# Patient Record
Sex: Female | Born: 1955
Health system: Southern US, Community
[De-identification: ages and names within clinical notes are randomized; demographics above are authoritative.]

## PROBLEM LIST (undated history)

## (undated) DIAGNOSIS — I10 Essential (primary) hypertension: Secondary | ICD-10-CM

## (undated) DIAGNOSIS — J302 Other seasonal allergic rhinitis: Secondary | ICD-10-CM

## (undated) DIAGNOSIS — I471 Supraventricular tachycardia, unspecified: Secondary | ICD-10-CM

## (undated) DIAGNOSIS — E669 Obesity, unspecified: Secondary | ICD-10-CM

## (undated) DIAGNOSIS — E119 Type 2 diabetes mellitus without complications: Secondary | ICD-10-CM

## (undated) DIAGNOSIS — E785 Hyperlipidemia, unspecified: Secondary | ICD-10-CM

## (undated) HISTORY — PX: SHOULDER SURGERY: SHX246

## (undated) HISTORY — DX: Other seasonal allergic rhinitis: J30.2

## (undated) HISTORY — DX: Type 2 diabetes mellitus without complications: E11.9

## (undated) HISTORY — DX: Hyperlipidemia, unspecified: E78.5

## (undated) HISTORY — DX: Obesity, unspecified: E66.9

## (undated) HISTORY — DX: Essential (primary) hypertension: I10

## (undated) HISTORY — DX: Supraventricular tachycardia, unspecified: I47.10

## (undated) HISTORY — DX: Supraventricular tachycardia: I47.1

---

## 2001-04-16 ENCOUNTER — Ambulatory Visit (HOSPITAL_COMMUNITY): Admission: RE | Admit: 2001-04-16 | Discharge: 2001-04-16 | Payer: Self-pay | Admitting: General Surgery

## 2001-04-16 ENCOUNTER — Encounter: Payer: Self-pay | Admitting: Orthopedic Surgery

## 2001-04-16 ENCOUNTER — Encounter: Payer: Self-pay | Admitting: General Surgery

## 2002-06-15 ENCOUNTER — Ambulatory Visit (HOSPITAL_COMMUNITY): Admission: RE | Admit: 2002-06-15 | Discharge: 2002-06-15 | Payer: Self-pay | Admitting: General Surgery

## 2002-06-15 ENCOUNTER — Encounter: Payer: Self-pay | Admitting: General Surgery

## 2002-12-01 HISTORY — PX: ABDOMINAL HYSTERECTOMY: SHX81

## 2003-03-07 ENCOUNTER — Encounter: Payer: Self-pay | Admitting: Family Medicine

## 2003-03-07 ENCOUNTER — Observation Stay (HOSPITAL_COMMUNITY): Admission: RE | Admit: 2003-03-07 | Discharge: 2003-03-08 | Payer: Self-pay | Admitting: Family Medicine

## 2003-03-08 ENCOUNTER — Encounter: Payer: Self-pay | Admitting: *Deleted

## 2003-10-19 ENCOUNTER — Ambulatory Visit (HOSPITAL_COMMUNITY): Admission: RE | Admit: 2003-10-19 | Discharge: 2003-10-19 | Payer: Self-pay | Admitting: Orthopedic Surgery

## 2003-12-02 DIAGNOSIS — E785 Hyperlipidemia, unspecified: Secondary | ICD-10-CM

## 2003-12-02 DIAGNOSIS — I10 Essential (primary) hypertension: Secondary | ICD-10-CM

## 2003-12-02 HISTORY — DX: Hyperlipidemia, unspecified: E78.5

## 2003-12-02 HISTORY — DX: Essential (primary) hypertension: I10

## 2004-06-25 ENCOUNTER — Ambulatory Visit (HOSPITAL_COMMUNITY): Admission: RE | Admit: 2004-06-25 | Discharge: 2004-06-25 | Payer: Self-pay | Admitting: Family Medicine

## 2005-01-13 ENCOUNTER — Ambulatory Visit: Payer: Self-pay | Admitting: Family Medicine

## 2005-01-14 ENCOUNTER — Ambulatory Visit (HOSPITAL_COMMUNITY): Admission: RE | Admit: 2005-01-14 | Discharge: 2005-01-14 | Payer: Self-pay | Admitting: Family Medicine

## 2005-03-13 ENCOUNTER — Ambulatory Visit (HOSPITAL_COMMUNITY): Admission: RE | Admit: 2005-03-13 | Discharge: 2005-03-13 | Payer: Self-pay | Admitting: General Surgery

## 2005-05-05 ENCOUNTER — Ambulatory Visit: Payer: Self-pay | Admitting: Family Medicine

## 2005-07-21 ENCOUNTER — Ambulatory Visit: Payer: Self-pay | Admitting: Family Medicine

## 2005-07-22 ENCOUNTER — Ambulatory Visit (HOSPITAL_COMMUNITY): Admission: RE | Admit: 2005-07-22 | Discharge: 2005-07-22 | Payer: Self-pay | Admitting: Family Medicine

## 2005-08-06 ENCOUNTER — Ambulatory Visit (HOSPITAL_COMMUNITY): Admission: RE | Admit: 2005-08-06 | Discharge: 2005-08-06 | Payer: Self-pay | Admitting: Family Medicine

## 2005-09-15 ENCOUNTER — Ambulatory Visit: Payer: Self-pay | Admitting: Family Medicine

## 2006-02-03 ENCOUNTER — Ambulatory Visit: Payer: Self-pay | Admitting: Family Medicine

## 2006-04-29 ENCOUNTER — Ambulatory Visit (HOSPITAL_COMMUNITY): Admission: RE | Admit: 2006-04-29 | Discharge: 2006-04-29 | Payer: Self-pay | Admitting: Family Medicine

## 2006-04-30 ENCOUNTER — Ambulatory Visit: Payer: Self-pay | Admitting: Family Medicine

## 2006-05-01 ENCOUNTER — Ambulatory Visit (HOSPITAL_COMMUNITY): Admission: RE | Admit: 2006-05-01 | Discharge: 2006-05-01 | Payer: Self-pay | Admitting: Family Medicine

## 2006-06-04 ENCOUNTER — Ambulatory Visit: Payer: Self-pay | Admitting: Family Medicine

## 2006-07-21 ENCOUNTER — Encounter (HOSPITAL_COMMUNITY): Admission: RE | Admit: 2006-07-21 | Discharge: 2006-08-20 | Payer: Self-pay | Admitting: Family Medicine

## 2006-07-24 ENCOUNTER — Ambulatory Visit: Payer: Self-pay | Admitting: Family Medicine

## 2006-10-06 ENCOUNTER — Ambulatory Visit: Payer: Self-pay | Admitting: Family Medicine

## 2006-11-17 ENCOUNTER — Ambulatory Visit: Payer: Self-pay | Admitting: Family Medicine

## 2006-11-18 ENCOUNTER — Ambulatory Visit (HOSPITAL_COMMUNITY): Admission: RE | Admit: 2006-11-18 | Discharge: 2006-11-18 | Payer: Self-pay | Admitting: Family Medicine

## 2007-02-16 ENCOUNTER — Encounter: Payer: Self-pay | Admitting: Family Medicine

## 2007-02-16 LAB — CONVERTED CEMR LAB
ALT: 24 units/L (ref 0–35)
AST: 22 units/L (ref 0–37)
Albumin: 4.4 g/dL (ref 3.5–5.2)
Alkaline Phosphatase: 93 units/L (ref 39–117)
CO2: 24 meq/L (ref 19–32)
Calcium: 9.8 mg/dL (ref 8.4–10.5)
Chloride: 106 meq/L (ref 96–112)
Cholesterol: 272 mg/dL — ABNORMAL HIGH (ref 0–200)
Creatinine, Ser: 0.74 mg/dL (ref 0.40–1.20)
Glucose, Bld: 104 mg/dL — ABNORMAL HIGH (ref 70–99)
HDL: 72 mg/dL (ref >39)
Potassium: 3.8 meq/L (ref 3.5–5.3)
Total Protein: 7.6 g/dL (ref 6.0–8.3)
Triglycerides: 87 mg/dL (ref <150)

## 2007-02-17 ENCOUNTER — Ambulatory Visit: Payer: Self-pay | Admitting: Family Medicine

## 2007-03-29 ENCOUNTER — Ambulatory Visit: Payer: Self-pay | Admitting: Family Medicine

## 2007-03-30 ENCOUNTER — Ambulatory Visit (HOSPITAL_COMMUNITY): Admission: RE | Admit: 2007-03-30 | Discharge: 2007-03-30 | Payer: Self-pay | Admitting: Family Medicine

## 2007-07-07 ENCOUNTER — Encounter: Payer: Self-pay | Admitting: Family Medicine

## 2007-07-07 ENCOUNTER — Ambulatory Visit: Payer: Self-pay | Admitting: Family Medicine

## 2007-07-07 ENCOUNTER — Other Ambulatory Visit: Admission: RE | Admit: 2007-07-07 | Discharge: 2007-07-07 | Payer: Self-pay | Admitting: Family Medicine

## 2007-07-07 LAB — CONVERTED CEMR LAB
ALT: 26 units/L (ref 0–35)
AST: 22 units/L (ref 0–37)
Albumin: 4.5 g/dL (ref 3.5–5.2)
Alkaline Phosphatase: 96 units/L (ref 39–117)
BUN: 14 mg/dL (ref 6–23)
Basophils Absolute: 0.1 10*3/uL (ref 0.0–0.1)
Basophils Relative: 1 % (ref 0–1)
Bilirubin, Direct: 0.1 mg/dL (ref 0.0–0.3)
CO2: 26 meq/L (ref 19–32)
Calcium: 9.7 mg/dL (ref 8.4–10.5)
Chloride: 104 meq/L (ref 96–112)
Cholesterol: 155 mg/dL (ref 0–200)
Creatinine, Ser: 0.92 mg/dL (ref 0.40–1.20)
Eosinophils Absolute: 0.4 10*3/uL (ref 0.0–0.7)
Eosinophils Relative: 4 % (ref 0–5)
Glucose, Bld: 111 mg/dL — ABNORMAL HIGH (ref 70–99)
HCT: 38.6 % (ref 36.0–46.0)
HDL: 64 mg/dL (ref >39)
Hemoglobin: 12.1 g/dL (ref 12.0–15.0)
Indirect Bilirubin: 0.5 mg/dL (ref 0.0–0.9)
LDL Cholesterol: 72 mg/dL (ref 0–99)
Lymphocytes Relative: 34 % (ref 12–46)
Lymphs Abs: 3.1 10*3/uL (ref 0.7–3.3)
MCHC: 31.3 g/dL (ref 30.0–36.0)
MCV: 89.8 fL (ref 78.0–100.0)
Monocytes Absolute: 0.5 10*3/uL (ref 0.2–0.7)
Monocytes Relative: 6 % (ref 3–11)
Neutro Abs: 4.9 10*3/uL (ref 1.7–7.7)
Neutrophils Relative %: 55 % (ref 43–77)
Platelets: 429 10*3/uL — ABNORMAL HIGH (ref 150–400)
Potassium: 3.8 meq/L (ref 3.5–5.3)
RBC: 4.3 M/uL (ref 3.87–5.11)
RDW: 16.1 % — ABNORMAL HIGH (ref 11.5–14.0)
Sodium: 141 meq/L (ref 135–145)
TSH: 2.82 microintl units/mL (ref 0.350–5.50)
Total Bilirubin: 0.6 mg/dL (ref 0.3–1.2)
Total CHOL/HDL Ratio: 2.4
Total Protein: 7.7 g/dL (ref 6.0–8.3)
Triglycerides: 94 mg/dL (ref <150)
VLDL: 19 mg/dL (ref 0–40)
WBC: 8.9 10*3/uL (ref 4.0–10.5)

## 2007-11-10 ENCOUNTER — Ambulatory Visit: Payer: Self-pay | Admitting: Family Medicine

## 2007-12-13 ENCOUNTER — Ambulatory Visit (HOSPITAL_COMMUNITY): Admission: RE | Admit: 2007-12-13 | Discharge: 2007-12-13 | Payer: Self-pay | Admitting: Family Medicine

## 2008-02-08 ENCOUNTER — Ambulatory Visit: Payer: Self-pay | Admitting: Family Medicine

## 2008-02-08 LAB — CONVERTED CEMR LAB
ALT: 18 units/L (ref 0–35)
AST: 18 units/L (ref 0–37)
Albumin: 4.9 g/dL (ref 3.5–5.2)
Alkaline Phosphatase: 112 units/L (ref 39–117)
Calcium: 9.5 mg/dL (ref 8.4–10.5)
Chloride: 102 meq/L (ref 96–112)
Cholesterol: 174 mg/dL (ref 0–200)
Creatinine, Ser: 0.87 mg/dL (ref 0.40–1.20)
HDL: 69 mg/dL (ref >39)
Total Protein: 7.9 g/dL (ref 6.0–8.3)
Triglycerides: 78 mg/dL (ref <150)

## 2008-03-15 ENCOUNTER — Inpatient Hospital Stay (HOSPITAL_COMMUNITY): Admission: EM | Admit: 2008-03-15 | Discharge: 2008-03-17 | Payer: Self-pay | Admitting: Emergency Medicine

## 2008-03-16 ENCOUNTER — Encounter (INDEPENDENT_AMBULATORY_CARE_PROVIDER_SITE_OTHER): Payer: Self-pay | Admitting: Family Medicine

## 2008-03-17 ENCOUNTER — Ambulatory Visit (HOSPITAL_COMMUNITY): Admission: AD | Admit: 2008-03-17 | Discharge: 2008-03-17 | Payer: Self-pay | Admitting: Cardiovascular Disease

## 2008-03-22 ENCOUNTER — Ambulatory Visit: Payer: Self-pay | Admitting: Family Medicine

## 2008-03-28 ENCOUNTER — Encounter: Payer: Self-pay | Admitting: Family Medicine

## 2008-03-29 DIAGNOSIS — J309 Allergic rhinitis, unspecified: Secondary | ICD-10-CM | POA: Insufficient documentation

## 2008-03-29 DIAGNOSIS — I10 Essential (primary) hypertension: Secondary | ICD-10-CM

## 2008-03-30 ENCOUNTER — Encounter: Payer: Self-pay | Admitting: Family Medicine

## 2008-04-04 ENCOUNTER — Ambulatory Visit (HOSPITAL_COMMUNITY): Admission: RE | Admit: 2008-04-04 | Discharge: 2008-04-04 | Payer: Self-pay | Admitting: General Surgery

## 2008-05-12 ENCOUNTER — Ambulatory Visit: Payer: Self-pay | Admitting: Family Medicine

## 2008-05-13 ENCOUNTER — Encounter: Payer: Self-pay | Admitting: Family Medicine

## 2008-08-16 ENCOUNTER — Other Ambulatory Visit: Admission: RE | Admit: 2008-08-16 | Discharge: 2008-08-16 | Payer: Self-pay | Admitting: Family Medicine

## 2008-08-16 ENCOUNTER — Ambulatory Visit: Payer: Self-pay | Admitting: Family Medicine

## 2008-08-16 ENCOUNTER — Encounter: Payer: Self-pay | Admitting: Family Medicine

## 2008-08-16 LAB — CONVERTED CEMR LAB: Pap Smear: NORMAL

## 2008-09-22 ENCOUNTER — Encounter: Payer: Self-pay | Admitting: Family Medicine

## 2008-11-01 ENCOUNTER — Telehealth: Payer: Self-pay | Admitting: Family Medicine

## 2008-12-01 DIAGNOSIS — E119 Type 2 diabetes mellitus without complications: Secondary | ICD-10-CM

## 2008-12-01 HISTORY — DX: Type 2 diabetes mellitus without complications: E11.9

## 2008-12-22 ENCOUNTER — Telehealth: Payer: Self-pay | Admitting: Family Medicine

## 2008-12-22 ENCOUNTER — Encounter: Payer: Self-pay | Admitting: Family Medicine

## 2008-12-22 LAB — CONVERTED CEMR LAB
AST: 20 units/L (ref 0–37)
Alkaline Phosphatase: 98 units/L (ref 39–117)
Bilirubin, Direct: 0.1 mg/dL (ref 0.0–0.3)
CO2: 24 meq/L (ref 19–32)
Calcium: 9.7 mg/dL (ref 8.4–10.5)
Creatinine, Ser: 0.77 mg/dL (ref 0.40–1.20)
Glucose, Bld: 86 mg/dL (ref 70–99)
LDL Cholesterol: 106 mg/dL — ABNORMAL HIGH (ref 0–99)
Total Bilirubin: 0.6 mg/dL (ref 0.3–1.2)
Total CHOL/HDL Ratio: 3

## 2008-12-25 ENCOUNTER — Ambulatory Visit (HOSPITAL_COMMUNITY): Admission: RE | Admit: 2008-12-25 | Discharge: 2008-12-25 | Payer: Self-pay | Admitting: Family Medicine

## 2008-12-25 ENCOUNTER — Ambulatory Visit: Payer: Self-pay | Admitting: Family Medicine

## 2008-12-25 DIAGNOSIS — M25569 Pain in unspecified knee: Secondary | ICD-10-CM

## 2008-12-25 DIAGNOSIS — R5383 Other fatigue: Secondary | ICD-10-CM

## 2008-12-25 DIAGNOSIS — R5381 Other malaise: Secondary | ICD-10-CM | POA: Insufficient documentation

## 2008-12-26 ENCOUNTER — Telehealth: Payer: Self-pay | Admitting: Family Medicine

## 2009-01-22 ENCOUNTER — Telehealth: Payer: Self-pay | Admitting: Family Medicine

## 2009-02-14 ENCOUNTER — Ambulatory Visit: Payer: Self-pay | Admitting: Orthopedic Surgery

## 2009-02-14 DIAGNOSIS — M549 Dorsalgia, unspecified: Secondary | ICD-10-CM | POA: Insufficient documentation

## 2009-02-15 ENCOUNTER — Encounter: Payer: Self-pay | Admitting: Orthopedic Surgery

## 2009-02-28 ENCOUNTER — Ambulatory Visit: Payer: Self-pay | Admitting: Family Medicine

## 2009-03-04 DIAGNOSIS — E785 Hyperlipidemia, unspecified: Secondary | ICD-10-CM | POA: Insufficient documentation

## 2009-03-28 ENCOUNTER — Ambulatory Visit: Payer: Self-pay | Admitting: Orthopedic Surgery

## 2009-05-02 ENCOUNTER — Ambulatory Visit: Payer: Self-pay | Admitting: Family Medicine

## 2009-05-04 LAB — CONVERTED CEMR LAB
ALT: 25 U/L
AST: 16 U/L
Albumin: 4.2 g/dL
Alkaline Phosphatase: 91 U/L
BUN: 12 mg/dL
Bilirubin, Direct: 0.1 mg/dL
CO2: 27 meq/L
Calcium: 9.5 mg/dL
Chloride: 107 meq/L
Cholesterol: 195 mg/dL
Creatinine, Ser: 0.76 mg/dL
Glucose, Bld: 103 mg/dL — ABNORMAL HIGH
HDL: 64 mg/dL
Indirect Bilirubin: 0.3 mg/dL
LDL Cholesterol: 116 mg/dL — ABNORMAL HIGH
Potassium: 4.1 meq/L
Sodium: 144 meq/L
Total Bilirubin: 0.4 mg/dL
Total CHOL/HDL Ratio: 3
Total Protein: 7 g/dL
Triglycerides: 75 mg/dL
VLDL: 15 mg/dL

## 2009-07-04 ENCOUNTER — Ambulatory Visit: Payer: Self-pay | Admitting: Family Medicine

## 2009-07-05 ENCOUNTER — Encounter: Payer: Self-pay | Admitting: Family Medicine

## 2009-07-06 LAB — CONVERTED CEMR LAB
Albumin: 4.3 g/dL (ref 3.5–5.2)
Alkaline Phosphatase: 104 units/L (ref 39–117)
BUN: 13 mg/dL (ref 6–23)
CO2: 27 meq/L (ref 19–32)
Chloride: 103 meq/L (ref 96–112)
Creatinine, Ser: 0.94 mg/dL (ref 0.40–1.20)
Eosinophils Absolute: 0.4 10*3/uL (ref 0.0–0.7)
Eosinophils Relative: 6 % — ABNORMAL HIGH (ref 0–5)
Glucose, Bld: 111 mg/dL — ABNORMAL HIGH (ref 70–99)
HCT: 36.9 % (ref 36.0–46.0)
Hemoglobin: 11.6 g/dL — ABNORMAL LOW (ref 12.0–15.0)
Hgb A1c MFr Bld: 6.7 % — ABNORMAL HIGH (ref 4.6–6.1)
Indirect Bilirubin: 0.4 mg/dL (ref 0.0–0.9)
LDL Cholesterol: 92 mg/dL (ref 0–99)
Lymphocytes Relative: 30 % (ref 12–46)
Lymphs Abs: 2.4 10*3/uL (ref 0.7–4.0)
MCV: 90.9 fL (ref 78.0–100.0)
Monocytes Absolute: 0.5 10*3/uL (ref 0.1–1.0)
Monocytes Relative: 7 % (ref 3–12)
Platelets: 415 10*3/uL — ABNORMAL HIGH (ref 150–400)
Potassium: 4.3 meq/L (ref 3.5–5.3)
RBC: 4.06 M/uL (ref 3.87–5.11)
Total Bilirubin: 0.5 mg/dL (ref 0.3–1.2)
Total Protein: 7.2 g/dL (ref 6.0–8.3)
VLDL: 10 mg/dL (ref 0–40)
WBC: 7.8 10*3/uL (ref 4.0–10.5)

## 2009-07-09 ENCOUNTER — Ambulatory Visit (HOSPITAL_COMMUNITY): Admission: RE | Admit: 2009-07-09 | Discharge: 2009-07-09 | Payer: Self-pay | Admitting: Family Medicine

## 2009-07-11 ENCOUNTER — Encounter: Payer: Self-pay | Admitting: Family Medicine

## 2009-07-12 LAB — CONVERTED CEMR LAB
Glucose, 2 hour: 175 mg/dL — ABNORMAL HIGH (ref 70–139)
Glucose, Fasting: 100 mg/dL — ABNORMAL HIGH (ref 70–99)

## 2009-08-22 ENCOUNTER — Ambulatory Visit: Payer: Self-pay | Admitting: Family Medicine

## 2009-08-22 DIAGNOSIS — E1169 Type 2 diabetes mellitus with other specified complication: Secondary | ICD-10-CM | POA: Insufficient documentation

## 2009-08-22 DIAGNOSIS — E1159 Type 2 diabetes mellitus with other circulatory complications: Secondary | ICD-10-CM | POA: Insufficient documentation

## 2009-10-03 ENCOUNTER — Ambulatory Visit: Payer: Self-pay | Admitting: Family Medicine

## 2009-10-03 DIAGNOSIS — N3000 Acute cystitis without hematuria: Secondary | ICD-10-CM

## 2009-10-03 LAB — CONVERTED CEMR LAB
Glucose, Urine, Semiquant: NEGATIVE
Nitrite: NEGATIVE
Protein, U semiquant: >=300
pH: 7

## 2009-10-08 ENCOUNTER — Encounter: Payer: Self-pay | Admitting: Family Medicine

## 2009-12-19 ENCOUNTER — Telehealth: Payer: Self-pay | Admitting: Family Medicine

## 2009-12-26 ENCOUNTER — Ambulatory Visit: Payer: Self-pay | Admitting: Family Medicine

## 2009-12-26 LAB — CONVERTED CEMR LAB
Glucose, Bld: 115 mg/dL
Hgb A1c MFr Bld: 6.6 %

## 2009-12-27 ENCOUNTER — Encounter: Payer: Self-pay | Admitting: Family Medicine

## 2009-12-27 LAB — CONVERTED CEMR LAB
Albumin: 4.6 g/dL (ref 3.5–5.2)
Alkaline Phosphatase: 104 units/L (ref 39–117)
CO2: 26 meq/L (ref 19–32)
Chloride: 100 meq/L (ref 96–112)
Creatinine, Urine: 153.9 mg/dL
HDL: 63 mg/dL (ref >39)
LDL Cholesterol: 173 mg/dL — ABNORMAL HIGH (ref 0–99)
Microalb Creat Ratio: 3.9 mg/g (ref 0.0–30.0)
Microalb, Ur: 0.6 mg/dL (ref 0.00–1.89)
Potassium: 3.7 meq/L (ref 3.5–5.3)
Sodium: 140 meq/L (ref 135–145)
Total Bilirubin: 0.6 mg/dL (ref 0.3–1.2)
Total CHOL/HDL Ratio: 4.1
Total Protein: 7.6 g/dL (ref 6.0–8.3)
Triglycerides: 102 mg/dL (ref <150)
VLDL: 20 mg/dL (ref 0–40)

## 2010-01-23 ENCOUNTER — Encounter: Admission: RE | Admit: 2010-01-23 | Discharge: 2010-01-23 | Payer: Self-pay | Admitting: Family Medicine

## 2010-03-01 ENCOUNTER — Ambulatory Visit (HOSPITAL_COMMUNITY): Admission: RE | Admit: 2010-03-01 | Discharge: 2010-03-01 | Payer: Self-pay | Admitting: Family Medicine

## 2010-03-12 ENCOUNTER — Encounter: Payer: Self-pay | Admitting: Family Medicine

## 2010-03-29 ENCOUNTER — Telehealth: Payer: Self-pay | Admitting: Family Medicine

## 2010-05-14 ENCOUNTER — Encounter: Admission: RE | Admit: 2010-05-14 | Discharge: 2010-05-14 | Payer: Self-pay | Admitting: Family Medicine

## 2010-07-10 ENCOUNTER — Encounter: Payer: Self-pay | Admitting: Family Medicine

## 2010-07-22 ENCOUNTER — Ambulatory Visit: Payer: Self-pay | Admitting: Family Medicine

## 2010-07-22 DIAGNOSIS — M949 Disorder of cartilage, unspecified: Secondary | ICD-10-CM

## 2010-07-22 DIAGNOSIS — M899 Disorder of bone, unspecified: Secondary | ICD-10-CM | POA: Insufficient documentation

## 2010-07-23 LAB — CONVERTED CEMR LAB
ALT: 24 units/L (ref 0–35)
AST: 18 units/L (ref 0–37)
Albumin: 4.4 g/dL (ref 3.5–5.2)
Basophils Absolute: 0 10*3/uL (ref 0.0–0.1)
CO2: 30 meq/L (ref 19–32)
Calcium: 9.7 mg/dL (ref 8.4–10.5)
Cholesterol: 157 mg/dL (ref 0–200)
Eosinophils Relative: 4 % (ref 0–5)
HCT: 38.7 % (ref 36.0–46.0)
HDL: 56 mg/dL (ref >39)
Lymphocytes Relative: 39 % (ref 12–46)
Neutrophils Relative %: 51 % (ref 43–77)
Platelets: 422 10*3/uL — ABNORMAL HIGH (ref 150–400)
Potassium: 3.8 meq/L (ref 3.5–5.3)
RDW: 15.4 % (ref 11.5–15.5)
Sodium: 142 meq/L (ref 135–145)
TSH: 1.545 microintl units/mL (ref 0.350–4.500)
Total CHOL/HDL Ratio: 2.8

## 2010-07-24 ENCOUNTER — Telehealth: Payer: Self-pay | Admitting: Family Medicine

## 2010-08-16 ENCOUNTER — Telehealth: Payer: Self-pay | Admitting: Family Medicine

## 2010-08-20 ENCOUNTER — Encounter: Admission: RE | Admit: 2010-08-20 | Discharge: 2010-08-29 | Payer: Self-pay | Admitting: Family Medicine

## 2010-10-07 ENCOUNTER — Emergency Department (HOSPITAL_COMMUNITY): Admission: EM | Admit: 2010-10-07 | Discharge: 2010-10-07 | Payer: Self-pay | Admitting: Emergency Medicine

## 2010-10-28 ENCOUNTER — Ambulatory Visit: Payer: Self-pay | Admitting: Family Medicine

## 2010-10-29 ENCOUNTER — Encounter: Payer: Self-pay | Admitting: Family Medicine

## 2010-10-29 DIAGNOSIS — R079 Chest pain, unspecified: Secondary | ICD-10-CM

## 2010-10-30 ENCOUNTER — Ambulatory Visit: Payer: Self-pay | Admitting: Cardiology

## 2010-10-30 ENCOUNTER — Encounter: Payer: Self-pay | Admitting: Family Medicine

## 2010-10-30 LAB — CONVERTED CEMR LAB
ALT: 30 units/L (ref 0–35)
AST: 22 units/L (ref 0–37)
BUN: 11 mg/dL (ref 6–23)
Bilirubin, Direct: 0.1 mg/dL (ref 0.0–0.3)
Calcium: 9.9 mg/dL (ref 8.4–10.5)
Cholesterol: 150 mg/dL (ref 0–200)
Creatinine, Ser: 0.82 mg/dL (ref 0.40–1.20)
Glucose, Bld: 103 mg/dL — ABNORMAL HIGH (ref 70–99)
Indirect Bilirubin: 0.5 mg/dL (ref 0.0–0.9)

## 2010-10-31 ENCOUNTER — Encounter: Payer: Self-pay | Admitting: Cardiology

## 2010-11-27 ENCOUNTER — Encounter: Payer: Self-pay | Admitting: Family Medicine

## 2010-11-27 ENCOUNTER — Telehealth (INDEPENDENT_AMBULATORY_CARE_PROVIDER_SITE_OTHER): Payer: Self-pay | Admitting: *Deleted

## 2010-11-28 ENCOUNTER — Encounter: Payer: Self-pay | Admitting: Family Medicine

## 2010-12-21 ENCOUNTER — Encounter: Payer: Self-pay | Admitting: Family Medicine

## 2010-12-22 ENCOUNTER — Encounter: Payer: Self-pay | Admitting: Family Medicine

## 2011-01-02 NOTE — Progress Notes (Signed)
Summary: rx sent over to [facility]  Phone Note Call from Patient   Summary of Call: has signed up fpr medlink need for you to fax over all her rx to outpatient phar at [facility] they will have it when she goes over there friday you can call her at [phone] Initial call taken by: Lind Guest,  December 19, 2009 1:00 PM  Follow-up for Phone Call        wants to know if avalide can be changed to a generic? Follow-up by: Worthy Keeler LPN,  December 19, 2009 3:36 PM  Additional Follow-up for Phone Call Additional follow up Details #1::        pls send over meds to the out pt pharmacy as requested, for the avalide lotensin 40mg  and HCTZ 25mg  can be substituted one daily of each, wecan try that, send in script for that also if she wishes to try that in place of theavalide Additional Follow-up by: Syliva Overman MD,  December 20, 2009 12:08 PM    Additional Follow-up for Phone Call Additional follow up Details #2::    rx's sent and patient aware of change Follow-up by: Worthy Keeler LPN,  December 20, 2009 3:16 PM  New/Updated Medications: LOTENSIN 40 MG TABS (BENAZEPRIL HCL) one tab by mouth qd HYDROCHLOROTHIAZIDE 25 MG TABS (HYDROCHLOROTHIAZIDE) one tab by mouth qd Prescriptions: CALTRATE 600+D 600-400 MG-UNIT TABS (CALCIUM CARBONATE-VITAMIN D) Take 1 tablet by mouth two times a day  #180 x 0   Entered by:   Worthy Keeler LPN   Authorized by:   Syliva Overman MD   Signed by:   Worthy Keeler LPN on 78/29/5621   Method used:   Electronically to        [facility]* (retail)       [address]       [address] Shipping/mailing       [place], [place]  [address]       Ph: [phone]       Fax: [phone]   RxID:   [id number] CLONIDINE HCL 0.3 MG TABS (CLONIDINE HCL) Take 1 tab by mouth at bedtime  #90 x 0   Entered by:   Worthy Keeler LPN   Authorized by:   Syliva Overman MD   Signed by:   Worthy Keeler LPN on 95/63/8756   Method used:    Electronically to        [facility]* (retail)       [address]       [address] Shipping/mailing       [place], [place]  [address]       Ph: [phone]       Fax: [phone]   RxID:   [id number] AMLODIPINE BESYLATE 10 MG TABS (AMLODIPINE BESYLATE) Take 1 tablet by mouth once a day  #90 x 0   Entered by:   Worthy Keeler LPN   Authorized by:   Syliva Overman MD   Signed by:   Worthy Keeler LPN on 37/62/8315   Method used:   Electronically to        [facility]* (retail)       [address]       [address] Shipping/mailing       [place], [place]  [address]       Ph: [phone]       Fax: [phone]   RxID:  [id number] VYTORIN 10-80 MG  TABS (EZETIMIBE-SIMVASTATIN) Take 1 tab by mouth at bedtime  #90 x 0   Entered by:   Worthy Keeler LPN   Authorized by:   Syliva Overman MD   Signed by:   Worthy Keeler LPN on 56/21/3086   Method used:   Electronically to        [facility]* (retail)       [address]       [address] Shipping/mailing       [place], [place]  [address]       Ph: [phone]       Fax: [phone]   RxID:   [id number] FUROSEMIDE 20 MG  TABS (FUROSEMIDE) Take 1 tablet by mouth once a day as needed  #90 x 0   Entered by:   Worthy Keeler LPN   Authorized by:   Syliva Overman MD   Signed by:   Worthy Keeler LPN on 87/56/4332   Method used:   Electronically to        [facility]* (retail)       [address]       [address] Shipping/mailing       [place], [place]  [address]       Ph: [phone]       Fax: [phone]   RxID:   [id number] PROTONIX 40 MG  TBEC (PANTOPRAZOLE SODIUM) Take 1 tablet by mouth once a day  #90 x 0   Entered by:   Worthy Keeler LPN   Authorized by:   Syliva Overman MD   Signed by:   Worthy Keeler LPN on 83/15/1761   Method used:   Electronically to        [facility]* (retail)       [address]       [address] Shipping/mailing       [place], [place]  [address]       Ph: [phone]       Fax: [phone]   RxID:   [id number] ASPIRIN EC 81 MG  TBEC (ASPIRIN) Take 1 tablet by mouth once a day  #90 x 0   Entered by:   Worthy Keeler LPN   Authorized by:   Syliva Overman MD   Signed by:   Worthy Keeler LPN on 81/12/7508   Method used:   Electronically to        [facility]* (retail)       [address]       [address] Shipping/mailing       [place], [place]  [address]       Ph: [phone]       Fax: [phone]   RxID:   [id number] HYDROCHLOROTHIAZIDE 25 MG TABS (HYDROCHLOROTHIAZIDE) one tab by mouth qd  #90 x 0   Entered by:   Worthy Keeler LPN   Authorized by:   Syliva Overman MD   Signed by:   Worthy Keeler LPN on 80/99/8338   Method used:   Electronically to        [facility]* (retail)       [address]       [address] Shipping/mailing       [place], [place]  [address]       Ph: [phone]       Fax: [phone]   RxID:  [id number] LOTENSIN 40 MG TABS (BENAZEPRIL HCL) one tab by mouth qd  #90 x 0   Entered by:   Worthy Keeler LPN   Authorized by:   Syliva Overman MD   Signed by:   Worthy Keeler LPN on 13/24/4010   Method used:   Electronically to        [facility]* (retail)       [address]       [address] Shipping/mailing       [place], [place]  [address]       Ph: [phone]       Fax: [phone]   RxID:   [id number]

## 2011-01-02 NOTE — Letter (Signed)
Summary: nutrition & diabetes management  nutrition & diabetes management   Imported By: Lind Guest 11/28/2010 11:35:05  _____________________________________________________________________  External Attachment:    Type:   Image     Comment:   External Document

## 2011-01-02 NOTE — Assessment & Plan Note (Signed)
Summary: **per Dr.McGough for chest pain/tg   Visit Type:  Initial Consult Primary Provider:  Dr. Syliva Overman   History of Present Illness: 55 year old woman referred for cardiology consultation. She has a long-standing history of episodic chest discomfort dating back for several years. Records indicate that she underwent a cardiac catheterization with [facility] back in 2009, showing normal coronary arteries at that time.  She states that she experiences nocturnal sweats and hot flashes fairly regularly, sometimes associated with a discomfort under her breasts, to the mid chest. No clear precipitant for these symptoms, typically nonexertional. Sometimes she drinks something thinking that it may be related to indigestion. She is however on protonix. Sometimes her symptoms last much longer, up to a half a day described as a "nagging" discomfort. She states she underwent an endoscopy perhaps 10 years ago with findings of a "peptic ulcer."  She has been diagnosed with obstructive sleep apnea and uses CPAP at night. She states that this intervention has helped some of her symptoms, specifically she is not breathless and does not have to sit up at night time.  Since her episode of chest discomfort and hospital evaluation back on the seventh of this month, she reports fewer symptoms. She has undergone some medication adjustments and thinks that this may be contributing. I reviewed her recent testing, outlined below.  Current Medications (verified): 1)  Aspirin Ec 81 Mg  Tbec (Aspirin) .... Take 1 Tablet By Mouth Once A Day 2)  Protonix 40 Mg  Tbec (Pantoprazole Sodium) .... Take 1 Tablet By Mouth Once A Day 3)  Furosemide 20 Mg  Tabs (Furosemide) .... Take 1 Tablet By Mouth Once A Day As Needed 4)  Colace 100 Mg  Caps (Docusate Sodium) .... Take Two Tablets By Mouth Four Times Daily As Needed 5)  Amlodipine Besylate 10 Mg Tabs (Amlodipine Besylate) .... Take 1 Tablet By Mouth  Once A Day 6)  Clonidine Hcl 0.3 Mg Tabs (Clonidine Hcl) .... Take 1 Tab By Mouth At Bedtime 7)  Caltrate 600+d 600-400 Mg-Unit Tabs (Calcium Carbonate-Vitamin D) .... Take 1 Tablet By Mouth Two Times A Day 8)  Lotensin 40 Mg Tabs (Benazepril Hcl) .... One Tab By Mouth Qd 9)  Hydrochlorothiazide 25 Mg Tabs (Hydrochlorothiazide) .... One Tab By Mouth Qd 10)  Crestor 40 Mg Tabs (Rosuvastatin Calcium) .... One Tab By Mouth At Bedtime 11)  Ibuprofen 600 Mg Tabs (Ibuprofen) .... One Tab By Mouth Four Times Once Daily For Pain 12)  Methocarbamol 750 Mg Tabs (Methocarbamol) .... Two Tabs By Mouth Four Times Daily 13)  Septra Ds 800-160 Mg Tabs (Sulfamethoxazole-Trimethoprim) .... Take 1 Tablet By Mouth Two Times A Day 14)  Fluconazole 150 Mg Tabs (Fluconazole) .... Take 1 Tablet By Mouth Once A Day As Needed For Vaginal Itch 15)  Janumet 50-1000 Mg Tabs (Sitagliptin-Metformin Hcl) .... Take 1 Tablet By Mouth Two Times A Day  Allergies: 1)  ! * Nolamine 2)  ! Celebrex 3)  ! 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 she works at the [employer]  Past History:  Family History: Last updated: 10/30/2010 2 living children One deceased child Mother: Living with heart disease Father: Living with heart disease One brother deceased secondary to lung cancer  Social History: Last updated: 10/30/2010 Employed Married Never Smoked Alcohol use - yes Drug use - no  Past Medical History: Normal coronary arteries at catheterization 4/09 [facility]) Fractured ankle Fractured left foot Hyperlipidemia Impaired  glucose tolerance Allergic rhinitis Obesity [facility]  Past Surgical History: Hysterectomy total 1999 Oophorectomy left  1999 Arthroscopic surgery left shoulder 1995 Repair right knee ligament C-Section 1984  Family History: 2 living children One deceased child Mother: Living with heart disease Father: Living with heart disease One  brother deceased secondary to lung cancer  Social History: Employed Married Never Smoked Alcohol use - yes Drug use - no  Review of Systems  The patient denies anorexia, fever, weight loss, dyspnea on exertion, peripheral edema, prolonged cough, hemoptysis, melena, and hematochezia.         Otherwise reviewed and negative except as outlined.  Vital Signs:  Patient profile:   55 year old female Menstrual status:  hysterectomy Weight:      257 pounds BMI:     45.69 Pulse rate:   97 / minute BP sitting:   150 / 90  (right arm)  Vitals Entered By: Dreama Saa, CNA (October 30, 2010 3:25 PM)  Physical Exam  Additional Exam:  Morbidly obese woman in no acute distress. HEENT: Strabismus noted, oropharynx clear with moist mucosa. Neck: Supple, no elevated JVP or bruits. Lungs: Clear to auscultation, nonlabored. Cardiac: Distant, regular heart sounds, 2/6 systolic murmur at the base, preserved second heart sound, no S3 gallop. Abdomen: Obese, nontender, bowel sounds present, no bruits. Skin: Warm and dry. Musculoskeletal: No kyphosis. Extremities: No pitting edema. Neuropsychiatric: Alert and oriented x3, affect appropriate   Cardiac Cath  Procedure date:  03/17/2008  Findings:      HEMODYNAMIC DATA:  Central aortic pressure was 106/70.  Left ventricular   pressure was 106/6.      ANGIOGRAPHIC DATA:  Left main coronary artery was angiographically   normal and bifurcated into an LAD and left circumflex system.      The LAD gave rise to two proximal diagonal vessels, moderate-sized   septal perforating artery and an additional mid diagonal vessel.  The   LAD wrapped around the LV apex and was angiographically normal.      The circumflex vessel was angiographically normal, gave rise to one   major marginal vessel.      The right coronary was angiographically normal, gave rise to the PDA   vessel and very small posterolateral system.      RAO ventriculography  revealed normal global contractility without focal   segmental wall motion abnormalities.  Ejection fraction is greater than   55%.      Distal aortography revealed a normal aortoiliac system.  There was no   evidence for renal artery stenosis.  Echocardiogram  Procedure date:  03/16/2008  Findings:       SUMMARY   -  Overall left ventricular systolic function was normal. There were         no left ventricular regional wall motion abnormalities.   -  There was mild mitral valvular regurgitation.   -  The left atrium was mild to moderately dilated.   -  The right ventricle was mildly dilated. There was mild right         ventricular hypertrophy.  CT Scan  Procedure date:  10/07/2010  Findings:      IMPRESSION:    1.  No evidence of pulmonary embolism.   2.  No acute cardiopulmonary disease.   3.  Diffuse hepatic steatosis.   Venous Doppler  Procedure date:  10/07/2010  Findings:      IMPRESSION:   No evidence of DVT involving either the right  or left lower   extremity.  Nuclear Study  Procedure date:  03/08/2003  Findings:      IMPRESSION:  1.  NO EVIDENCE  FOR LEFT VENTRICULAR MYOCARDIAL INFARCT OR ISCHEMIA.  2.  NORMAL LEFT VENTRICULAR EJECTION FRACTION.  EKG  Procedure date:  10/30/2010  Findings:      Sinus rhythm at 88 beats per minute with PR interval of 250 ms, left anterior fascicular block, left ventricular hypertrophy, left atrial enlargement. Decreased R-wave progression across the precordium. Old changes based on prior tracings.  Impression & Recommendations:  Problem # 1:  CHEST PAIN UNSPECIFIED (ICD-786.50)  Atypical in description, mainly nocturnal and associated with sweats and hot flashes. She has had similar symptoms intermittently for years, and had a normal cardiac catheterization in April of 2009. ECG is abnormal at baseline, however chronically so. She does not report any clear exertional symptoms that are reproducible. We discussed  the issues today. She states she is beginning to feel better following some medication adjustments made recently. It would not be unreasonable to consider a followup Lexiscan Myoview if symptoms persist or progress, however at this point she is most comfortable with observation. She plans to follow up with Dr. Lodema Hong over the next 3 months. If her symptoms worsen in the interim, we can certainly see her back and discuss further evaluation. One wonders about the possibility of a gastrointestinal etiology such as esophageal spasm, progressive reflux, or even ulcer disease. Gastroenterology consultation may need to be considered at some point.  Her updated medication list for this problem includes:    Aspirin Ec 81 Mg Tbec (Aspirin) .Marland Kitchen... Take 1 tablet by mouth once a day    Amlodipine Besylate 10 Mg Tabs (Amlodipine besylate) .Marland Kitchen... Take 1 tablet by mouth once a day    Lotensin 40 Mg Tabs (Benazepril hcl) ..... One tab by mouth qd  Problem # 2:  DIABETES MELLITUS, TYPE II (ICD-250.00)  Followed by Dr. Lodema Hong.  Her updated medication list for this problem includes:    Aspirin Ec 81 Mg Tbec (Aspirin) .Marland Kitchen... Take 1 tablet by mouth once a day    Lotensin 40 Mg Tabs (Benazepril hcl) ..... One tab by mouth qd    Janumet 50-1000 Mg Tabs (Sitagliptin-metformin hcl) .Marland Kitchen... Take 1 tablet by mouth two times a day  Problem # 3:  HYPERTENSION (ICD-401.9)  Blood pressure elevated today.  Her updated medication list for this problem includes:    Aspirin Ec 81 Mg Tbec (Aspirin) .Marland Kitchen... Take 1 tablet by mouth once a day    Furosemide 20 Mg Tabs (Furosemide) .Marland Kitchen... Take 1 tablet by mouth once a day as needed    Amlodipine Besylate 10 Mg Tabs (Amlodipine besylate) .Marland Kitchen... Take 1 tablet by mouth once a day    Clonidine Hcl 0.3 Mg Tabs (Clonidine hcl) .Marland Kitchen... Take 1 tab by mouth at bedtime    Lotensin 40 Mg Tabs (Benazepril hcl) ..... One tab by mouth qd    Hydrochlorothiazide 25 Mg Tabs (Hydrochlorothiazide) ..... One  tab by mouth qd  Patient Instructions: 1)  Your physician recommends that you schedule a follow-up appointment in: as needed  2)  Your physician recommends that you continue on your current medications as directed. Please refer to the Current Medication list given to you today.

## 2011-01-02 NOTE — Assessment & Plan Note (Signed)
Summary: F UP   Vital Signs:  Patient profile:   55 year old female Menstrual status:  hysterectomy Height:      63 inches Weight:      254 pounds BMI:     45.16 O2 Sat:      98 % Pulse rate:   75 / minute Pulse rhythm:   regular Resp:     16 per minute BP sitting:   120 / 82  (left arm) Cuff size:   large  Vitals Entered By: Everitt Amber LPN (July 22, 2010 8:34 AM)  Nutrition Counseling: Patient's BMI is greater than 25 and therefore counseled on weight management options. CC: PAtient said she was called to come in per Dr. Lodema Hong and she doesn't know why    CC:  PAtient said she was called to come in per Dr. Lodema Hong and she doesn't know why .  History of Present Illness: Reports  that she has been doing fairly well. She has recently started exercisae on avg 2 times/week, and she is somewhat dicouraged that she has been unable to lose weight. Denies recent fever or chills. Denies sinus pressure, nasal congestion , ear pain or sore throat. Denies chest congestion, or cough productive of sputum. Denies chest pain, palpitations, PND, orthopnea or leg swelling. Denies abdominal pain, nausea, vomitting, diarrhea or constipation. Denies change in bowel movements or bloody stool. Denies dysuria , frequency, incontinence or hesitancy. Denies  joint pain, swelling, or reduced mobility. Denies headaches, vertigo, seizures. Denies depression, anxiety or insomnia. Denies  rash, lesions, or itch.     Current Medications (verified): 1)  Aspirin Ec 81 Mg  Tbec (Aspirin) .... Take 1 Tablet By Mouth Once A Day 2)  Protonix 40 Mg  Tbec (Pantoprazole Sodium) .... Take 1 Tablet By Mouth Once A Day 3)  Furosemide 20 Mg  Tabs (Furosemide) .... Take 1 Tablet By Mouth Once A Day As Needed 4)  Colace 100 Mg  Caps (Docusate Sodium) .... Take Two Tablets By Mouth Four Times Daily As Needed 5)  Metformin Hcl 1000 Mg Tabs (Metformin Hcl) .... Take 1 Tablet By Mouth Two Times A Day 6)  Amlodipine  Besylate 10 Mg Tabs (Amlodipine Besylate) .... Take 1 Tablet By Mouth Once A Day 7)  Clonidine Hcl 0.3 Mg Tabs (Clonidine Hcl) .... Take 1 Tab By Mouth At Bedtime 8)  Caltrate 600+d 600-400 Mg-Unit Tabs (Calcium Carbonate-Vitamin D) .... Take 1 Tablet By Mouth Two Times A Day 9)  Phentermine Hcl 37.5 Mg Caps (Phentermine Hcl) .... Take 1 Tablet By Mouth Once A Day 10)  Lotensin 40 Mg Tabs (Benazepril Hcl) .... One Tab By Mouth Qd 11)  Hydrochlorothiazide 25 Mg Tabs (Hydrochlorothiazide) .... One Tab By Mouth Qd 12)  Metformin Hcl 1000 Mg Tabs (Metformin Hcl) .... ,take 1 Tablet By Mouth Two Times A Day 13)  Vytorin 10-80 Mg Tabs (Ezetimibe-Simvastatin) .... Take 1 Tab By Mouth At Bedtime  Allergies (verified): 1)  ! * Nolamine 2)  ! Celebrex 3)  ! * Sklexain  Review of Systems      See HPI General:  Complains of fatigue. Eyes:  Denies blurring and discharge. Endo:  Denies cold intolerance, excessive hunger, excessive thirst, and heat intolerance; test once daily and fastings avg 90 to 120. Heme:  Denies abnormal bruising and bleeding. Allergy:  Complains of seasonal allergies.  Physical Exam  General:  Well-developed,obese iin no acute distress; alert,appropriate and cooperative throughout examination HEENT: No facial asymmetry,  EOMI, No  sinus tenderness, TM's Clear, oropharynx  pink and moist.   Chest: Clear to auscultation bilaterally.  CVS: S1, S2, No murmurs, No S3.   Abd: Soft, Nontender.  MS: Adequate ROM spine, hips, shoulders and knees.  Ext: No edema.   CNS: CN 2-12 intact, power tone and sensation normal throughout.   Skin: Intact, no visible lesions or rashes.  Psych: Good eye contact, normal affect.  Memory intact, not anxious or depressed appearing.   Diabetes Management Exam:    Foot Exam (with socks and/or shoes not present):       Sensory-Monofilament:          Left foot: normal          Right foot: normal       Inspection:          Left foot: normal           Right foot: normal       Nails:          Left foot: normal          Right foot: normal   Impression & Recommendations:  Problem # 1:  DIABETES MELLITUS, TYPE II (ICD-250.00) Assessment Comment Only  Her updated medication list for this problem includes:    Aspirin Ec 81 Mg Tbec (Aspirin) .Marland Kitchen... Take 1 tablet by mouth once a day    Metformin Hcl 1000 Mg Tabs (Metformin hcl) .Marland Kitchen... Take 1 tablet by mouth two times a day    Lotensin 40 Mg Tabs (Benazepril hcl) ..... One tab by mouth qd    Metformin Hcl 1000 Mg Tabs (Metformin hcl) .Marland Kitchen... ,take 1 tablet by mouth two times a day  Orders: T- Hemoglobin A1C ([id number])  Labs Reviewed: Creat: 0.86 (12/26/2009)    Reviewed HgBA1c results: 6.6 (12/26/2009)  6.9 (10/03/2009)  Problem # 2:  HYPERLIPIDEMIA (ICD-272.4) Assessment: Comment Only  The following medications were removed from the medication list:    Crestor 40 Mg Tabs (Rosuvastatin calcium) .Marland Kitchen... Take 1 tab by mouth at bedtime    Vytorin 10-80 Mg Tabs (Ezetimibe-simvastatin) .Marland Kitchen... Take 1 tab by mouth at bedtime  Orders: T-Hepatic Function [phone]) T-Lipid Profile [phone]) new script for cresatror based on l;abwk  Labs Reviewed: SGOT: 21 (12/26/2009)   SGPT: 25 (12/26/2009)   HDL:63 (12/26/2009), 62 (07/04/2009)  LDL:173 (12/26/2009), 92 (29/52/8413)  Chol:256 (12/26/2009), 164 (07/04/2009)  Trig:102 (12/26/2009), 52 (07/04/2009)  Problem # 3:  OBESITY (ICD-278.00) Assessment: Unchanged  Ht: 63 (07/22/2010)   Wt: 254 (07/22/2010)   BMI: 45.16 (07/22/2010)  Problem # 4:  OBESITY (ICD-278.00)  Complete Medication List: 1)  Aspirin Ec 81 Mg Tbec (Aspirin) .... Take 1 tablet by mouth once a day 2)  Protonix 40 Mg Tbec (Pantoprazole sodium) .... Take 1 tablet by mouth once a day 3)  Furosemide 20 Mg Tabs (Furosemide) .... Take 1 tablet by mouth once a day as needed 4)  Colace 100 Mg Caps (Docusate sodium) .... Take two tablets by mouth four times daily as  needed 5)  Metformin Hcl 1000 Mg Tabs (Metformin hcl) .... Take 1 tablet by mouth two times a day 6)  Amlodipine Besylate 10 Mg Tabs (Amlodipine besylate) .... Take 1 tablet by mouth once a day 7)  Clonidine Hcl 0.3 Mg Tabs (Clonidine hcl) .... Take 1 tab by mouth at bedtime 8)  Caltrate 600+d 600-400 Mg-unit Tabs (Calcium carbonate-vitamin d) .... Take 1 tablet by mouth two times a day 9)  Phentermine Hcl 37.5 Mg Caps (Phentermine hcl) .Marland KitchenMarland KitchenMarland Kitchen  Take 1 tablet by mouth once a day 10)  Lotensin 40 Mg Tabs (Benazepril hcl) .... One tab by mouth qd 11)  Hydrochlorothiazide 25 Mg Tabs (Hydrochlorothiazide) .... One tab by mouth qd 12)  Metformin Hcl 1000 Mg Tabs (Metformin hcl) .... ,take 1 tablet by mouth two times a day  Other Orders: T-Basic Metabolic Panel [phone]) T-CBC w/Diff [phone]) T-TSH [phone]) T-Vitamin D (25-Hydroxy) [phone])  Patient Instructions: 1)  CPE in 3 months. 2)  It is important that you exercise regularly at least 30 minutes 5 times a week. If you develop chest pain, have severe difficulty breathing, or feel very tired , stop exercising immediately and seek medical attention. 3)  You need to lose weight. Consider a lower calorie diet and regular exercise.  4)  Check your blood sugars regularly. If your readings are usually above 250 or below 70 you should contact our office. 5)  Your BP is great. 6)    7)  BMP prior to visit, ICD-9: 8)  Hepatic Panel prior to visit, ICD-9: 9)  Lipid Panel prior to visit, ICD-9: 10)  TSH prior to visit, ICD-9:   fasting today 11)  CBC w/ Diff prior to visit, ICD-9: 12)  HbgA1C prior to visit, ICD-9: 13)  Vitamin D

## 2011-01-02 NOTE — Progress Notes (Signed)
Summary: medicine  Phone Note Call from Patient   Summary of Call: wants someone to call in her medicine that was changed when she was here th other day and let her know call back at [phone] Initial call taken by: Lind Guest,  July 24, 2010 11:34 AM  Follow-up for Phone Call        returned call, no answer Follow-up by: Adella Hare LPN,  July 24, 2010 11:36 AM  Additional Follow-up for Phone Call Additional follow up Details #1::        i checked ov but do not see a med change Additional Follow-up by: Adella Hare LPN,  July 24, 2010 3:05 PM    Additional Follow-up for Phone Call Additional follow up Details #2::    wason vytorin 10/80, this needs to be stopped  pls send in crestor 40mg  one  at bedtime x 3 months supply refill x 1to her pharmacy, the simva in vytorin cannot be taken with the amlodipine that is why pls explain to her Follow-up by: Syliva Overman MD,  July 24, 2010 4:57 PM  Additional Follow-up for Phone Call Additional follow up Details #3:: Details for Additional Follow-up Action Taken: rx changed, called patient, left message Additional Follow-up by: Adella Hare LPN,  July 25, 2010 11:16 AM  New/Updated Medications: CRESTOR 40 MG TABS (ROSUVASTATIN CALCIUM) one tab by mouth at bedtime Prescriptions: CRESTOR 40 MG TABS (ROSUVASTATIN CALCIUM) one tab by mouth at bedtime  #90 x 1   Entered by:   Adella Hare LPN   Authorized by:   Syliva Overman MD   Signed by:   Adella Hare LPN on 11/91/4782   Method used:   Electronically to        [facility]* (retail)       [address]       [address] Shipping/mailing       [place], [place]  [address]       Ph: [phone]       Fax: [phone]   RxID:   [id number]  CALLED PATIENT, LEFT MESSAGE Adella Hare LPN  July 26, 2010 2:34 PM

## 2011-01-02 NOTE — Letter (Signed)
Summary: [facility]  [facility]   Imported By: Lind Guest 03/21/2010 08:29:27  _____________________________________________________________________  External Attachment:    Type:   Image     Comment:   External Document

## 2011-01-02 NOTE — Progress Notes (Signed)
  Phone Note From Pharmacy   Caller: [facility]* Summary of Call: wants refill on phentermine sent to [facility] outpatient pharm Initial call taken by: Adella Hare LPN,  August 16, 2010 2:04 PM  Follow-up for Phone Call        refill x 1 Follow-up by: Syliva Overman MD,  August 16, 2010 2:14 PM    Prescriptions: PHENTERMINE HCL 37.5 MG CAPS (PHENTERMINE HCL) Take 1 tablet by mouth once a day  #30 x 0   Entered by:   Adella Hare LPN   Authorized by:   Syliva Overman MD   Signed by:   Adella Hare LPN on 82/95/6213   Method used:   Printed then faxed to ...       [facility]* (retail)       [address]       [address] Shipping/mailing       [place], [place]  [address]       Ph: [phone]       Fax: [phone]   RxID:   [id number]

## 2011-01-02 NOTE — Letter (Signed)
Summary: [facility]   Imported By: Lind Guest 07/10/2010 10:49:50  _____________________________________________________________________  External Attachment:    Type:   Image     Comment:   External Document

## 2011-01-02 NOTE — Progress Notes (Signed)
Summary: request for labs to be faxed  Phone Note Call from Patient   Summary of Call: patient needs her lab results faxed to [phone] and [facility] Initial call taken by: Curtis Sites,  November 27, 2010 3:29 PM  Follow-up for Phone Call        faxed over labs. Follow-up by: Curtis Sites,  November 28, 2010 1:33 PM

## 2011-01-02 NOTE — Miscellaneous (Signed)
  Clinical Lists Changes     Pt's physical exam at ov of 11/28 was consitent with sinusitis , she was prescribed a course of antibiotics

## 2011-01-02 NOTE — Assessment & Plan Note (Signed)
Summary: office visit   Vital Signs:  Patient profile:   55 year old female Menstrual status:  hysterectomy Height:      63 inches Weight:      251.75 pounds BMI:     44.76 O2 Sat:      96 % on Room air Pulse rate:   78 / minute Pulse rhythm:   regular Resp:     16 per minute BP sitting:   132 / 90  (left arm)  Vitals Entered By: Adella Hare LPN (October 28, 2010 10:21 AM)  Nutrition Counseling: Patient's BMI is greater than 25 and therefore counseled on weight management options.  O2 Flow:  Room air CC: follow-up visit Is Patient Diabetic? Yes Did you bring your meter with you today? No Pain Assessment Patient in pain? no        CC:  follow-up visit.  History of Present Illness: Pt was seen in [facility] Nov 7, for acute chest pain radiating to neck and back accompanied by nausea and diaphoresis, the pain woke her up, first and only episode to date. Denies exertional fatigue or chest pain with activity, had a neg cath last year reportedly. She does have multiple cardiac risk factors and has a cardiology f/u scheduled in the next 2 days. She has been more physically active and denies exertional chest discomfort, so i do suspect that this was gI related discomfort. she c/o maxillary pressure and thick drainage with sore neck glands for months.she has intermittent chills and often experiences sweats.  Current Medications (verified): 1)  Aspirin Ec 81 Mg  Tbec (Aspirin) .... Take 1 Tablet By Mouth Once A Day 2)  Protonix 40 Mg  Tbec (Pantoprazole Sodium) .... Take 1 Tablet By Mouth Once A Day 3)  Furosemide 20 Mg  Tabs (Furosemide) .... Take 1 Tablet By Mouth Once A Day As Needed 4)  Colace 100 Mg  Caps (Docusate Sodium) .... Take Two Tablets By Mouth Four Times Daily As Needed 5)  Metformin Hcl 1000 Mg Tabs (Metformin Hcl) .... Take 1 Tablet By Mouth Two Times A Day 6)  Amlodipine Besylate 10 Mg Tabs (Amlodipine Besylate) .... Take 1 Tablet By Mouth Once A Day 7)  Clonidine Hcl  0.3 Mg Tabs (Clonidine Hcl) .... Take 1 Tab By Mouth At Bedtime 8)  Caltrate 600+d 600-400 Mg-Unit Tabs (Calcium Carbonate-Vitamin D) .... Take 1 Tablet By Mouth Two Times A Day 9)  Lotensin 40 Mg Tabs (Benazepril Hcl) .... One Tab By Mouth Qd 10)  Hydrochlorothiazide 25 Mg Tabs (Hydrochlorothiazide) .... One Tab By Mouth Qd 11)  Crestor 40 Mg Tabs (Rosuvastatin Calcium) .... One Tab By Mouth At Bedtime 12)  Ibuprofen 600 Mg Tabs (Ibuprofen) .... One Tab By Mouth Four Times Once Daily For Pain 13)  Methocarbamol 750 Mg Tabs (Methocarbamol) .... Two Tabs By Mouth Four Times Daily  Allergies (verified): 1)  ! * Nolamine 2)  ! Celebrex 3)  ! * Sklexain  Review of Systems      See HPI Eyes:  Complains of vision loss-both eyes; denies discharge, eye pain, and red eye. ENT:  Complains of nasal congestion, postnasal drainage, and sinus pressure. CV:  Complains of chest pain or discomfort and near fainting; denies difficulty breathing while lying down, leg cramps with exertion, palpitations, and shortness of breath with exertion. Resp:  Complains of cough; denies sputum productive. GI:  Denies abdominal pain, constipation, diarrhea, nausea, and vomiting. GU:  Denies dysuria and urinary frequency. MS:  Denies  joint pain and stiffness. Derm:  Denies itching and rash. Neuro:  Denies headaches, memory loss, numbness, and poor balance. Psych:  Denies anxiety and depression. Endo:  Denies cold intolerance, excessive hunger, excessive thirst, and excessive urination. Heme:  Denies abnormal bruising, bleeding, fevers, and pallor. Allergy:  Complains of seasonal allergies; denies hives or rash and itching eyes.  Physical Exam  General:  Well-developed,obesein no acute distress; alert,appropriate and cooperative throughout examination HEENT: No facial asymmetry,  EOMI, No sinus tenderness, TM's Clear, oropharynx  pink and moist.   Chest: Clear to auscultation bilaterally.  CVS: S1, S2, No murmurs,  No S3.   Abd: Soft, Nontender.  MS: Adequate ROM spine, hips, shoulders and knees.  Ext: No edema.   CNS: CN 2-12 intact, power tone and sensation normal throughout.   Skin: Intact, no visible lesions or rashes.  Psych: Good eye contact, normal affect.  Memory intact, not anxious or depressed appearing.   Diabetes Management Exam:    Foot Exam (with socks and/or shoes not present):       Sensory-Monofilament:          Left foot: normal          Right foot: normal       Inspection:          Left foot: normal          Right foot: normal       Nails:          Left foot: normal          Right foot: normal   Impression & Recommendations:  Problem # 1:  HYPERLIPIDEMIA (ICD-272.4) Assessment Comment Only  Her updated medication list for this problem includes:    Crestor 40 Mg Tabs (Rosuvastatin calcium) ..... One tab by mouth at bedtime  Orders: T-Hepatic Function [phone]) T-Lipid Profile [phone]) T-Hepatic Function [phone]) T-Lipid Profile [id number])  Labs Reviewed: SGOT: 18 (07/22/2010)   SGPT: 24 (07/22/2010)   HDL:56 (07/22/2010), 63 (12/26/2009)  LDL:87 (07/22/2010), 173 (28/41/3244)  Chol:157 (07/22/2010), 256 (12/26/2009)  Trig:69 (07/22/2010), 102 (12/26/2009)  Problem # 2:  DIABETES MELLITUS, TYPE II (ICD-250.00) Assessment: Comment Only  The following medications were removed from the medication list:    Metformin Hcl 1000 Mg Tabs (Metformin hcl) .Marland Kitchen... ,take 1 tablet by mouth two times a day Her updated medication list for this problem includes:    Aspirin Ec 81 Mg Tbec (Aspirin) .Marland Kitchen... Take 1 tablet by mouth once a day    Metformin Hcl 1000 Mg Tabs (Metformin hcl) .Marland Kitchen... Take 1 tablet by mouth two times a day    Lotensin 40 Mg Tabs (Benazepril hcl) ..... One tab by mouth qd Patient advised to reduce carbs and sweets, commit to regular physical activity, take meds as prescribed, test blood sugars as directed, and attempt to lose weight , to improve  blood sugar control.  Orders: T- Hemoglobin A1C [id number]) T- Hemoglobin A1C ([id number])  Labs Reviewed: Creat: 0.83 (07/22/2010)    Reviewed HgBA1c results: 6.8 (07/22/2010)  6.6 (12/26/2009)  Problem # 3:  HYPERTENSION (ICD-401.9) Assessment: Deteriorated  Her updated medication list for this problem includes:    Furosemide 20 Mg Tabs (Furosemide) .Marland Kitchen... Take 1 tablet by mouth once a day as needed    Amlodipine Besylate 10 Mg Tabs (Amlodipine besylate) .Marland Kitchen... Take 1 tablet by mouth once a day    Clonidine Hcl 0.3 Mg Tabs (Clonidine hcl) .Marland Kitchen... Take 1 tab by mouth at bedtime    Lotensin 40 Mg  Tabs (Benazepril hcl) ..... One tab by mouth qd    Hydrochlorothiazide 25 Mg Tabs (Hydrochlorothiazide) ..... One tab by mouth qd  Orders: T-Basic Metabolic Panel [id number]) T-Basic Metabolic Panel [id number])  BP today: 132/90 Prior BP: 120/82 (07/22/2010)  Labs Reviewed: K+: 3.8 (07/22/2010) Creat: : 0.83 (07/22/2010)   Chol: 157 (07/22/2010)   HDL: 56 (07/22/2010)   LDL: 87 (07/22/2010)   TG: 69 (07/22/2010)  Problem # 4:  OBESITY (ICD-278.00) Assessment: Unchanged  Ht: 63 (10/28/2010)   Wt: 251.75 (10/28/2010)   BMI: 44.76 (10/28/2010) therapeutic lifestyle change discussed and encouraged  Problem # 5:  CHEST PAIN UNSPECIFIED (ICD-786.50) Assessment: Comment Only has upcoming cardiology appt  Complete Medication List: 1)  Aspirin Ec 81 Mg Tbec (Aspirin) .... Take 1 tablet by mouth once a day 2)  Protonix 40 Mg Tbec (Pantoprazole sodium) .... Take 1 tablet by mouth once a day 3)  Furosemide 20 Mg Tabs (Furosemide) .... Take 1 tablet by mouth once a day as needed 4)  Colace 100 Mg Caps (Docusate sodium) .... Take two tablets by mouth four times daily as needed 5)  Metformin Hcl 1000 Mg Tabs (Metformin hcl) .... Take 1 tablet by mouth two times a day 6)  Amlodipine Besylate 10 Mg Tabs (Amlodipine besylate) .... Take 1 tablet by mouth once a day 7)  Clonidine Hcl 0.3 Mg  Tabs (Clonidine hcl) .... Take 1 tab by mouth at bedtime 8)  Caltrate 600+d 600-400 Mg-unit Tabs (Calcium carbonate-vitamin d) .... Take 1 tablet by mouth two times a day 9)  Lotensin 40 Mg Tabs (Benazepril hcl) .... One tab by mouth qd 10)  Hydrochlorothiazide 25 Mg Tabs (Hydrochlorothiazide) .... One tab by mouth qd 11)  Crestor 40 Mg Tabs (Rosuvastatin calcium) .... One tab by mouth at bedtime 12)  Ibuprofen 600 Mg Tabs (Ibuprofen) .... One tab by mouth four times once daily for pain 13)  Methocarbamol 750 Mg Tabs (Methocarbamol) .... Two tabs by mouth four times daily 14)  Septra Ds 800-160 Mg Tabs (Sulfamethoxazole-trimethoprim) .... Take 1 tablet by mouth two times a day 15)  Fluconazole 150 Mg Tabs (Fluconazole) .... Take 1 tablet by mouth once a day as needed for vaginal itch  Patient Instructions: 1)  Please schedule a CPE in 4 months. 2)  It is important that you exercise regularly at least 30 minutes 5 times a week. If you develop chest pain, have severe difficulty breathing, or feel very tired , stop exercising immediately and seek medical attention. 3)  You need to lose weight. Consider a lower calorie diet and regular exercise. Congratds on 10 pound weight loss 4)  BMP prior to visit, ICD-9: 5)  Hepatic Panel prior to visit, ICD-9: 6)  Lipid Panel prior to visit, ICD-9:  fasting 7)  HbgA1C prior to visit, ICD-9: 8)  Fasting labs  9)  BMP prior to visit, ICD-9: 10)  Hepatic Panel prior to visit, ICD-9: 11)  Lipid Panel prior to visit, ICD-9: 12)  HbgA1C prior to visit, ICD-9: 13)  Med is sent in for sinusitis. 14)  PLS do keep appt with cardiology Prescriptions: CRESTOR 40 MG TABS (ROSUVASTATIN CALCIUM) one tab by mouth at bedtime  #90 x 1   Entered by:   Adella Hare LPN   Authorized by:   Syliva Overman MD   Signed by:   Adella Hare LPN on 29/56/2130   Method used:   Electronically to        [facility]* (retail)  [address]        [address] Shipping/mailing       [place], [place]  [address]       Ph: [phone]       Fax: [phone]   RxID:   [id number] HYDROCHLOROTHIAZIDE 25 MG TABS (HYDROCHLOROTHIAZIDE) one tab by mouth qd  #90 Tablet x 1   Entered by:   Adella Hare LPN   Authorized by:   Syliva Overman MD   Signed by:   Adella Hare LPN on 02/72/5366   Method used:   Electronically to        [facility]* (retail)       [address]       [address] Shipping/mailing       [place], [place]  [address]       Ph: [phone]       Fax: [phone]   RxID:   [id number] LOTENSIN 40 MG TABS (BENAZEPRIL HCL) one tab by mouth qd  #90 Tablet x 1   Entered by:   Adella Hare LPN   Authorized by:   Syliva Overman MD   Signed by:   Adella Hare LPN on 23/55/7322   Method used:   Electronically to        [facility]* (retail)       [address]       [address] Shipping/mailing       [place], [place]  [address]       Ph: [phone]       Fax: [phone]   RxID:   [id number] CLONIDINE HCL 0.3 MG TABS (CLONIDINE HCL) Take 1 tab by mouth at bedtime  #90 Tablet x 1   Entered by:   Adella Hare LPN   Authorized by:   Syliva Overman MD   Signed by:   Adella Hare LPN on 50/08/3817   Method used:   Electronically to        [facility]* (retail)       [address]       [address] Shipping/mailing       [place], [place]  [address]       Ph: [phone]       Fax: [phone]   RxID:   [id number] AMLODIPINE BESYLATE 10 MG TABS (AMLODIPINE BESYLATE) Take 1 tablet by mouth once a day  #90 x 1   Entered by:   Adella Hare LPN   Authorized by:   Syliva Overman MD   Signed by:   Adella Hare LPN on 54/00/8676   Method used:   Electronically to        [facility]* (retail)       [address]       [address] Shipping/mailing       [place], [place]  [address]       Ph: [phone]       Fax:  [phone]   RxID:   [id number] METFORMIN HCL 1000 MG TABS (METFORMIN HCL) Take 1 tablet by mouth two times a day  #180 x 1   Entered by:   Adella Hare LPN   Authorized by:   Syliva Overman MD   Signed by:   Adella Hare LPN on 97/35/3299   Method used:   Electronically to        [facility]* (retail)  [address]       [address] Shipping/mailing       [place], [place]  [address]       Ph: [phone]       Fax: [phone]   RxID:   [id number] FUROSEMIDE 20 MG  TABS (FUROSEMIDE) Take 1 tablet by mouth once a day as needed  #90 x 1   Entered by:   Adella Hare LPN   Authorized by:   Syliva Overman MD   Signed by:   Adella Hare LPN on 84/13/2440   Method used:   Electronically to        [facility]* (retail)       [address]       [address] Shipping/mailing       [place], [place]  [address]       Ph: [phone]       Fax: [phone]   RxID:   [id number] PROTONIX 40 MG  TBEC (PANTOPRAZOLE SODIUM) Take 1 tablet by mouth once a day  #90 x 1   Entered by:   Adella Hare LPN   Authorized by:   Syliva Overman MD   Signed by:   Adella Hare LPN on 30/16/0109   Method used:   Electronically to        [facility]* (retail)       [address]       [address] Shipping/mailing       [place], [place]  [address]       Ph: [phone]       Fax: [phone]   RxID:   [id number] FLUCONAZOLE 150 MG TABS (FLUCONAZOLE) Take 1 tablet by mouth once a day as needed for vaginal itch  #3 x 0   Entered and Authorized by:   Syliva Overman MD   Signed by:   Syliva Overman MD on 10/28/2010   Method used:   Electronically to        [facility]* (retail)       [address]       [address] Shipping/mailing       [place], [place]  [address]       Ph: [phone]       Fax: [phone]   RxID:   [id number] SEPTRA DS 800-160 MG TABS  (SULFAMETHOXAZOLE-TRIMETHOPRIM) Take 1 tablet by mouth two times a day  #20 x 0   Entered and Authorized by:   Syliva Overman MD   Signed by:   Syliva Overman MD on 10/28/2010   Method used:   Electronically to        [facility]* (retail)       [address]       [address] Shipping/mailing       [place], [place]  [address]       Ph: [phone]       Fax: [phone]   RxID:   [id number]    Orders Added: 1)  Est. Patient Level IV [[address]] 2)  T-Basic Metabolic Panel [[address]053-97673] 3)  T-Hepatic Function [80076-22960] 4)  T-Lipid Profile [80061-22930] 5)  T- Hemoglobin A1C [83036-23375] 6)  T-Basic Metabolic Panel [80048-22910] 7)  T-Hepatic Function [80076-22960] 8)  T-Lipid Profile [80061-22930] 9)  T- Hemoglobin A1C [83036-23375]

## 2011-01-02 NOTE — Miscellaneous (Signed)
  Clinical Lists Changes  Medications: Removed medication of METFORMIN HCL 1000 MG TABS (METFORMIN HCL) Take 1 tablet by mouth two times a day Added new medication of JANUMET 50-1000 MG TABS (SITAGLIPTIN-METFORMIN HCL) Take 1 tablet by mouth two times a day - Signed Rx of JANUMET 50-1000 MG TABS (SITAGLIPTIN-METFORMIN HCL) Take 1 tablet by mouth two times a day;  #180 x 1;  Signed;  Entered by: Syliva Overman MD;  Authorized by: Syliva Overman MD;  Method used: Electronically to [facility]*, [address] Shipping/mailing, [place], [place]  [address], Ph: [phone], Fax: [phone]    Prescriptions: JANUMET 50-1000 MG TABS (SITAGLIPTIN-METFORMIN HCL) Take 1 tablet by mouth two times a day  #180 x 1   Entered and Authorized by:   Syliva Overman MD   Signed by:   Syliva Overman MD on 10/30/2010   Method used:   Electronically to        [facility]* (retail)       [address]       [address] Shipping/mailing       [place], [place]  [address]       Ph: [phone]       Fax: [phone]   RxID:   [id number]

## 2011-01-02 NOTE — Progress Notes (Signed)
Summary: medicine  Phone Note Call from Patient   Summary of Call: look for refills from [facility] for her Initial call taken by: Lind Guest,  March 29, 2010 2:48 PM  Follow-up for Phone Call        Rx Called In Follow-up by: Adella Hare LPN,  March 29, 2010 4:05 PM

## 2011-01-02 NOTE — Assessment & Plan Note (Signed)
Summary: office visit   Vital Signs:  Patient profile:   55 year old female Menstrual status:  hysterectomy Height:      63 inches Weight:      250.75 pounds BMI:     44.58 O2 Sat:      97 % Pulse rate:   72 / minute Pulse rhythm:   regular Resp:     16 per minute BP sitting:   120 / 80  Vitals Entered By: Everitt Amber (December 26, 2009 11:34 AM) CC: sore throat, headache, congestion and sneezing, some diarrhea and also phlegm is white and solid   CC:  sore throat, headache, congestion and sneezing, and some diarrhea and also phlegm is white and solid.  History of Present Illness: Reports  that tshe has been doing fairly well. Denies recent fever or chills. t. Denies chest congestion, or cough productive of sputum. Denies chest pain, palpitations, PND, orthopnea or leg swelling. Denies abdominal pain, nausea, vomitting, diarrhea or constipation. Denies change in bowel movements or bloody stool. Denies dysuria , frequency, incontinence or hesitancy. Denies  joint pain, swelling, or reduced mobility.  Denies depression, anxiety or insomnia. Denies  rash, lesions, or itch.     Current Medications (verified): 1)  Aspirin Ec 81 Mg  Tbec (Aspirin) .... Take 1 Tablet By Mouth Once A Day 2)  Protonix 40 Mg  Tbec (Pantoprazole Sodium) .... Take 1 Tablet By Mouth Once A Day 3)  Furosemide 20 Mg  Tabs (Furosemide) .... Take 1 Tablet By Mouth Once A Day As Needed 4)  Vytorin 10-80 Mg  Tabs (Ezetimibe-Simvastatin) .... Take 1 Tab By Mouth At Bedtime 5)  Colace 100 Mg  Caps (Docusate Sodium) .... Take Two Tablets By Mouth Four Times Daily As Needed 6)  Metformin Hcl 1000 Mg Tabs (Metformin Hcl) .... Take 1 Tablet By Mouth Two Times A Day 7)  Amlodipine Besylate 10 Mg Tabs (Amlodipine Besylate) .... Take 1 Tablet By Mouth Once A Day 8)  Clonidine Hcl 0.3 Mg Tabs (Clonidine Hcl) .... Take 1 Tab By Mouth At Bedtime 9)  Caltrate 600+d 600-400 Mg-Unit Tabs (Calcium Carbonate-Vitamin D)  .... Take 1 Tablet By Mouth Two Times A Day 10)  Phentermine Hcl 37.5 Mg Caps (Phentermine Hcl) .... Take 1 Tablet By Mouth Once A Day 11)  Lotensin 40 Mg Tabs (Benazepril Hcl) .... One Tab By Mouth Qd 12)  Hydrochlorothiazide 25 Mg Tabs (Hydrochlorothiazide) .... One Tab By Mouth Qd  Allergies (verified): 1)  ! * Nolamine 2)  ! Celebrex 3)  ! * Sklexain  Review of Systems      See HPI Eyes:  Denies blurring and discharge. ENT:  Complains of nasal congestion, postnasal drainage, and sore throat; denies sinus pressure. Neuro:  Complains of headaches; denies seizures and sensation of room spinning. Endo:  Denies cold intolerance, excessive hunger, excessive thirst, excessive urination, heat intolerance, polyuria, and weight change; tests daly and fastings are seldom over 120. Heme:  Denies abnormal bruising and bleeding. Allergy:  Denies hives or rash.  Physical Exam  General:  Well-developed,morbidly obese,in no acute distress; alert,appropriate and cooperative throughout examination HEENT: No facial asymmetry,  EOMI, No sinus tenderness, TM's Clear, oropharynx  pink and moist.   Chest: Clear to auscultation bilaterally.  CVS: S1, S2, No murmurs, No S3.   Abd: Soft, Nontender.  MS: Adequate ROM spine, hips, shoulders and knees.  Ext: No edema.   CNS: CN 2-12 intact, power tone and sensation normal throughout.  Skin: Intact, no visible lesions or rashes.  Psych: Good eye contact, normal affect.  Memory intact, not anxious or depressed appearing.    Impression & Recommendations:  Problem # 1:  DIABETES MELLITUS, TYPE II (ICD-250.00) Assessment Improved  Her updated medication list for this problem includes:    Aspirin Ec 81 Mg Tbec (Aspirin) .Marland Kitchen... Take 1 tablet by mouth once a day    Metformin Hcl 1000 Mg Tabs (Metformin hcl) .Marland Kitchen... Take 1 tablet by mouth two times a day    Lotensin 40 Mg Tabs (Benazepril hcl) ..... One tab by mouth qd    Metformin Hcl 1000 Mg Tabs (Metformin  hcl) .Marland Kitchen... ,take 1 tablet by mouth two times a day  Labs Reviewed: Creat: 0.94 (07/04/2009)    Reviewed HgBA1c results: 6.6 (12/26/2009)  6.9 (10/03/2009)  Problem # 2:  HYPERLIPIDEMIA (ICD-272.4) Assessment: Comment Only  The following medications were removed from the medication list:    Vytorin 10-80 Mg Tabs (Ezetimibe-simvastatin) .Marland Kitchen... Take 1 tab by mouth at bedtime Her updated medication list for this problem includes:    Crestor 40 Mg Tabs (Rosuvastatin calcium) .Marland Kitchen... Take 1 tab by mouth at bedtime  Orders: T-Hepatic Function [id number]) T-Lipid Profile [id number])  Labs Reviewed: SGOT: 17 (07/04/2009)   SGPT: 21 (07/04/2009)   HDL:62 (07/04/2009), 64 (05/02/2009)  LDL:92 (07/04/2009), 116 (28/41/3244)  Chol:164 (07/04/2009), 195 (05/02/2009)  Trig:52 (07/04/2009), 75 (05/02/2009)  Problem # 3:  HYPERTENSION (ICD-401.9) Assessment: Unchanged  Her updated medication list for this problem includes:    Furosemide 20 Mg Tabs (Furosemide) .Marland Kitchen... Take 1 tablet by mouth once a day as needed    Amlodipine Besylate 10 Mg Tabs (Amlodipine besylate) .Marland Kitchen... Take 1 tablet by mouth once a day    Clonidine Hcl 0.3 Mg Tabs (Clonidine hcl) .Marland Kitchen... Take 1 tab by mouth at bedtime    Lotensin 40 Mg Tabs (Benazepril hcl) ..... One tab by mouth qd    Hydrochlorothiazide 25 Mg Tabs (Hydrochlorothiazide) ..... One tab by mouth qd  Orders: T-Basic Metabolic Panel [id number])  BP today: 120/80 Prior BP: 120/80 (10/03/2009)  Labs Reviewed: K+: 4.3 (07/04/2009) Creat: : 0.94 (07/04/2009)   Chol: 164 (07/04/2009)   HDL: 62 (07/04/2009)   LDL: 92 (07/04/2009)   TG: 52 (07/04/2009)  Problem # 4:  OBESITY (ICD-278.00) Assessment: Unchanged  Ht: 63 (12/26/2009)   Wt: 250.75 (12/26/2009)   BMI: 44.58 (12/26/2009)  Complete Medication List: 1)  Aspirin Ec 81 Mg Tbec (Aspirin) .... Take 1 tablet by mouth once a day 2)  Protonix 40 Mg Tbec (Pantoprazole sodium) .... Take 1 tablet by mouth once  a day 3)  Furosemide 20 Mg Tabs (Furosemide) .... Take 1 tablet by mouth once a day as needed 4)  Colace 100 Mg Caps (Docusate sodium) .... Take two tablets by mouth four times daily as needed 5)  Metformin Hcl 1000 Mg Tabs (Metformin hcl) .... Take 1 tablet by mouth two times a day 6)  Amlodipine Besylate 10 Mg Tabs (Amlodipine besylate) .... Take 1 tablet by mouth once a day 7)  Clonidine Hcl 0.3 Mg Tabs (Clonidine hcl) .... Take 1 tab by mouth at bedtime 8)  Caltrate 600+d 600-400 Mg-unit Tabs (Calcium carbonate-vitamin d) .... Take 1 tablet by mouth two times a day 9)  Phentermine Hcl 37.5 Mg Caps (Phentermine hcl) .... Take 1 tablet by mouth once a day 10)  Lotensin 40 Mg Tabs (Benazepril hcl) .... One tab by mouth qd 11)  Hydrochlorothiazide 25 Mg Tabs (Hydrochlorothiazide) .Marland KitchenMarland KitchenMarland Kitchen  One tab by mouth qd 12)  Metformin Hcl 1000 Mg Tabs (Metformin hcl) .... ,take 1 tablet by mouth two times a day 13)  Crestor 40 Mg Tabs (Rosuvastatin calcium) .... Take 1 tab by mouth at bedtime  Other Orders: Glucose, (CBG) (82962) Hemoglobin A1C (83036) T-Urine Microalbumin w/creat. ratio [phone])  Patient Instructions: 1)  Please schedule a follow-up appointment in2 months. 2)  Please schedule a follow-up appointment in 3 months. 3)  It is important that you exercise regularly at least 20 minutes 5 times a week. If you develop chest pain, have severe difficulty breathing, or feel very tired , stop exercising immediately and seek medical attention. 4)  You need to lose weight. Consider a lower calorie diet and regular exercise.  Prescriptions: CRESTOR 40 MG TABS (ROSUVASTATIN CALCIUM) Take 1 tab by mouth at bedtime  #90 x 1   Entered and Authorized by:   Syliva Overman MD   Signed by:   Syliva Overman MD on 12/27/2009   Method used:   Electronically to        [facility]* (retail)       [address]       [address] Shipping/mailing       [place], [place]   [address]       Ph: [phone]       Fax: [phone]   RxID:   [id number] METFORMIN HCL 1000 MG TABS (METFORMIN HCL) ,Take 1 tablet by mouth two times a day  #180 x 3   Entered and Authorized by:   Syliva Overman MD   Signed by:   Syliva Overman MD on 12/26/2009   Method used:   Electronically to        [facility]* (retail)       [address]       [address] Shipping/mailing       [place], [place]  [address]       Ph: [phone]       Fax: [phone]   RxID:   [id number] METFORMIN HCL 1000 MG TABS (METFORMIN HCL) Take 1 tablet by mouth two times a day  #180 x 2   Entered by:   Everitt Amber   Authorized by:   Syliva Overman MD   Signed by:   Everitt Amber on 12/26/2009   Method used:   Electronically to        [facility]* (retail)       [address]       [address] Shipping/mailing       [place], [place]  [address]       Ph: [phone]       Fax: [phone]   RxID:   [id number]   Laboratory Results   Blood Tests   Date/Time Received: December 26, 2009  Date/Time Reported: December 26, 2009   Glucose (random): 115 mg/dL   (Normal Range: 96-789) HGBA1C: 6.6%   (Normal Range: Non-Diabetic - 3-6%   Control Diabetic - 6-8%)      Laboratory Results   Blood Tests     Glucose (random): 115 mg/dL   (Normal Range: 38-101) HGBA1C: 6.6%   (Normal Range: Non-Diabetic - 3-6%   Control Diabetic - 6-8%)

## 2011-01-21 ENCOUNTER — Encounter: Payer: Self-pay | Admitting: Family Medicine

## 2011-01-21 ENCOUNTER — Ambulatory Visit (INDEPENDENT_AMBULATORY_CARE_PROVIDER_SITE_OTHER): Payer: Commercial Managed Care - PPO | Admitting: Family Medicine

## 2011-01-21 ENCOUNTER — Other Ambulatory Visit: Payer: Self-pay | Admitting: Family Medicine

## 2011-01-21 DIAGNOSIS — I10 Essential (primary) hypertension: Secondary | ICD-10-CM

## 2011-01-21 DIAGNOSIS — E669 Obesity, unspecified: Secondary | ICD-10-CM

## 2011-01-21 DIAGNOSIS — E785 Hyperlipidemia, unspecified: Secondary | ICD-10-CM

## 2011-01-21 DIAGNOSIS — M549 Dorsalgia, unspecified: Secondary | ICD-10-CM

## 2011-01-21 LAB — CONVERTED CEMR LAB
Creatinine, Urine: 117.7 mg/dL
Microalb Creat Ratio: 7.1 mg/g (ref 0.0–30.0)
Microalb, Ur: 0.84 mg/dL (ref 0.00–1.89)

## 2011-01-28 LAB — CONVERTED CEMR LAB
Calcium: 9.9 mg/dL (ref 8.4–10.5)
Creatinine, Ser: 0.85 mg/dL (ref 0.40–1.20)
Sodium: 140 meq/L (ref 135–145)

## 2011-01-28 NOTE — Assessment & Plan Note (Signed)
Summary: f up   Vital Signs:  Patient profile:   55 year old female Menstrual status:  hysterectomy Height:      63 inches Weight:      254.75 pounds BMI:     45.29 O2 Sat:      94 % Pulse rate:   93 / minute Pulse rhythm:   regular Resp:     16 per minute BP sitting:   130 / 84  (right arm)  Vitals Entered By: Everitt Amber LPN (January 21, 2011 8:28 AM)  Nutrition Counseling: Patient's BMI is greater than 25 and therefore counseled on weight management options. CC: Follow up chronic problems   Primary Care Provider:  Dr. Syliva Overman  CC:  Follow up chronic problems.  History of Present Illness: Reports  that she is generally doing well, she is frustrated by her lack of weight loss, but intends to continue and improve on her healthy lifestyle changes. Denies recent fever or chills. Denies sinus pressure, nasal congestion , ear pain or sore throat. Denies chest congestion, or cough productive of sputum. Denies chest pain, palpitations, PND, orthopnea or leg swelling. Denies abdominal pain, nausea, vomitting, diarrhea or constipation. Denies change in bowel movements or bloody stool. Denies dysuria , frequency, incontinence or hesitancy. Denies  joint pain, swelling, or reduced mobility. Denies headaches, vertigo, seizures. Denies depression, anxiety or insomnia. Denies  rash, lesions, or itch.     Current Medications (verified): 1)  Aspirin Ec 81 Mg  Tbec (Aspirin) .... Take 1 Tablet By Mouth Once A Day 2)  Protonix 40 Mg  Tbec (Pantoprazole Sodium) .... Take 1 Tablet By Mouth Once A Day 3)  Furosemide 20 Mg  Tabs (Furosemide) .... Take 1 Tablet By Mouth Once A Day As Needed 4)  Colace 100 Mg  Caps (Docusate Sodium) .... Take Two Tablets By Mouth Four Times Daily As Needed 5)  Amlodipine Besylate 10 Mg Tabs (Amlodipine Besylate) .... Take 1 Tablet By Mouth Once A Day 6)  Clonidine Hcl 0.3 Mg Tabs (Clonidine Hcl) .... Take 1 Tab By Mouth At Bedtime 7)  Caltrate  600+d 600-400 Mg-Unit Tabs (Calcium Carbonate-Vitamin D) .... Take 1 Tablet By Mouth Two Times A Day 8)  Lotensin 40 Mg Tabs (Benazepril Hcl) .... One Tab By Mouth Qd 9)  Hydrochlorothiazide 25 Mg Tabs (Hydrochlorothiazide) .... One Tab By Mouth Qd 10)  Crestor 40 Mg Tabs (Rosuvastatin Calcium) .... One Tab By Mouth At Bedtime 11)  Metformin Hcl 1000 Mg Tabs (Metformin Hcl) .... Take 1 Tablet By Mouth Two Times A Day  Allergies: 1)  ! * Nolamine 2)  ! Celebrex 3)  ! * Sklexain 4)  ! * Janumet  Past History:  Past Medical History: Normal coronary arteries at catheterization 4/09 [facility]) Fractured ankle Fractured left foot Hyperlipidemia Impaired glucose tolerance Allergic rhinitis Obesity G E R D dry eyes and "high intraoccular pressure"  in 2011, not glaucoma  Review of Systems      See HPI Eyes:  Complains of vision loss-both eyes; wears corrective lenses. Endo:  Denies excessive thirst and excessive urination; fasting sugars s4eldom ovwer 120. Heme:  Denies abnormal bruising and bleeding. Allergy:  Complains of seasonal allergies; denies hives or rash and itching eyes.  Physical Exam  General:  HEENT: No facial asymmetry,  EOMI, No sinus tenderness, TM's Clear, oropharynx  pink and moist.   Chest: Clear to auscultation bilaterally.  CVS: S1, S2, No murmurs, No S3.   Abd: Soft, Nontender.  MS: Adequate ROM spine, hips, shoulders and knees.  Ext: No edema.   CNS: CN 2-12 intact, power tone and sensation normal throughout.   Skin: Intact, no visible lesions or rashes.  Psych: Good eye contact, normal affect.  Memory intact, not anxious or depressed appearing.   Diabetes Management Exam:    Foot Exam (with socks and/or shoes not present):       Sensory-Monofilament:          Left foot: normal          Right foot: normal       Inspection:          Left foot: normal          Right foot: normal       Nails:          Left foot: thickened          Right foot:  thickened    Eye Exam:       Eye Exam done elsewhere          Date: 01/15/2011          Results: normal          Done by: brewington   Impression & Recommendations:  Problem # 1:  DIABETES MELLITUS, TYPE II (ICD-250.00) Assessment Comment Only  The following medications were removed from the medication list:    Janumet 50-1000 Mg Tabs (Sitagliptin-metformin hcl) .Marland Kitchen... Take 1 tablet by mouth two times a day Her updated medication list for this problem includes:    Aspirin Ec 81 Mg Tbec (Aspirin) .Marland Kitchen... Take 1 tablet by mouth once a day    Lotensin 40 Mg Tabs (Benazepril hcl) ..... One tab by mouth qd    Metformin Hcl 1000 Mg Tabs (Metformin hcl) .Marland Kitchen... Take 1 tablet by mouth two times a day Patient advised to reduce carbs and sweets, commit to regular physical activity, take meds as prescribed, test blood sugars as directed, and attempt to lose weight , to improve blood sugar control.  Orders: T- Hemoglobin A1C ([id number]) T-Urine Microalbumin w/creat. ratio [id number])  Labs Reviewed: Creat: 0.82 (10/28/2010)     Last Eye Exam: normal (01/15/2011) Reviewed HgBA1c results: 7.0 (10/28/2010)  6.8 (07/22/2010)  Problem # 2:  HYPERLIPIDEMIA (ICD-272.4) Assessment: Comment Only  Her updated medication list for this problem includes:    Crestor 40 Mg Tabs (Rosuvastatin calcium) ..... One tab by mouth at bedtime Low fat dietdiscussed and encouraged   Orders: T-Lipid Profile [id number])  Labs Reviewed: SGOT: 22 (10/28/2010)   SGPT: 30 (10/28/2010)   HDL:58 (10/28/2010), 56 (07/22/2010)  LDL:77 (10/28/2010), 87 (07/22/2010)  Chol:150 (10/28/2010), 157 (07/22/2010)  Trig:74 (10/28/2010), 69 (07/22/2010)  Problem # 3:  HYPERTENSION (ICD-401.9) Assessment: Improved  Her updated medication list for this problem includes:    Furosemide 20 Mg Tabs (Furosemide) .Marland Kitchen... Take 1 tablet by mouth once a day as needed    Amlodipine Besylate 10 Mg Tabs (Amlodipine besylate) .Marland Kitchen...  Take 1 tablet by mouth once a day    Clonidine Hcl 0.3 Mg Tabs (Clonidine hcl) .Marland Kitchen... Take 1 tab by mouth at bedtime    Lotensin 40 Mg Tabs (Benazepril hcl) ..... One tab by mouth qd    Hydrochlorothiazide 25 Mg Tabs (Hydrochlorothiazide) ..... One tab by mouth qd  Orders: T-Basic Metabolic Panel [id number]) T-CMP with estimated GFR ([id number])  BP today: 130/84 Prior BP: 150/90 (10/30/2010)  Labs Reviewed: K+: 4.2 (10/28/2010) Creat: : 0.82 (10/28/2010)   Chol: 150 (10/28/2010)   HDL:  58 (10/28/2010)   LDL: 77 (10/28/2010)   TG: 74 (10/28/2010)  Problem # 4:  OBESITY (ICD-278.00) Assessment: Unchanged  Ht: 63 (01/21/2011)   Wt: 254.75 (01/21/2011)   BMI: 45.29 (01/21/2011) therapeutic lifestyle change discussed and encouraged  Complete Medication List: 1)  Aspirin Ec 81 Mg Tbec (Aspirin) .... Take 1 tablet by mouth once a day 2)  Protonix 40 Mg Tbec (Pantoprazole sodium) .... Take 1 tablet by mouth once a day 3)  Furosemide 20 Mg Tabs (Furosemide) .... Take 1 tablet by mouth once a day as needed 4)  Colace 100 Mg Caps (Docusate sodium) .... Take two tablets by mouth four times daily as needed 5)  Amlodipine Besylate 10 Mg Tabs (Amlodipine besylate) .... Take 1 tablet by mouth once a day 6)  Clonidine Hcl 0.3 Mg Tabs (Clonidine hcl) .... Take 1 tab by mouth at bedtime 7)  Caltrate 600+d 600-400 Mg-unit Tabs (Calcium carbonate-vitamin d) .... Take 1 tablet by mouth two times a day 8)  Lotensin 40 Mg Tabs (Benazepril hcl) .... One tab by mouth qd 9)  Hydrochlorothiazide 25 Mg Tabs (Hydrochlorothiazide) .... One tab by mouth qd 10)  Crestor 40 Mg Tabs (Rosuvastatin calcium) .... One tab by mouth at bedtime 11)  Metformin Hcl 1000 Mg Tabs (Metformin hcl) .... Take 1 tablet by mouth two times a day 12)  Vitamin D (ergocalciferol) 50000 Unit Caps (Ergocalciferol) .... One capsule once weekly  Other Orders: T-Vitamin D (25-Hydroxy) [id number]) Radiology Referral  (Radiology)  Patient Instructions: 1)  Please schedule a CPE in 4 months. 2)  It is important that you exercise regularly at least 20 minutes 5 times a week. If you develop chest pain, have severe difficulty breathing, or feel very tired , stop exercising immediately and seek medical attention. 3)  You need to lose weight. Consider a lower calorie diet and regular exercise.  4)  BMP prior to visit, ICD-9:   non fasting this week 5)  send microalb today 6)  BMP prior to visit, ICD-9 and egFR: 7)  Hepatic Panel prior to visit, ICD-9: 8)  Lipid Panel prior to visit, ICD-9:  fasting  in 4 months 9)  HbgA1C prior to visit, ICD-9: 10)  vit D 11)  new med for vit D deficiency pls take for the next 6 months. 12)  inc caltrate to one twice daily 13)  dexa top be scheduled Prescriptions: CRESTOR 40 MG TABS (ROSUVASTATIN CALCIUM) one tab by mouth at bedtime  #90 x 1   Entered by:   Adella Hare LPN   Authorized by:   Syliva Overman MD   Signed by:   Adella Hare LPN on 09/81/1914   Method used:   Electronically to        [facility]* (retail)       [address]       [address] Shipping/mailing       [place], [place]  [address]       Ph: [phone]       Fax: [phone]   RxID:   [id number] HYDROCHLOROTHIAZIDE 25 MG TABS (HYDROCHLOROTHIAZIDE) one tab by mouth qd  #90 Tablet x 1   Entered by:   Adella Hare LPN   Authorized by:   Syliva Overman MD   Signed by:   Adella Hare LPN on 40/34/7425   Method used:   Electronically to        [facility]* (retail)  [address]       [address] Shipping/mailing       [place], [place]  [address]       Ph: [phone]       Fax: [phone]   RxID:   [id number] LOTENSIN 40 MG TABS (BENAZEPRIL HCL) one tab by mouth qd  #90 Tablet x 1   Entered by:   Adella Hare LPN   Authorized by:   Syliva Overman MD   Signed by:   Adella Hare LPN on 84/13/2440   Method used:    Electronically to        [facility]* (retail)       [address]       [address] Shipping/mailing       [place], [place]  [address]       Ph: [phone]       Fax: [phone]   RxID:   [id number] CLONIDINE HCL 0.3 MG TABS (CLONIDINE HCL) Take 1 tab by mouth at bedtime  #90 Tablet x 1   Entered by:   Adella Hare LPN   Authorized by:   Syliva Overman MD   Signed by:   Adella Hare LPN on 30/16/0109   Method used:   Electronically to        [facility]* (retail)       [address]       [address] Shipping/mailing       [place], [place]  [address]       Ph: [phone]       Fax: [phone]   RxID:   [id number] AMLODIPINE BESYLATE 10 MG TABS (AMLODIPINE BESYLATE) Take 1 tablet by mouth once a day  #90 x 1   Entered by:   Adella Hare LPN   Authorized by:   Syliva Overman MD   Signed by:   Adella Hare LPN on 48/54/6270   Method used:   Electronically to        [facility]* (retail)       [address]       [address] Shipping/mailing       [place], [place]  [address]       Ph: [phone]       Fax: [phone]   RxID:   [id number] PROTONIX 40 MG  TBEC (PANTOPRAZOLE SODIUM) Take 1 tablet by mouth once a day  #90 x 1   Entered by:   Adella Hare LPN   Authorized by:   Syliva Overman MD   Signed by:   Adella Hare LPN on 35/36/1443   Method used:   Electronically to        [facility]* (retail)       [address]       [address] Shipping/mailing       [place], [place]  [address]       Ph: [phone]       Fax: [phone]   RxID:   [id number] VITAMIN D (ERGOCALCIFEROL) 50000 UNIT CAPS (ERGOCALCIFEROL) one capsule once weekly  #12 x 1   Entered and Authorized by:   Syliva Overman MD   Signed by:   Syliva Overman MD on 01/21/2011   Method used:   Electronically to        [facility]* (retail)       [address]  [address] Shipping/mailing       [place], [place]  [address]       Ph: [phone]       Fax: [phone]   RxID:   [id number]    Orders Added: 1)  Est. Patient Level IV [84[address]] 2)  T-Basic Metabolic Panel [id number] 3)  T-CMP with estimated GFR [[address]] 4)  T-Lipid Profile [80061-22930] 5)  T- Hemoglobin A1C [83036-23375] 6)  T-Vitamin D (25-Hydroxy) [66440-34742] 7)  T-Urine Microalbumin w/creat. ratio [820[address]] 8)  Radiology Referral [Radiology]

## 2011-01-29 ENCOUNTER — Ambulatory Visit (HOSPITAL_COMMUNITY)
Admission: RE | Admit: 2011-01-29 | Discharge: 2011-01-29 | Disposition: A | Discharge status: Home / Self Care | Payer: 59 | Source: Ambulatory Visit | Attending: Family Medicine | Admitting: Family Medicine

## 2011-01-29 DIAGNOSIS — M949 Disorder of cartilage, unspecified: Secondary | ICD-10-CM | POA: Insufficient documentation

## 2011-01-29 DIAGNOSIS — M899 Disorder of bone, unspecified: Secondary | ICD-10-CM | POA: Insufficient documentation

## 2011-02-03 LAB — CONVERTED CEMR LAB: Hgb A1c MFr Bld: 7 % — ABNORMAL HIGH (ref <5.7)

## 2011-02-06 ENCOUNTER — Telehealth: Payer: Self-pay | Admitting: Family Medicine

## 2011-02-11 LAB — POCT CARDIAC MARKERS
CKMB, poc: 3.9 ng/mL (ref 1.0–8.0)
Troponin i, poc: <0.05 ng/mL (ref 0.00–0.09)

## 2011-02-11 LAB — BASIC METABOLIC PANEL
BUN: 6 mg/dL (ref 6–23)
Creatinine, Ser: 0.8 mg/dL (ref 0.4–1.2)
GFR calc non Af Amer: >60 mL/min (ref >60)
Glucose, Bld: 97 mg/dL (ref 70–99)
Potassium: 3.6 mEq/L (ref 3.5–5.1)

## 2011-02-11 LAB — CBC
HCT: 36.4 % (ref 36.0–46.0)
MCHC: 32.7 g/dL (ref 30.0–36.0)
Platelets: 365 10*3/uL (ref 150–400)
RDW: 15 % (ref 11.5–15.5)
WBC: 8 10*3/uL (ref 4.0–10.5)

## 2011-02-11 LAB — DIFFERENTIAL
Basophils Absolute: 0 10*3/uL (ref 0.0–0.1)
Basophils Relative: 0 % (ref 0–1)
Lymphocytes Relative: 33 % (ref 12–46)
Neutro Abs: 4.3 10*3/uL (ref 1.7–7.7)
Neutrophils Relative %: 54 % (ref 43–77)

## 2011-02-11 NOTE — Progress Notes (Signed)
Summary: refill  Phone Note Call from Patient   Summary of Call: needs for nurse to send her medicine to [phone]. faxed last wk but didn't get them. please refax. [phone] Initial call taken by: Rudene Anda,  February 06, 2011 3:27 PM    Prescriptions: CRESTOR 40 MG TABS (ROSUVASTATIN CALCIUM) one tab by mouth at bedtime  #90 x 1   Entered by:   Adella Hare LPN   Authorized by:   Syliva Overman MD   Signed by:   Adella Hare LPN on 75/64/3329   Method used:   Printed then faxed to ...       [facility]* (retail)       [address]       [address] Shipping/mailing       [place], [place]  [address]       Ph: [phone]       Fax: [phone]   RxID:   [id number] LOTENSIN 40 MG TABS (BENAZEPRIL HCL) one tab by mouth qd  #90 Tablet x 1   Entered by:   Adella Hare LPN   Authorized by:   Syliva Overman MD   Signed by:   Adella Hare LPN on 31/51/7616   Method used:   Printed then faxed to ...       [facility]* (retail)       [address]       [address] Shipping/mailing       [place], [place]  [address]       Ph: [phone]       Fax: [phone]   RxID:   [id number]

## 2011-02-14 ENCOUNTER — Telehealth: Payer: Self-pay | Admitting: Family Medicine

## 2011-02-18 NOTE — Progress Notes (Signed)
Summary: refills  Phone Note Call from Patient   Summary of Call: pt needs medicine sent to [facility] [phone] Initial call taken by: Rudene Anda,  February 14, 2011 10:41 AM    Prescriptions: VITAMIN D (ERGOCALCIFEROL) 50000 UNIT CAPS (ERGOCALCIFEROL) one capsule once weekly  #12 x 0   Entered by:   Adella Hare LPN   Authorized by:   Syliva Overman MD   Signed by:   Adella Hare LPN on 19/14/7829   Method used:   Printed then faxed to ...       [facility]* (retail)       [address]       [address] Shipping/mailing       [place], [place]  [address]       Ph: [phone]       Fax: [phone]   RxID:   [id number] METFORMIN HCL 1000 MG TABS (METFORMIN HCL) Take 1 tablet by mouth two times a day  #180 x 0   Entered by:   Adella Hare LPN   Authorized by:   Syliva Overman MD   Signed by:   Adella Hare LPN on 74/25/9563   Method used:   Printed then faxed to ...       [facility]* (retail)       [address]       [address] Shipping/mailing       [place], [place]  [address]       Ph: [phone]       Fax: [phone]   RxID:   [id number] HYDROCHLOROTHIAZIDE 25 MG TABS (HYDROCHLOROTHIAZIDE) one tab by mouth qd  #90 Tablet x 0   Entered by:   Adella Hare LPN   Authorized by:   Syliva Overman MD   Signed by:   Adella Hare LPN on 70/62/3762   Method used:   Printed then faxed to ...       [facility]* (retail)       [address]       [address] Shipping/mailing       [place], [place]  [address]       Ph: [phone]       Fax: [phone]   RxID:   [id number] CALTRATE 600+D 600-400 MG-UNIT TABS (CALCIUM CARBONATE-VITAMIN D) Take 1 tablet by mouth two times a day  #180 x 0   Entered by:   Adella Hare LPN   Authorized by:   Syliva Overman MD   Signed by:   Adella Hare LPN on 16/96/7893   Method used:   Printed then faxed to ...       [facility]* (retail)       [address]       [address] Shipping/mailing       [place], [place]  [address]       Ph: [phone]       Fax: [phone]   RxID:   [id number] CLONIDINE HCL 0.3 MG TABS (CLONIDINE HCL) Take 1 tab by mouth at bedtime  #90 Tablet x 0   Entered by:   Adella Hare LPN   Authorized by:   Syliva Overman MD   Signed by:   Adella Hare LPN on 32/67/1245   Method used:   Printed then faxed to ...       [facility]* (retail)  [address]       [address] Shipping/mailing       [place], [place]  [address]       Ph: [phone]       Fax: [phone]   RxID:   [id number] AMLODIPINE BESYLATE 10 MG TABS (AMLODIPINE BESYLATE) Take 1 tablet by mouth once a day  #90 x 0   Entered by:   Adella Hare LPN   Authorized by:   Syliva Overman MD   Signed by:   Adella Hare LPN on 35/57/3220   Method used:   Printed then faxed to ...       [facility]* (retail)       [address]       [address] Shipping/mailing       [place], [place]  [address]       Ph: [phone]       Fax: [phone]   RxID:   [id number] FUROSEMIDE 20 MG  TABS (FUROSEMIDE) Take 1 tablet by mouth once a day as needed  #90 x 0   Entered by:   Adella Hare LPN   Authorized by:   Syliva Overman MD   Signed by:   Adella Hare LPN on 00/93/8182   Method used:   Printed then faxed to ...       [facility]* (retail)       [address]       [address] Shipping/mailing       [place], [place]  [address]       Ph: [phone]       Fax: [phone]   RxID:   [id number] PROTONIX 40 MG  TBEC (PANTOPRAZOLE SODIUM) Take 1 tablet by mouth once a day  #90 x 0   Entered by:   Adella Hare LPN   Authorized by:   Syliva Overman MD   Signed by:   Adella Hare LPN on 40/07/6760   Method used:   Printed then faxed to ...       [facility]* (retail)       [address]       [address] Shipping/mailing       [place], [place]  [address]       Ph: [phone]       Fax: [phone]   RxID:   [id number] ASPIRIN EC 81 MG  TBEC (ASPIRIN) Take 1 tablet by mouth once a day  #90 x 0   Entered by:   Adella Hare LPN   Authorized by:   Syliva Overman MD   Signed by:   Adella Hare LPN on 73/53/2992   Method used:   Printed then faxed to ...       [facility]* (retail)       [address]       [address] Shipping/mailing       [place], [place]  [address]       Ph: [phone]       Fax: [phone]   RxID:   [id number]  not sent to [place] faxed to [facility]

## 2011-02-19 ENCOUNTER — Encounter: Payer: Self-pay | Admitting: Family Medicine

## 2011-02-20 ENCOUNTER — Telehealth: Payer: Self-pay | Admitting: Family Medicine

## 2011-02-20 NOTE — Telephone Encounter (Signed)
Refilled metformin and test strips per patient request

## 2011-03-26 ENCOUNTER — Telehealth: Payer: Self-pay | Admitting: Family Medicine

## 2011-03-27 ENCOUNTER — Other Ambulatory Visit: Payer: Self-pay | Admitting: Family Medicine

## 2011-03-27 DIAGNOSIS — Z139 Encounter for screening, unspecified: Secondary | ICD-10-CM

## 2011-03-28 ENCOUNTER — Telehealth: Payer: Self-pay | Admitting: Family Medicine

## 2011-03-31 NOTE — Telephone Encounter (Deleted)
Error for phone note

## 2011-04-02 NOTE — Telephone Encounter (Signed)
Error rx pool do not work

## 2011-04-02 NOTE — Telephone Encounter (Signed)
error 

## 2011-04-11 ENCOUNTER — Ambulatory Visit (HOSPITAL_COMMUNITY): Payer: 59

## 2011-04-15 NOTE — Group Therapy Note (Signed)
NAME:  Tina Wilkins, Tina Wilkins              ACCOUNT NO.:  [id number]   MEDICAL RECORD NO.:  [id number]          PATIENT TYPE:  INP   LOCATION:  A206                          FACILITY:  [facility]   PHYSICIAN:  Skeet Latch, DO    DATE OF BIRTH:  11-Jan-1956   DATE OF PROCEDURE:  DATE OF DISCHARGE:                                 PROGRESS NOTE   SUBJECTIVE:  Tina Wilkins still has some chest discomfort that she states  is like a burning-type sensation in her chest.  The patient states that  when she stands, it feels like it goes to her lower abdomen region, but  she denies any nausea or vomiting at this time.   OBJECTIVE:  VITAL SIGNS:  Temperature is 97.2, pulse 66, respirations  18, blood pressure 114/76, she is satting 100% on room air.  CARDIOVASCULAR:  Regular rate and rhythm.  No rubs, gallops or murmurs.  LUNGS:  Clear to auscultation bilaterally.  No rales, rhonchi or  wheezing.  ABDOMEN:  Obese, soft, positive epigastric tenderness with some  associated right upper quadrant tenderness on deep palpation.  No  rigidity or guarding.  EXTREMITIES:  No clubbing, cyanosis or edema noted.  NEUROLOGICALLY:  She is alert and oriented x3, pleasant, cooperative.  Cranial nerves II-XII are grossly intact.   LABORATORY DATA:  Total creatinine kinase is 305, CK-MB 3.8, troponin  0.02, sodium 141, potassium 3.2, chloride is 103, CO2 31, glucose 120,  BUN 11, creatinine 0.88, white count is 8300, hemoglobin 10.7,  hematocrit 31.2, platelets 361, PTT 31, PT 14.0, INR 1.0.   DIAGNOSTICS:  Abdominal ultrasound showed fatty infiltration of the  liver with poor delineation of intrahepatic anatomy in detail.  Upper  normal CBD diameter correlated with LFTs.  Inadequate visualization of pancreas and aorta.  Better intra-abdominal  imaging with another CT of the abdomen with IV or oral contrast   ASSESSMENT/PLAN:  1. Chest pain, unknown etiology.  The patient has a CT of her chest      and abdomen ordered  by cardiology.  We will get an H. pylori test.      The patient did have a HIDA scan in the past that showed poor      ejection fraction.  Depending on results of CT of her abdomen and      chest, the patient may need a surgical evaluation regarding her      gallbladder to see if this is causing some of her pain.  We will      also change her Protonix to IV twice daily to see if this improves      some of her symptoms.  I appreciate cardiology recommendations at      this time.  We will follow up on echo, CT scan and labs.      Cardiology has adjusted her blood pressure medications at this      time.  2. For hyperlipidemia, we will continue her home medications.  3. For hypertension.  Also as stated, cardiology is adjusting her      medications at this time.  Her  blood pressure seems to be stable at      this time.  Any other recommendations to follow pending results of      her radiologic studies.      Skeet Latch, DO  Electronically Signed     SM/MEDQ  D:  03/16/2008  T:  03/16/2008  Job:  [id number]

## 2011-04-15 NOTE — H&P (Signed)
NAME:  Tina Wilkins, Tina Wilkins              ACCOUNT NO.:  [id number]   MEDICAL RECORD NO.:  [id number]          PATIENT TYPE:  INP   LOCATION:  A206                          FACILITY:  [facility]   PHYSICIAN:  Dorris Singh, DO    DATE OF BIRTH:  [birth year]   DATE OF ADMISSION:  03/15/2008  DATE OF DISCHARGE:  LH                              HISTORY & PHYSICAL   PRIMARY CARE PHYSICIAN:  Milus Mallick. Lodema Hong, M.D.   CHIEF COMPLAINT:  Chest pain.   HISTORY OF PRESENT ILLNESS:  The patient is a 55 year old woman who  presented to [facility] emergency room with a seven-day history of chest  pain.  She stated that it started off with a pressure on both sides of  her upper chest with a sharp shooting pain going to her jaw on the left  side.  She says that it has been going on, waxing and waning for several  days, and today it started to get worse.  Also she has noticed a  decrease in her appetite as well.  The patient states that today her  arms woke up very heavy.  She was able to lift them as well as with the  chest pressure as well stating that she even feels the pain radiating to  her shoulder blades.  She has significant risk factors with  hyperlipidemia and hypertension, and she has a history of CHF.   PAST MEDICAL HISTORY:  Significant for:  1. CHF.  2. Hypertension.  3. Hyperlipidemia.  4. GERD.  5. Bronchitis.   She has had a cesarean section and a partial hysterectomy.   She is a nonsmoker, nondrinker, no drug abuse.  Currently, the patient  is an employee in the [employer] system.  Her gravida status is she has  had three boys, two are currently living.   HER ALLERGIES ARE TO NOLAMINE.   MEDICATIONS:  1. Furosemide 20 mg once a day.  2. Avalide 300/25 mg once a day.  3. Lotrel 10/40 mg once a day.  4. Clonidine 0.2 mg at bedtime.  5. Aspirin 162 mg once a day.  6. Protonix 40 mg once a day.  7. Vytorin 10/80 at bedtime.   REVIEW OF SYSTEMS:  CONSTITUTIONAL:  Positive for  weakness and changes  in her appetite.  Eyes:  Negative for any visual changes or blurriness.  Ears:  Negative  for changes in hearing.  Nose:  Negative for changes in smell.  Mouth:  Negative for changes of taste.  Throat:  Negative for any masses or  positive for sharp shooting pain to the side of her jaw.  CARDIOVASCULAR:  Positive for chest pain, dyspnea and shortness of  breath.  RESPIRATORY:  Positive for shortness of breath on exertion.  GASTROINTESTINAL:  Positive for nausea.  GU:  Negative for dysuria or hesitancy.  MUSCULOSKELETAL:  Negative for back pain but positive for neck pain.  SKIN:  Negative for pruritus or rashes.  NEUROLOGIC:  Negative for syncope.  METABOLIC:  Negative for changes in hot or cold intolerance.  HEMATOLOGIC:  Negative for any anemia.  Her current vital signs today:  Her blood pressure is 102/59, pulse rate  75, respirations 18, temperature 98.7.  Her O2 sat is 98% on room air.  Generally, the patient is a well-developed, well-nourished African-  American woman who appears her stated age of record.  Head is normocephalic, atraumatic.  Eyes:  EOMI, PERRLA with the right  deviating to the lateral side conjunctivae within normal limits.  No  scleral icterus noted.  Her ears:  Tms are visualized bilaterally.  No  cerumen blockage.  Nose:  Turbinates are moist and patent.  Throat:  Teeth are in good repair.  No erythema or exudate noted.  NECK:  Supple, no carotid bruits or lymphadenopathy.  CARDIOVASCULAR:  Regular rate and rhythm.  There is a 2/6 systolic  ejection murmur.  RESPIRATORY:  Clear to auscultation bilaterally, no rales, wheezes or  rhonchi.  ABDOMEN:  Soft, nontender, nondistended, no masses noted.  Bowel sounds  present in all four quadrants.  EXTREMITIES:  Positive pulses, no edema, cyanosis or ecchymosis.  Strength is equal in all extremities.  NEUROLOGIC:  Cranial nerves II-XII are grossly intact.  The patient is A  + O x3.  SKIN:   Good turgor, good texture.  There are no rashes or tattoos noted.  LYMPH NODES:  There is no lymphadenopathy, axillary, cervical or  inguinal.   The patient's labs are as follows:  Her BNP is less than 30.  Her D-dimer is 0.42.  Her troponins are 0.01.  CK is 431, CK-MB 8.3.  Relative index is 1.9.  her sodium is 140,  potassium is 3.4, chloride is 102, carbon dioxide 28, glucose 105, BUN  15, creatinine 1.04.  Her chest x-ray showed no acute pulmonary process.  Her white count is 10.1, hemoglobin 11.5, hematocrit 34.1, and platelet  count 389.   ASSESSMENT/PLAN:  1. Chest pain.  2. Bilateral arm pain.  3. Weakness.   The patient will be admitted to the service of [facility] on 2A  Who will consult [facility] and set up a 2-D echo as well  as abdominal ultrasound due to the patient's previous history with this  type of pain in the past.  She has had previous examinations of her  gallbladder  but will go ahead and revisualize that at this point in time.  Will also  follow her with serial blood work and cardiac enzymes.  Will place her  on her home medication.  Will do GI and DVT prophylaxis and will  continue to follow her and await any recommendations that Cardiology may  have for her.      Dorris Singh, DO  Electronically Signed     CB/MEDQ  D:  03/15/2008  T:  03/16/2008  Job:  [id number]   cc:   Milus Mallick. Lodema Hong, M.D.  Fax: [phone]

## 2011-04-15 NOTE — Group Therapy Note (Signed)
NAME:  Tina Wilkins, Tina Wilkins NO.:  [id number]   MEDICAL RECORD NO.:  [id number]          PATIENT TYPE:  INP   LOCATION:  A206                          FACILITY:  [facility]   PHYSICIAN:  Skeet Latch, DO    DATE OF BIRTH:  [birth year]   DATE OF PROCEDURE:  03/17/2008  DATE OF DISCHARGE:                                 PROGRESS NOTE   SUBJECTIVE:  Ms. Schepp states that her chest discomfort is improving.  Patient continued to be on IV nitroglycerin.  Patient's PPI has also  been changed to IV twice daily.  Patient states that overall she is  feeling better and denies any nausea and vomiting.   OBJECTIVE:  VITAL SIGNS:  Temperature is 96.9, pulse 70, respirations  18, blood pressure 95/50.  CARDIOVASCULAR:  Regular rate and rhythm.  No rubs, gallops or murmurs.  LUNGS:  Clear to auscultation bilaterally.  No rales, rhonchi or  wheezing.  ABDOMEN:  Obese, soft, slight epigastric tenderness, no rigidity or  guarding, minimal bowel sounds.  EXTREMITIES:  No clubbing, cyanosis or edema.   LABS:  Sodium 139, potassium 3.3, chloride is 102, CO2 is 30, glucose  109, BUN 9, creatinine 0.92.  White count is 7400, hemoglobin 11.5,  hematocrit 33.6, platelets 351,000.  She had a hemoglobin A1c of 7.1,  TSH 1.725.  Her 2D echocardiogram was unremarkable also.   ASSESSMENT AND PLAN:  1. Chest pain, unknown etiology.  So far her CT of her chest and      abdomen were pretty much unremarkable.  Patient is still being      followed by Cardiology in case she continues to be on IV      nitroglycerin.  Await cardiac recommendations regarding any      invasive tests or any tests that can be done as an outpatient.      Overall, patient seems to be improving.  2. She has an elevated hemoglobin A1c.  Spoke with patient regarding      diet modification as well as lifestyle modification.  This needs to      be addressed by her primary care physician.  Patient needs to be on  antihyperglycemics or can do diet control.  3. Hypertension.  Seems to be stable at this time, will continue her      current treatment.   We will await recommendations from Cardiology regarding any procedures.  The patient can be discharged in the next 24 hours.      Skeet Latch, DO  Electronically Signed     SM/MEDQ  D:  03/17/2008  T:  03/17/2008  Job:  [id number]

## 2011-04-15 NOTE — Cardiovascular Report (Signed)
NAME:  Tina Wilkins, Tina Wilkins NO.:  [id number]   MEDICAL RECORD NO.:  [id number]           PATIENT TYPE:   LOCATION:                                 FACILITY:   PHYSICIAN:  Nicki Guadalajara, M.D.     DATE OF BIRTH:  03-18-1956   DATE OF PROCEDURE:  DATE OF DISCHARGE:                            CARDIAC CATHETERIZATION   CARDIAC CATHETERIZATION:  Left heart catheterization; cine coronary  angiography; left ventriculography; distal aortography.   INDICATIONS:  [name] is a 55 year old African American  female with a history of obesity, hypertension, hyperlipidemia, and  diabetes mellitus.  She has been experiencing recurrent episodes of  chest heaviness.  She was admitted to [facility].  Her symptoms  improved with nitroglycerin.  She was rounded on by Dr. Allyson Sabal this  morning after being originally seen in consultation by Dr. Domingo Sep.  She had a negative chest CT.  Cardiac enzymes were negative.  However,  because of cardiac risk factors and potentially worrisome sounding chest  pain, it was recommended that the patient be transferred to [facility] for definitive cardiac catheterization prior to potential  discharge.   PROCEDURE:  After premedication with Versed 2 mg intravenously, the  patient was prepped and draped in usual fashion.  Right femoral artery  was punctured anteriorly and a 5-French sheath was inserted.  Diagnostic  catheterization was done utilizing 5-French Judkins for right and left  catheters.  A 5-French pigtail catheter was used for left  ventriculography.  With the patient's hypertensive history, distal  aortography was also performed to make sure she did not have  renovascular etiology to her hypertension.  Hemostasis was obtained by  direct manual pressure.  She tolerated the procedure well.   HEMODYNAMIC DATA:  Central aortic pressure was 106/70.  Left ventricular  pressure was 106/6.   ANGIOGRAPHIC DATA:  Left main  coronary artery was angiographically  normal and bifurcated into an LAD and left circumflex system.   The LAD gave rise to two proximal diagonal vessels, moderate-sized  septal perforating artery and an additional mid diagonal vessel.  The  LAD wrapped around the LV apex and was angiographically normal.   The circumflex vessel was angiographically normal, gave rise to one  major marginal vessel.   The right coronary was angiographically normal, gave rise to the PDA  vessel and very small posterolateral system.   RAO ventriculography revealed normal global contractility without focal  segmental wall motion abnormalities.  Ejection fraction is greater than  55%.   Distal aortography revealed a normal aortoiliac system.  There was no  evidence for renal artery stenosis.   IMPRESSION:  1. Normal left ventricular function.  2. Normal coronary arteries.  3. Normal aortoiliac system.           ______________________________  Nicki Guadalajara, M.D.     TK/MEDQ  D:  03/17/2008  T:  03/18/2008  Job:  [id number]   cc:   Milus Mallick. Lodema Hong, M.D.  Nanetta Batty, M.D.

## 2011-04-18 NOTE — Procedures (Signed)
   NAME:  Tina Wilkins, Tina Wilkins                        ACCOUNT NO.:  [id number]   MEDICAL RECORD NO.:  [id number]                   PATIENT TYPE:  INP   LOCATION:  A210                                 FACILITY:  [facility]   PHYSICIAN:  Edward L. Juanetta Gosling, M.D.             DATE OF BIRTH:  Jul 25, 1956   DATE OF PROCEDURE:  03/07/2003  DATE OF DISCHARGE:  03/08/2003                                EKG INTERPRETATION   DATE AND TIME OF TEST:  March 07, 2003 at 0900.   FINDINGS:  The rhythm is a sinus rhythm with a rate of about 70.  There is  possible left anterior hemiblock.  There is what appears to be an incomplete  right bundle-branch block.  There is left atrial enlargement.  There are Q  waves anteriorly which could be due to a previous anterior infarction but  could also be related to incomplete bundle-branch block.   IMPRESSION:  Abnormal electrocardiogram.                                               Edward L. Juanetta Gosling, M.D.    ELH/MEDQ  D:  03/10/2003  T:  03/10/2003  Job:  [id number]

## 2011-04-18 NOTE — Discharge Summary (Signed)
NAME:  Tina Wilkins, Tina Wilkins              ACCOUNT NO.:  [id number]   MEDICAL RECORD NO.:  [id number]          PATIENT TYPE:  INP   LOCATION:  A206                          FACILITY:  [facility]   PHYSICIAN:  Skeet Latch, DO    DATE OF BIRTH:  Oct 05, 1956   DATE OF ADMISSION:  03/15/2008  DATE OF DISCHARGE:  04/17/2009LH                               DISCHARGE SUMMARY   DISCHARGE DIAGNOSES:  1. Chest pian.  2. Hyperlipidemia.  3. Hypertension.  4. History of congestive heart failure.  5. History of gastroesophageal reflux disease.   BRIEF HOSPITAL COURSE:  This is a 55 year old woman who presented to  [facility] ER with a 7-day history of chest pain.  The patient states it  started as a pressure on both sides of her upper chest with sharp  shooting pain going to her jaw on her left side.  She states that there  was waxing and waning for several days.  On the day of admission, it  started to get worse.  She notes she has also had a decrease in  appetite; and when she woke up on the day of admission, her arms were  very heavy.  She also stated that chest pressure felt like it was  radiating to her shoulder blades.  The patient does have a history of  CHF, hyperlipidemia, and hypertension.  The patent was seen through the  emergency room.  Her initial labs showed a BMP less than 30 with D-dimer  of 0.42.  Troponins were 0.01.  CK 431, CK-MB 8.3.  Sodium 140,  potassium 3.4, chloride 102, CO2 28, glucose 105, BUN 15, and creatinine  1.04.  Chest x-ray showed no acute  pulmonary processes.  She had no  elevated white count.  She was slightly anemic at 11.5, hematocrit 34.1,  platelet count was 389.  She was seen, and she was admitted to the  service of [facility].  [facility] Cardiology was consulted.  She had  an ultrasound of her abdomen, which showed fatty infiltrations of the  liver with poor delineation of the anterior hepatic anatomy in detail.  Inadequate visualization of the pancreas and  aorta.  On March 16, 2008,  she had a CT of her chest performed, which showed a minimal right lower  lobe atelectasis.  No acute intrathoracic abnormalities were identified.  CT of her abdomen again showed:  1. Fatty infiltration of the liver.  2. Mildly prominent right renal collecting system without definite      evidence of obstruction.   Cardiology evaluated the patient secondary to her risk factors.  They  felt the patient needed a cardiac catheterization.  Her symptoms were  worse for unstable angina; and on March 17, 2008, the patient was  transferred to [facility].   Her vitals on discharge; temperature 98.0, pulse 83, respirations 20,  blood pressure 95/65.  She is sating 94% on room air.   LABORATORY DATA:  On discharge; sodium 139, potassium 3.3, chloride 102,  CO2 30, glucose 109, BUN 9, and creatinine 0.92.  White count 7.4,  hemoglobin 11.5, hematocrit 33.6,  and platelet count 351.  The patient  did have hemoglobin A1c of 7.1.   The patient's condition on transfer was table.  She was transferred to  [facility].   DISCHARGE INSTRUCTIONS:  The patient was told to follow up with her  primary care physician upon discharge.  The patient was told that she  needs to follow up her hemoglobin A1c, it was elevated.  The patient is  to maintain a low-salt and heart-healthy diet also.      Skeet Latch, DO  Electronically Signed     SM/MEDQ  D:  04/18/2008  T:  04/19/2008  Job:  [id number]   cc:   Dr. Lodema Hong

## 2011-04-18 NOTE — H&P (Signed)
NAME:  Tina Wilkins, Tina Wilkins              ACCOUNT NO.:  [id number]   MEDICAL RECORD NO.:  [id number]          PATIENT TYPE:  AMB   LOCATION:  DAY                           FACILITY:  [facility]   PHYSICIAN:  Dirk Dress. Katrinka Blazing, M.D.   DATE OF BIRTH:  07/25/1956   DATE OF ADMISSION:  DATE OF DISCHARGE:  LH                                HISTORY & PHYSICAL   A 55 year old female with a history of guaiac positive stool.  She has  chronic lower abdominal pain.  She also has episodic epigastric pain.  She  has not had nausea or vomiting.  She has bowel movements on a daily basis.  She will have a colonoscopy.   PAST HISTORY:  1.  She has hypertension.  2.  Chronic bronchitis.  3.  Osteoarthritis.   MEDICATIONS:  1.  Mucinex every day.  2.  Protonix 40 mg every day.  3.  Aspirin 81 mg every day.  4.  Lipitor 40 mg every day.  5.  Premarin 0.9 mg every day.  6.  Lasix 20 mg every day.  7.  Avalide 300/12.5 mg every day.   SURGERY:  1.  C-section.  2.  Left shoulder surgery.   PHYSICAL EXAMINATION:  VITAL SIGNS:  Blood pressure 134/86, pulse 76,  respirations 18, weight 250 pounds.  HEENT:  Unremarkable.  NECK:  Supple.  No JVD, bruit, adenopathy, or thyromegaly.  CHEST:  Clear to auscultation.  HEART:  Regular rate and rhythm without murmur, gallop or rub.  ABDOMEN:  Obese, mild bilateral lower quadrant tenderness.  Normoactive  bowel sounds.  EXTREMITIES:  No cyanosis, clubbing, or edema.  NEUROLOGIC:  No focal motor, sensory, or cerebellar deficit.   IMPRESSION:  1.  Guaiac positive stools.  2.  Chronic lower abdominal pain.  3.  Hypertension.  4.  Osteoarthritis.  5.  Chronic bronchitis.   PLAN:  Screening colonoscopy.      LCS/MEDQ  D:  03/12/2005  T:  03/13/2005  Job:  [id number]

## 2011-04-18 NOTE — Procedures (Signed)
   NAME:  Tina Wilkins, Tina Wilkins                        ACCOUNT NO.:  [id number]   MEDICAL RECORD NO.:  [id number]                   PATIENT TYPE:  INP   LOCATION:  A210                                 FACILITY:  [facility]   PHYSICIAN:  Edward L. Juanetta Gosling, M.D.             DATE OF BIRTH:  08-24-1956   DATE OF PROCEDURE:  DATE OF DISCHARGE:                                EKG INTERPRETATION   The computer has registered wandering pacer, but with the findings it  appears to be in sinus rhythm to me. 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  There is ST elevation in the anterior leads which could be related to  infarction and clinical correlation was suggested.  It could also represent  an early repolarization pattern. Abnormal electrocardiogram.                                               Oneal Deputy. Juanetta Gosling, M.D.    ELH/MEDQ  D:  03/07/2003  T:  03/08/2003  Job:  [id number]

## 2011-04-18 NOTE — Procedures (Signed)
   NAME:  Tina Wilkins, Tina Wilkins                        ACCOUNT NO.:  [id number]   MEDICAL RECORD NO.:  [id number]                   PATIENT TYPE:  INP   LOCATION:  A210                                 FACILITY:  [facility]   PHYSICIAN:  Kem Boroughs, M.D.                 DATE OF BIRTH:  [birth year]   DATE OF PROCEDURE:  03/08/2003  DATE OF DISCHARGE:                                    STRESS TEST   PROCEDURE DICTATED:  Adenosine stress test - stress portion.   INDICATIONS FOR PROCEDURE:  The patient is a 55 year old female with past  medical history of hypertension who was admitted with chest pain.  Subsequent cardiac enzymes were negative for cardiac damage.  Her  electrocardiogram was somewhat abnormal.  She is referred for a nuclear  stress test today.   DESCRIPTION OF PROCEDURE:  Adenosine was infused per protocol.   Electrocardiographic/hemodynamic data:  The resting blood pressure was  128/78 with a pulse of 76 beats per minute.  EKG revealed normal sinus  rhythm, left axis deviation, left atrial abnormality, and poor R wave  progression with late transition.  There is a suggestion of very subtle ST  elevation in V1 and V2.   With infusion of adenosine she developed an accelerated junctional rhythm.  She had symptoms of some flushing but no reproducibility of her chest  discomfort.  Cardiolite was injected per protocol.  Within one minute of  completion of the adenosine infusion her EKG normalized.  Peak heart rate  was 108 beats per minute.  There was no significant change in her blood  pressure.   IMPRESSION:  1. EKG negative for ischemia.  2. Scintigraphic images are pending.  3. No immediate complications.                                               Kem Boroughs, M.D.    TB/MEDQ  D:  03/08/2003  T:  03/08/2003  Job:  [id number]

## 2011-04-18 NOTE — Discharge Summary (Signed)
   NAME:  Tina Wilkins, STAFF NO.:  [id number]   MEDICAL RECORD NO.:  [id number]                   PATIENT TYPE:  INP   LOCATION:  A210                                 FACILITY:  [facility]   PHYSICIAN:  Sarita Bottom, M.D.                  DATE OF BIRTH:  1956/08/02   DATE OF ADMISSION:  03/07/2003  DATE OF DISCHARGE:                                 DISCHARGE SUMMARY   DISCHARGE DIAGNOSES:  1. Chest pain.  2. Acute myocardial infarction rule out chest pain probably not related to     cardiac origin.  3. Other problems include hypertension.   DISCHARGE MEDICATIONS:  1. Patient to try a proton block inhibitor, Protonix 40 mg daily.  2. Patient to also go home on aspirin 81 mg daily.  3. Patient to continue with present-admission medications.   CONDITION ON DISCHARGE:  Stable.   FOLLOW UP:  Include monitoring patient's lipid profile and TSH.   HISTORY OF PRESENT ILLNESS:  The patient is a 55 year old lady with a  history of hypertension.  She was admitted to the hospital on the 6th of  this month when she presented with a complaint of intermittent chest pain,  retrosternal in location.  It sometimes radiates into the neck.   ADMISSION PHYSICAL EXAMINATION:  VITAL SIGNS:  Revealed a BP 170/70, heart  rate 72.  CHEST:  Clear to auscultation.  CARDIOVASCULAR:  Heart sounds S1 and S2 are normal.  Rhythm is regular.  No  murmurs were heard.   ADMISSION LABORATORY INVESTIGATION:  Shows a normal LFTs, normal  B-MET.  Serial cardiac enzymes were negative for any acute MI. EKGs were done which  essentially revealed a first degree AV block.  There were no acute ST-T wave  changes.   HOSPITAL COURSE:  Patient was seen by a cardiology consult.  A 2D echo was  done which revealed a normal  LV function and no regional wall motion  abnormalities.  Patient also had a stress test done which was essentially  normal .   The patient's clinical course has been essentially  unremarkable.  Selective  BP 106/53 with a heart rate of 67.  The temperature is 98.7.  His  cardiovascular exam is essentially unchanged and the patient will definitely  discharged home today to follow up with her primary M.D., Dr. Syliva Overman.                                               Sarita Bottom, M.D.    DW/MEDQ  D:  03/08/2003  T:  03/08/2003  Job:  [id number]   cc:   Milus Mallick. Lodema Hong, M.D.  [address]  [place], [place] [address]  Fax: [phone]

## 2011-04-18 NOTE — H&P (Signed)
NAME:  Tina Wilkins, Tina Wilkins NO.:  [id number]   MEDICAL RECORD NO.:  [id number]                   PATIENT TYPE:  INP   LOCATION:  A210                                 FACILITY:  [facility]   PHYSICIAN:  Sarita Bottom, M.D.                  DATE OF BIRTH:  [birth year]   DATE OF ADMISSION:  03/07/2003  DATE OF DISCHARGE:                                HISTORY & PHYSICAL   CHIEF COMPLAINT:  I have chest pain.   HISTORY OF PRESENT ILLNESS:  The patient is a 55 year old lady with a  history of hypertension.  She was well until about two weeks ago when she  noticed on and off retrosternal chest pain.  The pain comes on and radiates  to the  neck.  Rated it about a 7/10.  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  She  subsequently called her primary doctor who asked her to fax the EKG and then  asked her to come to the  emergency room after she reviewed the EKG.  She  presently does not have any more chest pain.  She said the pain is not  related to any exertion, sometimes associated with shortness of breath,  palpitations, but no nausea or vomiting.   REVIEW OF SYSTEMS:  She denies any fever.  She denies any  weakness.  She  feels hot and cold sometimes.  RESPIRATORY:  Denies any shortness of breath  or cough at the moment.  CVS:  Denies any palpitations or chest pain at the  moment.  GI:  No nausea or vomiting.  GENITOURINARY:  No dysuria.  She has  amenorrhea for about one year now.  MUSCULOSKELETAL:  She has back pain on  and off.  PSYCHIATRIC:  She feels anxious because of the death of her child.   PAST MEDICAL HISTORY:  She has hypertension.  She has a history of chronic  back pain.  She has a history of C-section with shoulder surgery.   CURRENT MEDICATIONS:  Include Avalide 150/12.5 daily, Norvasc 10 mg daily.   ALLERGIES:  She says she is allergic to Novolin.   FAMILY HISTORY:  Her child died from a  seizure disorder.  Her mother  suffered a heart attack at age 38.   SOCIAL HISTORY:  She is married with two living children.  She does not  smoke or drink alcohol.   PHYSICAL EXAMINATION:  VITAL SIGNS:  Blood pressure 170/70, heart rate 71.  Patient is afebrile.  GENERAL:  She is a middle-aged lady, lying comfortably in bed, anxious but  not in any distress.  HEAD/EARS/NOSE AND THROAT:  She is not pale.  She is anicteric.  She has  moist oral mucosa.  NECK:  Supple.  She has no thyromegaly.  She has no carotid bruits.  CHEST:  Air entry is good bilateral.  Breath sounds are vesicular.  No  wheezes or crackles were heard.  CV:  Heart sounds S1, S2 are normal .  Rhythm is regular.  No murmurs were  heard.  ABDOMEN:  Obese.  Bowel sounds are present.  No tenderness on deep  palpation.  CNS:  She is alert and oriented  x3.  EXTREMITIES:  She has no edema.   LABORATORY/DIAGNOSTICS:  Sodium is 138, potassium is 3.7, chloride is 102,  CO2 is 21, BUN is 7, creatinine is 0.8, glucose is 96.  Calcium is 9.1.  WBCs 8.2, hemoglobin 11.9, MCV is 85.  CK is 2162, troponin is 0.01, MB  fraction is 3.4.   Her EKG shows a sinus rhythm at 63 beats per minute with third-degree AV  block.  She has a left axis deviation.  She has some small Q waves in lead  aVL.   ASSESSMENT/PLAN:  1. Chest pain, probably unstable angina.  The patient will be admitted to     telemetry.  She will be given aspirin and a beta-blocker, also given     nitroglycerin p.r.n. and I will give her Lovenox 1 mg/kg q.12h.  I will     call and do serial cardiac enzymes to rule out acute myocardial     infarction.  Call cardiology for further assessment and evaluation.  2. Acute hypertension.  Patient will be resumed on her Norvasc 10 mg,     Avalide 150/12.5, and her BP will be monitored and medications adjusted     accordingly.  Patient will be admitted under the hospitalist service.     Further work up and management will  depend on clinical course.                                                 Sarita Bottom, M.D.    DW/MEDQ  D:  03/07/2003  T:  03/07/2003  Job:  [id number]

## 2011-04-24 ENCOUNTER — Ambulatory Visit (HOSPITAL_COMMUNITY): Payer: 59

## 2011-05-20 ENCOUNTER — Encounter: Payer: Self-pay | Admitting: Family Medicine

## 2011-05-21 ENCOUNTER — Encounter: Payer: Commercial Managed Care - PPO | Admitting: Family Medicine

## 2011-05-27 ENCOUNTER — Other Ambulatory Visit: Payer: Self-pay | Admitting: Family Medicine

## 2011-05-27 LAB — LIPID PANEL
Cholesterol: 118 mg/dL (ref 0–200)
HDL: 47 mg/dL (ref >39)
LDL Cholesterol: 60 mg/dL (ref 0–99)
Triglycerides: 55 mg/dL (ref <150)
VLDL: 11 mg/dL (ref 0–40)

## 2011-05-28 ENCOUNTER — Other Ambulatory Visit: Payer: Self-pay | Admitting: Family Medicine

## 2011-05-28 LAB — VITAMIN D 25 HYDROXY (VIT D DEFICIENCY, FRACTURES): Vit D, 25-Hydroxy: 43 ng/mL (ref 30–89)

## 2011-05-28 LAB — COMPLETE METABOLIC PANEL WITH GFR
ALT: 22 U/L (ref 0–35)
AST: 18 U/L (ref 0–37)
BUN: 10 mg/dL (ref 6–23)
Calcium: 9.5 mg/dL (ref 8.4–10.5)
Creat: 0.81 mg/dL (ref 0.50–1.10)
GFR, Est African American: >60 mL/min (ref >60)
Total Bilirubin: 0.7 mg/dL (ref 0.3–1.2)

## 2011-06-24 ENCOUNTER — Other Ambulatory Visit: Payer: Self-pay | Admitting: Family Medicine

## 2011-07-07 ENCOUNTER — Ambulatory Visit (HOSPITAL_COMMUNITY)
Admission: RE | Admit: 2011-07-07 | Discharge: 2011-07-07 | Disposition: A | Discharge status: Home / Self Care | Payer: 59 | Source: Ambulatory Visit | Attending: Family Medicine | Admitting: Family Medicine

## 2011-07-07 DIAGNOSIS — Z1231 Encounter for screening mammogram for malignant neoplasm of breast: Secondary | ICD-10-CM | POA: Insufficient documentation

## 2011-07-07 DIAGNOSIS — Z139 Encounter for screening, unspecified: Secondary | ICD-10-CM

## 2011-07-17 ENCOUNTER — Encounter: Payer: Self-pay | Admitting: Family Medicine

## 2011-07-18 ENCOUNTER — Other Ambulatory Visit (HOSPITAL_COMMUNITY)
Admission: RE | Admit: 2011-07-18 | Discharge: 2011-07-18 | Disposition: A | Discharge status: Home / Self Care | Payer: 59 | Source: Ambulatory Visit | Attending: Family Medicine | Admitting: Family Medicine

## 2011-07-18 ENCOUNTER — Ambulatory Visit (INDEPENDENT_AMBULATORY_CARE_PROVIDER_SITE_OTHER): Payer: 59 | Admitting: Family Medicine

## 2011-07-18 ENCOUNTER — Encounter: Payer: Self-pay | Admitting: Family Medicine

## 2011-07-18 VITALS — BP 128/84 | HR 77 | Ht 63.0 in | Wt 253.1 lb

## 2011-07-18 DIAGNOSIS — E119 Type 2 diabetes mellitus without complications: Secondary | ICD-10-CM

## 2011-07-18 DIAGNOSIS — Z124 Encounter for screening for malignant neoplasm of cervix: Secondary | ICD-10-CM

## 2011-07-18 DIAGNOSIS — Z1211 Encounter for screening for malignant neoplasm of colon: Secondary | ICD-10-CM

## 2011-07-18 DIAGNOSIS — I10 Essential (primary) hypertension: Secondary | ICD-10-CM

## 2011-07-18 DIAGNOSIS — B3749 Other urogenital candidiasis: Secondary | ICD-10-CM

## 2011-07-18 DIAGNOSIS — B3789 Other sites of candidiasis: Secondary | ICD-10-CM | POA: Insufficient documentation

## 2011-07-18 DIAGNOSIS — E669 Obesity, unspecified: Secondary | ICD-10-CM

## 2011-07-18 DIAGNOSIS — E785 Hyperlipidemia, unspecified: Secondary | ICD-10-CM

## 2011-07-18 DIAGNOSIS — Z0000 Encounter for general adult medical examination without abnormal findings: Secondary | ICD-10-CM

## 2011-07-18 DIAGNOSIS — Z01419 Encounter for gynecological examination (general) (routine) without abnormal findings: Secondary | ICD-10-CM | POA: Insufficient documentation

## 2011-07-18 LAB — POC HEMOCCULT BLD/STL (OFFICE/1-CARD/DIAGNOSTIC): Fecal Occult Blood, POC: NEGATIVE

## 2011-07-18 MED ORDER — AMLODIPINE BESYLATE 10 MG PO TABS: 10.0000 mg | ORAL_TABLET | Freq: Every day | ORAL | Status: DC

## 2011-07-18 MED ORDER — PANTOPRAZOLE SODIUM 40 MG PO TBEC: 40.0000 mg | DELAYED_RELEASE_TABLET | Freq: Every day | ORAL | Status: DC

## 2011-07-18 MED ORDER — ROSUVASTATIN CALCIUM 40 MG PO TABS: 40.0000 mg | ORAL_TABLET | Freq: Every day | ORAL | Status: DC

## 2011-07-18 MED ORDER — HYDROCHLOROTHIAZIDE 25 MG PO TABS: 25.0000 mg | ORAL_TABLET | Freq: Every day | ORAL | Status: DC

## 2011-07-18 MED ORDER — BENAZEPRIL HCL 40 MG PO TABS: 40.0000 mg | ORAL_TABLET | Freq: Every day | ORAL | Status: DC

## 2011-07-18 MED ORDER — NYSTATIN 100000 UNIT/GM EX POWD: CUTANEOUS | Status: AC

## 2011-07-18 MED ORDER — ASPIRIN 81 MG PO TABS: 81.0000 mg | ORAL_TABLET | Freq: Every day | ORAL | Status: DC

## 2011-07-18 MED ORDER — CLONIDINE HCL 0.3 MG PO TABS: 0.3000 mg | ORAL_TABLET | Freq: Every day | ORAL | Status: DC

## 2011-07-18 MED ORDER — FLUCONAZOLE 150 MG PO TABS: 150.0000 mg | ORAL_TABLET | Freq: Once | ORAL | Status: AC

## 2011-07-18 NOTE — Patient Instructions (Addendum)
F/U in mid November.  Fasting labs just before visit. LABWORK  NEEDS TO BE DONE BETWEEN 3 TO 7 DAYS BEFORE YOUR NEXT SCEDULED  VISIT.  THIS WILL IMPROVE THE QUALITY OF YOUR CARE.   Med is sent in for rash under breasts to [facility]   It is important that you exercise regularly at least 30 minutes 5 times a week. If you develop chest pain, have severe difficulty breathing, or feel very tired, stop exercising immediately and seek medical attention    A healthy diet is rich in fruit, vegetables and whole grains. Poultry fish, nuts and beans are a healthy choice for protein rather then red meat. A low sodium diet and drinking 64 ounces of water daily is generally recommended. Oils and sweet should be limited. Carbohydrates especially for those who are diabetic or overweight, should be limited to 34-45 gram per meal. It is important to eat on a regular schedule, at least 3 times daily. Snacks should be primarily fruits, vegetables or nuts.   Weight loss goal of 5 pounds or more  Med is sent in for your rash.  Please call and attend a class to improve your carbohydrate counting skills

## 2011-07-18 NOTE — Progress Notes (Signed)
  Subjective:    Patient ID: Tina Wilkins, female    DOB: 1956/01/29, 55 y.o.   MRN: [id number]  HPI The PT is here for annual exam and re-evaluation of chronic medical conditions, medication management and review of any available recent lab and radiology data.  Preventive health is updated, specifically  Cancer screening and Immunization.   Questions or concerns regarding consultations or procedures which the PT has had in the interim are  addressed. The PT denies any adverse reactions to current medications since the last visit.    Pruritic rash under breasts x 2 weeks , improving with homme remedy. Tests once daily on average, fastings are between 120 to 130. Last eye exam in 2011, no diabetic neuropathy reportedly, next appt is in 1 week      Review of Systems Denies recent fever or chills. Denies sinus pressure, nasal congestion, ear pain or sore throat. Denies chest congestion, productive cough or wheezing. Denies chest pains, palpitations and leg swelling Denies abdominal pain, nausea, vomiting,diarrhea or constipation.   Denies dysuria, frequency, hesitancy or incontinence. Chronic left knee pain, no instability most of the time.Recent fall increased pain temporarily, but this has subsided Denies headaches, seizures, numbness, or tingling. Denies depression, anxiety or insomnia.         Objective:   Physical Exam  Pleasant well nourished female, alert and oriented x 3, in no cardio-pulmonary distress. Afebrile. HEENT No facial trauma or asymetry. Sinuses non tender.  EOMI, PERTL, fundoscopic exam is normal, no hemorhage or exudate.  External ears normal, tympanic membranes clear. Oropharynx moist, no exudate, good dentition. Neck: supple, no adenopathy,JVD or thyromegaly.No bruits.  Chest: Clear to ascultation bilaterally.No crackles or wheezes. Non tender to palpation  Breast: No asymetry,no masses. No nipple discharge or inversion. No axillary or  supraclavicular adenopathy  Cardiovascular system; Heart sounds normal,  S1 and  S2 ,no S3.  No murmur, or thrill. Apical beat not displaced Peripheral pulses normal.  Abdomen: Soft, non tender, no organomegaly or masses. No bruits. Bowel sounds normal. No guarding, tenderness or rebound.  Rectal:  No mass. Guaiac negative stool.  GU: External genitalia normal. No lesions. Vaginal canal normal.No discharge. Uterus absent, no adnexal masses, no  adnexal tenderness.  Musculoskeletal exam: Full ROM of spine, hips , shoulders and knees. No deformity ,swelling or crepitus noted. No muscle wasting or atrophy.   Neurologic: Cranial nerves 2 to 12 intact. Power, tone ,sensation and reflexes normal throughout. No disturbance in gait. No tremor.  Skin: Candidiasis under breasts  Psych; Normal mood and affect. Judgement and concentration normal       Assessment & Plan:

## 2011-07-19 NOTE — Assessment & Plan Note (Signed)
Controlled, no change in medication Target of 6.5 or less

## 2011-07-19 NOTE — Assessment & Plan Note (Signed)
Controlled, no change in medication  

## 2011-07-19 NOTE — Assessment & Plan Note (Signed)
Recent flare, nystatin and fluconazole. Advised pt to keep area dry

## 2011-07-19 NOTE — Assessment & Plan Note (Signed)
Deteriorated. Patient re-educated about  the importance of commitment to a  minimum of 150 minutes of exercise per week. The importance of healthy food choices with portion control discussed. Encouraged to start a food diary, count calories and to consider  joining a support group. Sample diet sheets offered. Goals set by the patient for the next several months.    

## 2011-07-23 ENCOUNTER — Encounter: Payer: Self-pay | Admitting: *Deleted

## 2011-08-05 ENCOUNTER — Telehealth: Payer: Self-pay | Admitting: Family Medicine

## 2011-08-05 MED ORDER — GLUCOSE BLOOD VI STRP: ORAL_STRIP | Status: DC

## 2011-08-05 MED ORDER — ALCOHOL PREPS PADS: MEDICATED_PAD | Status: AC

## 2011-08-05 NOTE — Telephone Encounter (Signed)
Sent in and patient aware 

## 2011-08-26 LAB — COMPREHENSIVE METABOLIC PANEL
ALT: 21
AST: 22
Albumin: 4
Alkaline Phosphatase: 96
Chloride: 102
Potassium: 3.4 — ABNORMAL LOW
Sodium: 140
Total Bilirubin: 0.5
Total Protein: 7.4

## 2011-08-26 LAB — BASIC METABOLIC PANEL
BUN: 11
BUN: 9
CO2: 31
Calcium: 9.2
Chloride: 102
Glucose, Bld: 120 — ABNORMAL HIGH
Potassium: 3.2 — ABNORMAL LOW
Potassium: 3.3 — ABNORMAL LOW
Sodium: 141

## 2011-08-26 LAB — DIFFERENTIAL
Basophils Relative: 1
Eosinophils Absolute: 0.2
Eosinophils Absolute: 0.3
Eosinophils Relative: 3
Eosinophils Relative: 3
Lymphocytes Relative: 27
Lymphs Abs: 2
Lymphs Abs: 2.2
Lymphs Abs: 2.5
Monocytes Absolute: 0.4
Monocytes Absolute: 0.5
Monocytes Relative: 5
Monocytes Relative: 6
Neutro Abs: 5.2

## 2011-08-26 LAB — H. PYLORI ANTIBODY, IGG: H Pylori IgG: 1.4 — ABNORMAL HIGH

## 2011-08-26 LAB — C-REACTIVE PROTEIN: CRP: 1 — ABNORMAL HIGH (ref <0.6)

## 2011-08-26 LAB — LIPID PANEL
Cholesterol: 140
LDL Cholesterol: 77
Triglycerides: 59

## 2011-08-26 LAB — CARDIAC PANEL(CRET KIN+CKTOT+MB+TROPI)
CK, MB: 3.8
Relative Index: 1.2
Relative Index: 1.2
Relative Index: 1.3
Troponin I: 0.02

## 2011-08-26 LAB — CK TOTAL AND CKMB (NOT AT ARMC)
CK, MB: 8.3 — ABNORMAL HIGH
Relative Index: 1.9

## 2011-08-26 LAB — APTT: aPTT: 31

## 2011-08-26 LAB — CBC
HCT: 33.6 — ABNORMAL LOW
HCT: 34.1 — ABNORMAL LOW
Hemoglobin: 10.7 — ABNORMAL LOW
MCV: 86.3
Platelets: 351
Platelets: 389
RBC: 3.65 — ABNORMAL LOW
RDW: 14.9
WBC: 7.4
WBC: 9.1

## 2011-08-26 LAB — HEMOGLOBIN A1C: Mean Plasma Glucose: 175

## 2011-08-26 LAB — TROPONIN I: Troponin I: 0.01

## 2011-08-26 LAB — TSH: TSH: 1.725

## 2011-09-23 ENCOUNTER — Other Ambulatory Visit: Payer: Self-pay | Admitting: Family Medicine

## 2011-10-21 ENCOUNTER — Encounter: Payer: Self-pay | Admitting: Family Medicine

## 2011-10-22 ENCOUNTER — Ambulatory Visit (INDEPENDENT_AMBULATORY_CARE_PROVIDER_SITE_OTHER): Payer: 59 | Admitting: Family Medicine

## 2011-10-22 VITALS — BP 140/90 | HR 68 | Resp 18 | Ht 63.0 in | Wt 244.0 lb

## 2011-10-22 DIAGNOSIS — E785 Hyperlipidemia, unspecified: Secondary | ICD-10-CM

## 2011-10-22 DIAGNOSIS — R5381 Other malaise: Secondary | ICD-10-CM

## 2011-10-22 DIAGNOSIS — J329 Chronic sinusitis, unspecified: Secondary | ICD-10-CM

## 2011-10-22 DIAGNOSIS — I10 Essential (primary) hypertension: Secondary | ICD-10-CM

## 2011-10-22 DIAGNOSIS — E119 Type 2 diabetes mellitus without complications: Secondary | ICD-10-CM

## 2011-10-22 DIAGNOSIS — J019 Acute sinusitis, unspecified: Secondary | ICD-10-CM | POA: Insufficient documentation

## 2011-10-22 DIAGNOSIS — E669 Obesity, unspecified: Secondary | ICD-10-CM

## 2011-10-22 LAB — COMPLETE METABOLIC PANEL WITH GFR
Alkaline Phosphatase: 89 U/L (ref 39–117)
BUN: 11 mg/dL (ref 6–23)
Creat: 0.75 mg/dL (ref 0.50–1.10)
GFR, Est Non African American: 89 mL/min
Glucose, Bld: 101 mg/dL — ABNORMAL HIGH (ref 70–99)
Sodium: 142 mEq/L (ref 135–145)
Total Bilirubin: 0.7 mg/dL (ref 0.3–1.2)

## 2011-10-22 LAB — LIPID PANEL
HDL: 47 mg/dL (ref >39)
LDL Cholesterol: 60 mg/dL (ref 0–99)
Total CHOL/HDL Ratio: 2.5 Ratio
VLDL: 11 mg/dL (ref 0–40)

## 2011-10-22 LAB — TSH: TSH: 2.036 u[IU]/mL (ref 0.350–4.500)

## 2011-10-22 MED ORDER — AZITHROMYCIN 250 MG PO TABS: ORAL_TABLET | ORAL | Status: DC

## 2011-10-22 MED ORDER — AZITHROMYCIN 250 MG PO TABS: ORAL_TABLET | ORAL | Status: AC

## 2011-10-22 MED ORDER — FLUCONAZOLE 150 MG PO TABS: 150.0000 mg | ORAL_TABLET | Freq: Once | ORAL | Status: AC

## 2011-10-22 NOTE — Assessment & Plan Note (Signed)
Uncontrolled, no med change at this time, rept in 4 months , adjust at that time if needed

## 2011-10-22 NOTE — Assessment & Plan Note (Signed)
Improved. Pt applauded on succesful weight loss through lifestyle change, and encouraged to continue same. Weight loss goal set for the next several months.  

## 2011-10-22 NOTE — Progress Notes (Signed)
  Subjective:    Patient ID: Tina Wilkins, female    DOB: [birth year], 55 y.o.   MRN: [id number]  HPI 1 week h/o horaseness, sinus pressure and post nasal  Drainage, green post nasal drainage , intermittent fever and chills , no cough. Tets  Blood sugar daily, fasting averages about 110. Pt hjas been working diligently as far as diet is concerned with excellnt weight loss, she feels much better also   Review of Systems    See HPI Denies chest pains, palpitations and leg swelling Denies abdominal pain, nausea, vomiting,diarrhea or constipation.   Denies dysuria, frequency, hesitancy or incontinence. Denies joint pain, swelling and limitation in mobility. Denies headaches, seizures, numbness, or tingling. Denies depression, anxiety or insomnia. Denies skin break down or rash.     Objective:   Physical Exam Patient alert and oriented and in no cardiopulmonary distress.  HEENT: No facial asymmetry, EOMI,maxillary  sinus tenderness,  oropharynx pink and moist.  Neck supple right anterior  adenopathy.  Chest: Clear to auscultation bilaterally.  CVS: S1, S2 no murmurs, no S3.  ABD: Soft non tender. Bowel sounds normal.  Ext: No edema  MS: Adequate ROM spine, shoulders, hips and knees.  Skin: Intact, no ulcerations or rash noted.  Psych: Good eye contact, normal affect. Memory intact not anxious or depressed appearing.  CNS: CN 2-12 intact, power, tone and sensation normal throughout.        Assessment & Plan:

## 2011-10-22 NOTE — Assessment & Plan Note (Signed)
Controlled, no change in medication Labs today

## 2011-10-22 NOTE — Patient Instructions (Addendum)
F/u in 3 months.  Med is at [facility] for sinus infection.  Congrats on 9 pound weight loss , pls continue to aim for a 3 to 4 pound weight loss per month   Blood pressure is elevated today, pls follow DASH diet , commit to exercise 30 minutes every day, and reduce salt intake.  HBA1C,CMP and EGFR, lipid and TSH today and cbc  I

## 2011-10-22 NOTE — Assessment & Plan Note (Signed)
Hyperlipidemia:Low fat diet discussed and encouraged.  Controlled, no change in medication   

## 2011-10-22 NOTE — Assessment & Plan Note (Signed)
Acute infection with laryngitis

## 2011-10-26 ENCOUNTER — Encounter: Payer: Self-pay | Admitting: Family Medicine

## 2012-01-21 ENCOUNTER — Other Ambulatory Visit: Payer: Self-pay | Admitting: Family Medicine

## 2012-01-21 ENCOUNTER — Encounter: Payer: Self-pay | Admitting: Family Medicine

## 2012-01-21 ENCOUNTER — Ambulatory Visit (INDEPENDENT_AMBULATORY_CARE_PROVIDER_SITE_OTHER): Payer: 59 | Admitting: Family Medicine

## 2012-01-21 VITALS — BP 140/76 | HR 90 | Resp 18 | Ht 63.0 in | Wt 256.0 lb

## 2012-01-21 DIAGNOSIS — E119 Type 2 diabetes mellitus without complications: Secondary | ICD-10-CM

## 2012-01-21 DIAGNOSIS — R519 Headache, unspecified: Secondary | ICD-10-CM | POA: Insufficient documentation

## 2012-01-21 DIAGNOSIS — I10 Essential (primary) hypertension: Secondary | ICD-10-CM

## 2012-01-21 DIAGNOSIS — E785 Hyperlipidemia, unspecified: Secondary | ICD-10-CM

## 2012-01-21 DIAGNOSIS — J329 Chronic sinusitis, unspecified: Secondary | ICD-10-CM

## 2012-01-21 DIAGNOSIS — R51 Headache: Secondary | ICD-10-CM

## 2012-01-21 DIAGNOSIS — N3000 Acute cystitis without hematuria: Secondary | ICD-10-CM

## 2012-01-21 DIAGNOSIS — E669 Obesity, unspecified: Secondary | ICD-10-CM

## 2012-01-21 LAB — POCT URINALYSIS DIPSTICK
Bilirubin, UA: NEGATIVE
Glucose, UA: NEGATIVE
Leukocytes, UA: NEGATIVE
Nitrite, UA: NEGATIVE

## 2012-01-21 MED ORDER — BUTALBITAL-APAP-CAFFEINE 50-325-40 MG PO TABS: ORAL_TABLET | ORAL | Status: DC

## 2012-01-21 MED ORDER — CEFTRIAXONE SODIUM 1 G IJ SOLR
500.0000 mg | Freq: Once | INTRAMUSCULAR | Status: AC
  Administered 2012-01-21: 500 mg via INTRAMUSCULAR

## 2012-01-21 MED ORDER — MECLIZINE HCL 12.5 MG PO TABS: 12.5000 mg | ORAL_TABLET | Freq: Three times a day (TID) | ORAL | Status: DC | PRN

## 2012-01-21 MED ORDER — FLUCONAZOLE 150 MG PO TABS: ORAL_TABLET | ORAL | Status: AC

## 2012-01-21 MED ORDER — KETOROLAC TROMETHAMINE 60 MG/2ML IJ SOLN
60.0000 mg | Freq: Once | INTRAMUSCULAR | Status: AC
  Administered 2012-01-21: 60 mg via INTRAMUSCULAR

## 2012-01-21 MED ORDER — ROSUVASTATIN CALCIUM 40 MG PO TABS: 40.0000 mg | ORAL_TABLET | Freq: Every day | ORAL | Status: DC

## 2012-01-21 MED ORDER — CLARITHROMYCIN 500 MG PO TABS: 500.0000 mg | ORAL_TABLET | Freq: Two times a day (BID) | ORAL | Status: DC

## 2012-01-21 MED ORDER — METFORMIN HCL 1000 MG PO TABS: 1000.0000 mg | ORAL_TABLET | Freq: Two times a day (BID) | ORAL | Status: DC

## 2012-01-21 MED ORDER — CLARITHROMYCIN 500 MG PO TABS: 500.0000 mg | ORAL_TABLET | Freq: Two times a day (BID) | ORAL | Status: AC

## 2012-01-21 MED ORDER — FUROSEMIDE 20 MG PO TABS: 20.0000 mg | ORAL_TABLET | Freq: Two times a day (BID) | ORAL | Status: DC

## 2012-01-21 NOTE — Patient Instructions (Addendum)
F/u in 3.5 month  You are being treated for acute sinusitis with mild vertigo and headache. Medications have been sent to local pharmacy per your request.  Toradol 60mg  Im in the office and Rocephin 500mg  iM in the office today   Urine is being checked based on your complaint  Please call back if symptoms do not improve by early next week or if they worsen  HBa1C in 3.5 months before next visit

## 2012-01-21 NOTE — Progress Notes (Signed)
  Subjective:    Patient ID: Tina Wilkins, female    DOB: October 24, 1956, 56 y.o.   MRN: [id number]  HPI 2 week h/o head congestion frontal pressure, chills and sweats for 2 days, tender neck glands. Also intermittent mild vertigo, and posterior headache in neck. C/o malodorous urine and flank pain x 1 week Pt also here for routine f/u of chronic problems. She unfortunately has not been diligent in lifestyle, has gained weight , and her blood sugar and blood pressure are both elevated   Review of Systems See HPI Denies chest congestion, productive cough or wheezing. Denies chest pains, palpitations and leg swelling Denies abdominal pain, nausea, vomiting,diarrhea or constipation.   Denies joint pain, swelling and limitation in mobility. Denies seizures, numbness, or tingling. Denies depression, anxiety or insomnia. Denies skin break down or rash.        Objective:   Physical Exam Patient alert and oriented and in no cardiopulmonary distress.  HEENT: No facial asymmetry, EOMI, frontal  sinus tenderness,  oropharynx pink and moist.  Neck supple anterior cervical  adenopathy.  Chest: Clear to auscultation bilaterally.  CVS: S1, S2 no murmurs, no S3.  ABD: Soft non tender. Bowel sounds normal.  Ext: No edema  MS: Adequate ROM spine, shoulders, hips and knees.  Skin: Intact, no ulcerations or rash noted.  Psych: Good eye contact, normal affect. Memory intact not anxious or depressed appearing.  CNS: CN 2-12 intact, power, tone and sensation normal throughout.        Assessment & Plan:

## 2012-01-22 LAB — LIPID PANEL: HDL: 54 mg/dL (ref >39)

## 2012-01-22 LAB — COMPLETE METABOLIC PANEL WITH GFR
Albumin: 4.6 g/dL (ref 3.5–5.2)
CO2: 27 mEq/L (ref 19–32)
GFR, Est African American: >89 mL/min
GFR, Est Non African American: 89 mL/min
Glucose, Bld: 114 mg/dL — ABNORMAL HIGH (ref 70–99)
Sodium: 143 mEq/L (ref 135–145)
Total Bilirubin: 0.8 mg/dL (ref 0.3–1.2)
Total Protein: 7.2 g/dL (ref 6.0–8.3)

## 2012-01-22 LAB — TSH: TSH: 2.199 u[IU]/mL (ref 0.350–4.500)

## 2012-01-22 LAB — HEMOGLOBIN A1C
Hgb A1c MFr Bld: 7.2 % — ABNORMAL HIGH (ref <5.7)
Mean Plasma Glucose: 160 mg/dL — ABNORMAL HIGH (ref <117)

## 2012-01-23 ENCOUNTER — Telehealth: Payer: Self-pay | Admitting: Family Medicine

## 2012-01-23 NOTE — Telephone Encounter (Signed)
Pt aware of results 

## 2012-02-01 NOTE — Assessment & Plan Note (Signed)
ccua in office, negative for infection

## 2012-02-01 NOTE — Assessment & Plan Note (Signed)
Deteriorated. Patient re-educated about  the importance of commitment to a  minimum of 150 minutes of exercise per week. The importance of healthy food choices with portion control discussed. Encouraged to start a food diary, count calories and to consider  joining a support group. Sample diet sheets offered. Goals set by the patient for the next several months.    

## 2012-02-01 NOTE — Assessment & Plan Note (Signed)
Uncontrolled, closer attention to diet and exercise with weight loss, no med change

## 2012-02-01 NOTE — Assessment & Plan Note (Signed)
Acute infection , antibiotic and meclizine prescribed

## 2012-02-01 NOTE — Assessment & Plan Note (Signed)
Hyperlipidemia:Low fat diet discussed and encouraged.  Controlled, no change in medication   

## 2012-02-01 NOTE — Assessment & Plan Note (Signed)
Sub optimal control, no med change at this time 

## 2012-03-25 ENCOUNTER — Other Ambulatory Visit: Payer: Self-pay | Admitting: Family Medicine

## 2012-04-09 ENCOUNTER — Other Ambulatory Visit: Payer: Self-pay | Admitting: Family Medicine

## 2012-05-19 ENCOUNTER — Other Ambulatory Visit: Payer: Self-pay

## 2012-05-19 ENCOUNTER — Encounter: Payer: Self-pay | Admitting: Family Medicine

## 2012-05-19 ENCOUNTER — Ambulatory Visit (INDEPENDENT_AMBULATORY_CARE_PROVIDER_SITE_OTHER): Payer: 59 | Admitting: Family Medicine

## 2012-05-19 VITALS — BP 140/92 | HR 68 | Resp 16 | Ht 63.0 in | Wt 254.0 lb

## 2012-05-19 DIAGNOSIS — E119 Type 2 diabetes mellitus without complications: Secondary | ICD-10-CM

## 2012-05-19 DIAGNOSIS — E785 Hyperlipidemia, unspecified: Secondary | ICD-10-CM

## 2012-05-19 DIAGNOSIS — I10 Essential (primary) hypertension: Secondary | ICD-10-CM

## 2012-05-19 DIAGNOSIS — E669 Obesity, unspecified: Secondary | ICD-10-CM

## 2012-05-19 MED ORDER — ROSUVASTATIN CALCIUM 40 MG PO TABS: ORAL_TABLET | ORAL | Status: DC

## 2012-05-19 MED ORDER — METFORMIN HCL 1000 MG PO TABS: 1000.0000 mg | ORAL_TABLET | Freq: Two times a day (BID) | ORAL | Status: DC

## 2012-05-19 MED ORDER — TRIAMTERENE-HCTZ 37.5-25 MG PO TABS: 1.0000 | ORAL_TABLET | Freq: Every day | ORAL | Status: DC

## 2012-05-19 MED ORDER — AMLODIPINE BESYLATE 10 MG PO TABS: ORAL_TABLET | ORAL | Status: DC

## 2012-05-19 MED ORDER — BENAZEPRIL HCL 40 MG PO TABS: ORAL_TABLET | ORAL | Status: DC

## 2012-05-19 NOTE — Patient Instructions (Addendum)
cPE in 3.5 month, call if you need me before   New medication for blood pressure, maxzide , one daily, stop the HCTZ  Chem 7 and HBa1C in 3.5 month   No evidence of infection at this time,  It is important that you exercise regularly at least 30 minutes 5 times a week. If you develop chest pain, have severe difficulty breathing, or feel very tired, stop exercising immediately and seek medical attention   A healthy diet is rich in fruit, vegetables and whole grains. Poultry fish, nuts and beans are a healthy choice for protein rather then red meat. A low sodium diet and drinking 64 ounces of water daily is generally recommended. Oils and sweet should be limited. Carbohydrates especially for those who are diabetic or overweight, should be limited to 30-45 gram per meal. It is important to eat on a regular schedule, at least 3 times daily. Snacks should be primarily fruits, vegetables or nuts.

## 2012-05-20 LAB — HEMOGLOBIN A1C: Mean Plasma Glucose: 151 mg/dL — ABNORMAL HIGH (ref <117)

## 2012-05-23 NOTE — Progress Notes (Signed)
  Subjective:    Patient ID: Tina Wilkins, female    DOB: 04-26-1956, 56 y.o.   MRN: [id number]  HPI The PT is here for follow up and re-evaluation of chronic medical conditions, medication management and review of any available recent lab and radiology data.  Preventive health is updated, specifically  Cancer screening and Immunization.   Questions or concerns regarding consultations or procedures which the PT has had in the interim are  addressed. The PT denies any adverse reactions to current medications since the last visit.  There are no new concerns.  1 week h/o increased tenderness in neck glands, no fever, chills, sinus drainage or cough Blood sugars average 130 in the mornings. No consistent commitment to lifestyle change as of yet      Review of Systems See HPI Denies recent fever or chills. Denies sinus pressure, nasal congestion, ear pain or sore throat. Denies chest congestion, productive cough or wheezing. Denies chest pains, palpitations and leg swelling Denies abdominal pain, nausea, vomiting,diarrhea or constipation.   Denies dysuria, frequency, hesitancy or incontinence. Denies joint pain, swelling and limitation in mobility. Denies headaches, seizures, numbness, or tingling. Denies depression, anxiety or insomnia. Denies skin break down or rash.        Objective:   Physical Exam Patient alert and oriented and in no cardiopulmonary distress.  HEENT: No facial asymmetry, EOMI, no sinus tenderness,  oropharynx pink and moist.  Neck supple no adenopathy.  Chest: Clear to auscultation bilaterally.  CVS: S1, S2 no murmurs, no S3.  ABD: Soft non tender. Bowel sounds normal.  Ext: No edema  MS: Adequate ROM spine, shoulders, hips and knees.  Skin: Intact, no ulcerations or rash noted.  Psych: Good eye contact, normal affect. Memory intact not anxious or depressed appearing.  CNS: CN 2-12 intact, power, tone and sensation normal throughout.       Assessment & Plan:

## 2012-05-23 NOTE — Assessment & Plan Note (Signed)
Unchanged. Patient re-educated about  the importance of commitment to a  minimum of 150 minutes of exercise per week. The importance of healthy food choices with portion control discussed. Encouraged to start a food diary, count calories and to consider  joining a support group. Sample diet sheets offered. Goals set by the patient for the next several months.    

## 2012-05-23 NOTE — Assessment & Plan Note (Signed)
Controlled, no change in medication  

## 2012-05-23 NOTE — Assessment & Plan Note (Signed)
Hyperlipidemia:Low fat diet discussed and encouraged.  Controlled, no change in medication   

## 2012-05-23 NOTE — Assessment & Plan Note (Signed)
Uncontrolled, stop HCTZ and start maxzide

## 2012-05-25 ENCOUNTER — Other Ambulatory Visit: Payer: Self-pay | Admitting: Family Medicine

## 2012-07-12 ENCOUNTER — Other Ambulatory Visit: Payer: Self-pay | Admitting: Family Medicine

## 2012-07-20 ENCOUNTER — Other Ambulatory Visit: Payer: Self-pay | Admitting: Family Medicine

## 2012-07-20 DIAGNOSIS — IMO0001 Reserved for inherently not codable concepts without codable children: Secondary | ICD-10-CM

## 2012-07-29 ENCOUNTER — Ambulatory Visit (HOSPITAL_COMMUNITY)
Admission: RE | Admit: 2012-07-29 | Discharge: 2012-07-29 | Disposition: A | Discharge status: Home / Self Care | Payer: 59 | Source: Ambulatory Visit | Attending: Family Medicine | Admitting: Family Medicine

## 2012-07-29 DIAGNOSIS — Z1231 Encounter for screening mammogram for malignant neoplasm of breast: Secondary | ICD-10-CM | POA: Insufficient documentation

## 2012-07-29 DIAGNOSIS — IMO0001 Reserved for inherently not codable concepts without codable children: Secondary | ICD-10-CM

## 2012-08-31 ENCOUNTER — Other Ambulatory Visit: Payer: Self-pay | Admitting: Family Medicine

## 2012-08-31 ENCOUNTER — Telehealth: Payer: Self-pay | Admitting: Family Medicine

## 2012-08-31 LAB — HEMOGLOBIN A1C
Hgb A1c MFr Bld: 6.7 % — ABNORMAL HIGH (ref <5.7)
Mean Plasma Glucose: 146 mg/dL — ABNORMAL HIGH (ref <117)

## 2012-08-31 NOTE — Telephone Encounter (Signed)
Faxed to the lab

## 2012-09-01 ENCOUNTER — Ambulatory Visit (INDEPENDENT_AMBULATORY_CARE_PROVIDER_SITE_OTHER): Payer: 59 | Admitting: Family Medicine

## 2012-09-01 ENCOUNTER — Other Ambulatory Visit (HOSPITAL_COMMUNITY)
Admission: RE | Admit: 2012-09-01 | Discharge: 2012-09-01 | Disposition: A | Discharge status: Home / Self Care | Payer: 59 | Source: Ambulatory Visit | Attending: Family Medicine | Admitting: Family Medicine

## 2012-09-01 ENCOUNTER — Encounter: Payer: Self-pay | Admitting: Family Medicine

## 2012-09-01 VITALS — BP 136/80 | HR 76 | Resp 18 | Ht 63.0 in | Wt 253.1 lb

## 2012-09-01 DIAGNOSIS — Z1151 Encounter for screening for human papillomavirus (HPV): Secondary | ICD-10-CM | POA: Insufficient documentation

## 2012-09-01 DIAGNOSIS — Z1211 Encounter for screening for malignant neoplasm of colon: Secondary | ICD-10-CM

## 2012-09-01 DIAGNOSIS — I10 Essential (primary) hypertension: Secondary | ICD-10-CM

## 2012-09-01 DIAGNOSIS — Z0000 Encounter for general adult medical examination without abnormal findings: Secondary | ICD-10-CM

## 2012-09-01 DIAGNOSIS — Z1329 Encounter for screening for other suspected endocrine disorder: Secondary | ICD-10-CM

## 2012-09-01 DIAGNOSIS — E669 Obesity, unspecified: Secondary | ICD-10-CM

## 2012-09-01 DIAGNOSIS — E119 Type 2 diabetes mellitus without complications: Secondary | ICD-10-CM

## 2012-09-01 DIAGNOSIS — Z01419 Encounter for gynecological examination (general) (routine) without abnormal findings: Secondary | ICD-10-CM | POA: Insufficient documentation

## 2012-09-01 DIAGNOSIS — E785 Hyperlipidemia, unspecified: Secondary | ICD-10-CM

## 2012-09-01 DIAGNOSIS — N3000 Acute cystitis without hematuria: Secondary | ICD-10-CM

## 2012-09-01 LAB — POCT URINALYSIS DIPSTICK
Glucose, UA: NEGATIVE
Ketones, UA: NEGATIVE
Spec Grav, UA: 1.015
Urobilinogen, UA: 0.2

## 2012-09-01 LAB — BASIC METABOLIC PANEL
Potassium: 4 mEq/L (ref 3.5–5.3)
Sodium: 145 mEq/L (ref 135–145)

## 2012-09-01 MED ORDER — LEVOFLOXACIN 500 MG PO TABS: 500.0000 mg | ORAL_TABLET | Freq: Every day | ORAL | Status: AC

## 2012-09-01 NOTE — Patient Instructions (Addendum)
F/u in 4 month  HBA1C in 4 months and chem 7 non fasting, also tsh , cbc, and microalb  Congrats, blood sugar has improved.  Continue to work closely with health coach, and improve  physical activity and eating habits as you are doing  Medication is sent for urinary tract infection  Good that your eye exam is good and up to date for this year  Please check your employer so we can indicate when you had tour tDaP

## 2012-09-01 NOTE — Assessment & Plan Note (Signed)
unchanged. Patient re-educated about  the importance of commitment to a  minimum of 150 minutes of exercise per week. The importance of healthy food choices with portion control discussed.Eating late remains a challenge Encouraged to start a food diary, count calories and to consider  joining a support group. Sample diet sheets offered. Goals set by the patient for the next several months.

## 2012-09-01 NOTE — Assessment & Plan Note (Signed)
Not at goal, but with lifestyle change ongoing this should continue to improve, no med change at this visit, but will reconsider next visit if systolic over 130  DASH diet and commitment to daily physical activity for a minimum of 30 minutes discussed and encouraged, as a part of hypertension management. The importance of attaining a healthy weight is also discussed.

## 2012-09-01 NOTE — Progress Notes (Signed)
  Subjective:    Patient ID: Tina Wilkins, female    DOB: 05/09/1956, 56 y.o.   MRN: [id number]  HPI The PT is here for annual exam and re-evaluation of chronic medical conditions, medication management and review of any available recent lab and radiology data.  Preventive health is updated, specifically  Cancer screening and Immunization.   Questions or concerns regarding consultations or procedures which the PT has had in the interim are  addressed. The PT denies any adverse reactions to current medications since the last visit.  1 week h/o urinary frequency and dysuria, denies fever, flank pain or nausea Now working with health coach on the job, to improve health, still has to reduce calories to realize weight loss blood sugar is improving which is great. Needs to commit to more regular physical activity, has knee pain at times which is limiting to some extent Recent eye exam , dry eye, small cataract, no diabetic or hypertensive changes noted per pt report       Review of Systems See HPI Denies recent fever or chills. Denies sinus pressure, nasal congestion, ear pain or sore throat. Denies chest congestion, productive cough or wheezing. Denies chest pains, palpitations and leg swelling Denies abdominal pain, nausea, vomiting,diarrhea or constipation.    Denies joint pain, swelling and limitation in mobility. Denies headaches, seizures, numbness, or tingling. Denies depression, anxiety or insomnia. Denies skin break down or rash.        Objective:   Physical Exam Pleasant obese female, alert and oriented x 3, in no cardio-pulmonary distress. Afebrile. HEENT No facial trauma or asymetry. Sinuses non tender.  EOMI, PERTL, fundoscopic exam , no hemorhage or exudate.  External ears normal, tympanic membranes clear. Oropharynx moist, no exudate, good dentition. Neck: supple, no adenopathy,JVD or thyromegaly.No bruits.  Chest: Clear to ascultation bilaterally.No crackles  or wheezes. Non tender to palpation  Breast: No asymetry,no masses. No nipple discharge or inversion. No axillary or supraclavicular adenopathy.No dimpling of skin or rash or pigmentation changes  Cardiovascular system; Heart sounds normal,  S1 and  S2 ,no S3.  No murmur, or thrill. Apical beat not displaced Peripheral pulses normal.  Abdomen: Soft, non tender, no organomegaly or masses. No bruits. Bowel sounds normal. No guarding, tenderness or rebound.  Rectal:  No mass. Guaiac negative stool.  GU: External genitalia normal. No lesions. Vaginal canal normal.No discharge. Uterus absent, no adnexal masses, no  adnexal tenderness.  Musculoskeletal exam: Full ROM of spine, hips , shoulders and knees. No deformity ,swelling or crepitus noted. No muscle wasting or atrophy.   Neurologic: Cranial nerves 2 to 12 intact. Power, tone ,sensation and reflexes normal throughout. No disturbance in gait. No tremor.  Skin: Intact, no ulceration, erythema , mild candidiasis under breasts noted. Pigmentation normal throughout  Psych; Normal mood and affect. Judgement and concentration normal  Diabetic Foot Check:  Appearance - no lesions, ulcers or calluses Skin - no unusual pallor or redness, onychomycosis Sensation - grossly intact to light touch Monofilament testing -  Right - Great toe, medial, central, lateral ball and posterior foot intact Left - Great toe, medial, central, lateral ball and posterior foot intact Pulses Left - Dorsalis Pedis and Posterior Tibia normal Right - Dorsalis Pedis and Posterior Tibia normal       Assessment & Plan:

## 2012-09-01 NOTE — Assessment & Plan Note (Signed)
Annual exam performed and documented. Health issues of concern relate to lifestyle and obesity. Pt motivated and getting support to work on this

## 2012-09-01 NOTE — Assessment & Plan Note (Signed)
Symptomatic with abnormal UA  Urine sent for c/s and levaquin prescribed

## 2012-09-01 NOTE — Assessment & Plan Note (Signed)
Improved. Pt congratulated.  No med change  Patient advised to reduce carb and sweets, commit to regular physical activity, take meds as prescribed, test blood as directed, and attempt to lose weight, to improve blood sugar control.

## 2012-09-03 MED ORDER — ROSUVASTATIN CALCIUM 40 MG PO TABS: ORAL_TABLET | ORAL | Status: DC

## 2012-09-03 MED ORDER — FUROSEMIDE 20 MG PO TABS: 20.0000 mg | ORAL_TABLET | Freq: Two times a day (BID) | ORAL | Status: DC

## 2012-09-03 NOTE — Addendum Note (Signed)
Addended by: Kandis Fantasia B on: 09/03/2012 03:44 PM   Modules accepted: Orders, Medications

## 2012-09-04 LAB — LIPID PANEL
Cholesterol: 123 mg/dL (ref 0–200)
Triglycerides: 59 mg/dL (ref <150)
VLDL: 12 mg/dL (ref 0–40)

## 2012-09-04 LAB — HEPATIC FUNCTION PANEL
ALT: 20 U/L (ref 0–35)
Indirect Bilirubin: 0.5 mg/dL (ref 0.0–0.9)
Total Protein: 6.8 g/dL (ref 6.0–8.3)

## 2012-11-29 ENCOUNTER — Other Ambulatory Visit: Payer: Self-pay | Admitting: Family Medicine

## 2012-11-30 ENCOUNTER — Other Ambulatory Visit: Payer: Self-pay | Admitting: Family Medicine

## 2012-12-02 ENCOUNTER — Ambulatory Visit: Payer: 59

## 2013-01-18 ENCOUNTER — Other Ambulatory Visit: Payer: Self-pay | Admitting: Family Medicine

## 2013-01-19 ENCOUNTER — Other Ambulatory Visit: Payer: Self-pay | Admitting: Family Medicine

## 2013-01-19 ENCOUNTER — Telehealth: Payer: Self-pay | Admitting: Family Medicine

## 2013-01-19 NOTE — Telephone Encounter (Signed)
Pls let pt know due to formulary change I have changed her from crestor 40mg  to pravastatin 80mg , then send in script to [facility] pharmacy with note, thanks

## 2013-01-26 ENCOUNTER — Ambulatory Visit (HOSPITAL_COMMUNITY)
Admission: RE | Admit: 2013-01-26 | Discharge: 2013-01-26 | Disposition: A | Discharge status: Home / Self Care | Payer: 59 | Source: Ambulatory Visit | Attending: Family Medicine | Admitting: Family Medicine

## 2013-01-26 ENCOUNTER — Other Ambulatory Visit: Payer: Self-pay | Admitting: Family Medicine

## 2013-01-26 ENCOUNTER — Ambulatory Visit (INDEPENDENT_AMBULATORY_CARE_PROVIDER_SITE_OTHER): Payer: 59 | Admitting: Family Medicine

## 2013-01-26 ENCOUNTER — Encounter: Payer: Self-pay | Admitting: Family Medicine

## 2013-01-26 VITALS — BP 124/90 | HR 74 | Resp 16 | Ht 63.0 in | Wt 252.1 lb

## 2013-01-26 DIAGNOSIS — R05 Cough: Secondary | ICD-10-CM

## 2013-01-26 DIAGNOSIS — E785 Hyperlipidemia, unspecified: Secondary | ICD-10-CM

## 2013-01-26 DIAGNOSIS — M546 Pain in thoracic spine: Secondary | ICD-10-CM

## 2013-01-26 DIAGNOSIS — E119 Type 2 diabetes mellitus without complications: Secondary | ICD-10-CM

## 2013-01-26 DIAGNOSIS — E669 Obesity, unspecified: Secondary | ICD-10-CM

## 2013-01-26 DIAGNOSIS — R079 Chest pain, unspecified: Secondary | ICD-10-CM | POA: Insufficient documentation

## 2013-01-26 DIAGNOSIS — R059 Cough, unspecified: Secondary | ICD-10-CM | POA: Insufficient documentation

## 2013-01-26 DIAGNOSIS — I10 Essential (primary) hypertension: Secondary | ICD-10-CM

## 2013-01-26 DIAGNOSIS — Z23 Encounter for immunization: Secondary | ICD-10-CM

## 2013-01-26 DIAGNOSIS — J329 Chronic sinusitis, unspecified: Secondary | ICD-10-CM

## 2013-01-26 MED ORDER — DOXYCYCLINE HYCLATE 100 MG PO TBEC: 100.0000 mg | DELAYED_RELEASE_TABLET | Freq: Two times a day (BID) | ORAL | Status: DC

## 2013-01-26 MED ORDER — METFORMIN HCL 1000 MG PO TABS: 1000.0000 mg | ORAL_TABLET | Freq: Two times a day (BID) | ORAL | Status: DC

## 2013-01-26 MED ORDER — FUROSEMIDE 20 MG PO TABS: 20.0000 mg | ORAL_TABLET | Freq: Two times a day (BID) | ORAL | Status: DC

## 2013-01-26 MED ORDER — BENAZEPRIL HCL 40 MG PO TABS: ORAL_TABLET | ORAL | Status: DC

## 2013-01-26 MED ORDER — PRAVASTATIN SODIUM 80 MG PO TABS: 80.0000 mg | ORAL_TABLET | Freq: Every evening | ORAL | Status: DC

## 2013-01-26 MED ORDER — BENZONATATE 100 MG PO CAPS: 100.0000 mg | ORAL_CAPSULE | Freq: Three times a day (TID) | ORAL | Status: DC | PRN

## 2013-01-26 MED ORDER — PANTOPRAZOLE SODIUM 40 MG PO TBEC: DELAYED_RELEASE_TABLET | ORAL | Status: DC

## 2013-01-26 NOTE — Telephone Encounter (Signed)
Patient aware.

## 2013-01-26 NOTE — Progress Notes (Signed)
  Subjective:    Patient ID: Tina Wilkins, female    DOB: 1956/07/19, 57 y.o.   MRN: [id number]  HPI The PT is here for follow up and re-evaluation of chronic medical conditions, medication management and review of any available recent lab and radiology data.  Preventive health is updated, specifically  Cancer screening and Immunization.   Questions or concerns regarding consultations or procedures which the PT has had in the interim are  addressed. The PT denies any adverse reactions to current medications since the last visit.  Frontal and maxillary sinus pressure and tender right neck glands since past 3 weeks, drainage at times is yellow, little cough, at times sputum is yellow, sweat  at night  From 3 to 4 am, x 6 month,wants to try effexor C/O left lower chest pain, esp with cough, also cannot lie and put pressure on that side comfortably In creased work stress, long hours, not comited to exercise program as discussed but will work on this Denies polyuria, polydipsia or blurred vision     Review of Systems See HPI  Denies chest pains, palpitations and leg swelling Denies abdominal pain, nausea, vomiting,diarrhea or constipation.   Denies dysuria, frequency, hesitancy or incontinence.  Denies headaches, seizures, numbness, or tingling. Denies depression, anxiety or insomnia. Denies skin break down or rash.        Objective:   Physical Exam  Patient alert and oriented and in no cardiopulmonary distress.  HEENT: No facial asymmetry, EOMI,maxillary sinus tenderness,  oropharynx pink and moist.  Neck supple anterior cervical adenopathy.no erythema or edema of nasal mucosa  Chest: Clear to auscultation bilaterally.  CVS: S1, S2 no murmurs, no S3.  ABD: Soft non tender. Bowel sounds normal.  Ext: No edema  MS: Adequate though reduced  ROM spine, shoulders, hips and knees.  Skin: Intact, no ulcerations or rash noted.  Psych: Good eye contact, normal affect. Memory  intact not anxious or depressed appearing.  CNS: CN 2-12 intact, power, tone and sensation normal throughout.       Assessment & Plan:

## 2013-01-26 NOTE — Patient Instructions (Addendum)
F/u in 4 month   It is important that you exercise regularly at least 30 minutes 5 times a week. If you develop chest pain, have severe difficulty breathing, or feel very tired, stop exercising immediately and seek medical attention    A healthy diet is rich in fruit, vegetables and whole grains. Poultry fish, nuts and beans are a healthy choice for protein rather then red meat. A low sodium diet and drinking 64 ounces of water daily is generally recommended. Oils and sweet should be limited. Carbohydrates especially for those who are diabetic or overweight, should be limited to 30-45 gram per meal. It is important to eat on a regular schedule, at least 3 times daily. Snacks should be primarily fruits, vegetables or nuts.  Medication is sent in for bronchitis, a CXR is ordered  You are referred for back pain  Fasting lipid, cmp and EGFR, hBA1C in 4 month

## 2013-01-27 LAB — BASIC METABOLIC PANEL
BUN: 13 mg/dL (ref 6–23)
CO2: 30 mEq/L (ref 19–32)
Chloride: 102 mEq/L (ref 96–112)
Glucose, Bld: 107 mg/dL — ABNORMAL HIGH (ref 70–99)
Potassium: 4 mEq/L (ref 3.5–5.3)
Sodium: 137 mEq/L (ref 135–145)

## 2013-01-27 LAB — CBC
HCT: 33.4 % — ABNORMAL LOW (ref 36.0–46.0)
Hemoglobin: 10.9 g/dL — ABNORMAL LOW (ref 12.0–15.0)
MCHC: 32.6 g/dL (ref 30.0–36.0)
RBC: 4.06 MIL/uL (ref 3.87–5.11)
WBC: 8.2 10*3/uL (ref 4.0–10.5)

## 2013-01-27 LAB — MICROALBUMIN / CREATININE URINE RATIO
Creatinine, Urine: 143.5 mg/dL
Microalb Creat Ratio: 7.6 mg/g (ref 0.0–30.0)
Microalb, Ur: 1.09 mg/dL (ref 0.00–1.89)

## 2013-01-27 LAB — TSH: TSH: 2.373 u[IU]/mL (ref 0.350–4.500)

## 2013-01-27 LAB — FERRITIN: Ferritin: 53 ng/mL (ref 10–291)

## 2013-01-27 NOTE — Assessment & Plan Note (Signed)
Hyperlipidemia:Low fat diet discussed and encouraged.  Updated lab next visit 

## 2013-01-27 NOTE — Assessment & Plan Note (Signed)
Acute infection, antibiotics, and decongestants prescribed

## 2013-01-27 NOTE — Assessment & Plan Note (Signed)
Not at goal, no med change, DASH diet and commitment to daily physical activity for a minimum of 30 minutes discussed and encouraged, as a part of hypertension management. The importance of attaining a healthy weight is also discussed.

## 2013-01-27 NOTE — Assessment & Plan Note (Signed)
Unchanged. Patient re-educated about  the importance of commitment to a  minimum of 150 minutes of exercise per week. The importance of healthy food choices with portion control discussed. Encouraged to start a food diary, count calories and to consider  joining a support group. Sample diet sheets offered. Goals set by the patient for the next several months.    

## 2013-01-27 NOTE — Assessment & Plan Note (Signed)
Less well but adequately controlled. Pt to work on weight loss and regular physical activity

## 2013-01-27 NOTE — Addendum Note (Signed)
Addended by: Abner Greenspan on: 01/27/2013 09:47 AM   Modules accepted: Orders

## 2013-01-27 NOTE — Assessment & Plan Note (Signed)
Cough with chest pain, CXR to be obtained, I believe this is musculoskeletal

## 2013-01-27 NOTE — Assessment & Plan Note (Signed)
Chronic back pain , refer to PT

## 2013-01-28 ENCOUNTER — Telehealth: Payer: Self-pay | Admitting: Family Medicine

## 2013-01-28 NOTE — Telephone Encounter (Signed)
Doxycycline was sent in

## 2013-01-28 NOTE — Telephone Encounter (Signed)
Pt aware.

## 2013-02-02 ENCOUNTER — Inpatient Hospital Stay (HOSPITAL_COMMUNITY): Admission: RE | Admit: 2013-02-02 | Payer: 59 | Source: Ambulatory Visit | Admitting: Physical Therapy

## 2013-03-02 ENCOUNTER — Ambulatory Visit (INDEPENDENT_AMBULATORY_CARE_PROVIDER_SITE_OTHER): Payer: Self-pay | Admitting: Family Medicine

## 2013-03-02 DIAGNOSIS — E119 Type 2 diabetes mellitus without complications: Secondary | ICD-10-CM

## 2013-03-02 NOTE — Progress Notes (Signed)
Patient presents today for DM follow-up as part of employer-sponsored Link to [employer]. Medications, glucose readings, a1c and compliance have been reviewed. I have also discussed with patient lifestyle interventions such as diet and exercise. Details of the visit can be found in [employer] documenting program through [facility] [employer] [facility]). Patient has set a series of personal goals and will follow-up in no more than 3 months for further review of DM.

## 2013-03-16 NOTE — Progress Notes (Signed)
Patient ID: Tina Wilkins, female   DOB: Apr 06, 1956, 57 y.o.   MRN: [id number] ATTENDING PHYSICIAN NOTE: I have reviewed the chart and agree with the plan as detailed above. Denny Levy MD Pager [phone]

## 2013-05-10 LAB — LIPID PANEL: Total CHOL/HDL Ratio: 2.6 Ratio

## 2013-05-11 ENCOUNTER — Ambulatory Visit (INDEPENDENT_AMBULATORY_CARE_PROVIDER_SITE_OTHER): Payer: 59 | Admitting: Family Medicine

## 2013-05-11 ENCOUNTER — Encounter: Payer: Self-pay | Admitting: Family Medicine

## 2013-05-11 VITALS — BP 150/98 | HR 83 | Resp 16 | Ht 63.0 in | Wt 249.0 lb

## 2013-05-11 DIAGNOSIS — R059 Cough, unspecified: Secondary | ICD-10-CM

## 2013-05-11 DIAGNOSIS — E119 Type 2 diabetes mellitus without complications: Secondary | ICD-10-CM

## 2013-05-11 DIAGNOSIS — E669 Obesity, unspecified: Secondary | ICD-10-CM

## 2013-05-11 DIAGNOSIS — E785 Hyperlipidemia, unspecified: Secondary | ICD-10-CM

## 2013-05-11 DIAGNOSIS — J329 Chronic sinusitis, unspecified: Secondary | ICD-10-CM

## 2013-05-11 DIAGNOSIS — I10 Essential (primary) hypertension: Secondary | ICD-10-CM

## 2013-05-11 DIAGNOSIS — R05 Cough: Secondary | ICD-10-CM

## 2013-05-11 LAB — COMPLETE METABOLIC PANEL WITH GFR
AST: 18 U/L (ref 0–37)
Albumin: 4.4 g/dL (ref 3.5–5.2)
Alkaline Phosphatase: 88 U/L (ref 39–117)
Calcium: 9.9 mg/dL (ref 8.4–10.5)
Chloride: 107 mEq/L (ref 96–112)
GFR, Est Non African American: 68 mL/min
Glucose, Bld: 100 mg/dL — ABNORMAL HIGH (ref 70–99)
Potassium: 4 mEq/L (ref 3.5–5.3)
Sodium: 142 mEq/L (ref 135–145)
Total Protein: 7 g/dL (ref 6.0–8.3)

## 2013-05-11 MED ORDER — METFORMIN HCL 1000 MG PO TABS: 1000.0000 mg | ORAL_TABLET | Freq: Two times a day (BID) | ORAL | Status: DC

## 2013-05-11 MED ORDER — BENZONATATE 100 MG PO CAPS: 100.0000 mg | ORAL_CAPSULE | Freq: Four times a day (QID) | ORAL | Status: DC | PRN

## 2013-05-11 MED ORDER — PANTOPRAZOLE SODIUM 40 MG PO TBEC: DELAYED_RELEASE_TABLET | ORAL | Status: DC

## 2013-05-11 MED ORDER — TRIAMTERENE-HCTZ 37.5-25 MG PO TABS: 1.0000 | ORAL_TABLET | Freq: Every day | ORAL | Status: DC

## 2013-05-11 MED ORDER — CLONIDINE HCL 0.3 MG PO TABS: ORAL_TABLET | ORAL | Status: DC

## 2013-05-11 MED ORDER — PENICILLIN V POTASSIUM 500 MG PO TABS: 500.0000 mg | ORAL_TABLET | Freq: Three times a day (TID) | ORAL | Status: DC

## 2013-05-11 MED ORDER — FUROSEMIDE 20 MG PO TABS: 20.0000 mg | ORAL_TABLET | Freq: Two times a day (BID) | ORAL | Status: DC

## 2013-05-11 NOTE — Patient Instructions (Addendum)
F/u in 4 month, call if you ned me before  Antibiotic sent in for sinusitis ans tessalon perles for cough. If left posterior chest pain persists , call for xray  Congrats on excellent l;abs, inmproved blood sugar and you will work more diligently on  weight loss.  I  will check more into the pill requested, and send this in or another one to help with weight loss we will let you know  HBA1C in 4 month, and chem 7  Call between visits when well for pneumonia vaccine, you need this

## 2013-05-11 NOTE — Progress Notes (Signed)
  Subjective:    Patient ID: Tina Wilkins, female    DOB: 1956-07-19, 57 y.o.   MRN: [id number]  HPI The PT is here for follow up and re-evaluation of chronic medical conditions, medication management and review of any available recent lab and radiology data.  Preventive health is updated, specifically  Cancer screening and Immunization.   Questions or concerns regarding consultations or procedures which the PT has had in the interim are  addressed. The PT denies any adverse reactions to current medications since the last visit.  C/o increased facial pressure  Wit post nasal drainage, chills and non productive dep cough for the apst 5 days  To 1 week, does not feel well. Interested in specific drug to help with weight loss as little success to date, though has comited to more exercise  Blood suagrs are excellent when tested      Review of Systems See HPI Denies chest pains, palpitations and leg swelling Denies abdominal pain, nausea, vomiting,diarrhea or constipation.   Denies dysuria, frequency, hesitancy or incontinence. Denies joint pain, swelling and limitation in mobility. Denies headaches, seizures, numbness, or tingling. Denies depression, anxiety or insomnia. Denies skin break down or rash.        Objective:   Physical Exam Patient alert and oriented and in no cardiopulmonary distress.Mildly bill appearing  HEENT: No facial asymmetry, EOMI, maxillary and ethmoid  sinus tenderness,  oropharynx pink and moist.  Neck supple anterior  Adenopathy.TM clear  Chest: adequare air entry, few basilar crackles  CVS: S1, S2 no murmurs, no S3.  ABD: Soft non tender. Bowel sounds normal.  Ext: No edema  MS: Adequate ROM spine, shoulders, hips and knees.  Skin: Intact, no ulcerations or rash noted.  Psych: Good eye contact, normal affect. Memory intact not anxious or depressed appearing.  CNS: CN 2-12 intact, power, tone and sensation normal throughout.         Assessment & Plan:

## 2013-05-22 ENCOUNTER — Telehealth: Payer: Self-pay | Admitting: Family Medicine

## 2013-05-22 NOTE — Assessment & Plan Note (Signed)
Increased and uncontrolled with little sputum, decongestant prescribed

## 2013-05-22 NOTE — Assessment & Plan Note (Signed)
Antibiotic prescribed based on symptoms

## 2013-05-22 NOTE — Telephone Encounter (Signed)
Thjs is the CORRECT pt who requested belviq, the drug to help with appetite suppression, it is an SNRI ( like an antideprssant) it is oK for her to use this , i suggest she does , hav entered it historically, pls send in after you speak with her

## 2013-05-22 NOTE — Assessment & Plan Note (Signed)
Improved Patient advised to reduce carb and sweets, commit to regular physical activity, take meds as prescribed, test blood as directed, and attempt to lose weight, to improve blood sugar control.  

## 2013-05-22 NOTE — Assessment & Plan Note (Signed)
Hyperlipidemia:Low fat diet discussed and encouraged.  Controlled, no change in medication   

## 2013-05-22 NOTE — Assessment & Plan Note (Signed)
unchanged Patient re-educated about  the importance of commitment to a  minimum of 150 minutes of exercise per week. The importance of healthy food choices with portion control discussed. Encouraged to start a food diary, count calories and to consider  joining a support group. Sample diet sheets offered. Goals set by the patient for the next several months.    

## 2013-05-22 NOTE — Assessment & Plan Note (Signed)
Controlled, no change in medication DASH diet and commitment to daily physical activity for a minimum of 30 minutes discussed and encouraged, as a part of hypertension management. The importance of attaining a healthy weight is also discussed.  

## 2013-05-24 NOTE — Telephone Encounter (Signed)
Called and left message for patient to return call.  

## 2013-06-09 ENCOUNTER — Other Ambulatory Visit: Payer: Self-pay

## 2013-06-13 ENCOUNTER — Other Ambulatory Visit: Payer: Self-pay | Admitting: Family Medicine

## 2013-06-13 DIAGNOSIS — Z139 Encounter for screening, unspecified: Secondary | ICD-10-CM

## 2013-06-24 ENCOUNTER — Ambulatory Visit (INDEPENDENT_AMBULATORY_CARE_PROVIDER_SITE_OTHER): Payer: Self-pay | Admitting: Family Medicine

## 2013-06-24 DIAGNOSIS — E119 Type 2 diabetes mellitus without complications: Secondary | ICD-10-CM

## 2013-06-24 NOTE — Progress Notes (Signed)
Patient presents today for DM follow-up as part of employer-sponsored Link to [employer] Program. Medications, glucose readings, a1c and compliance have been reviewed. I have also discussed with patient lifestyle interventions including diet and exercise. Details of the visit can be found in [employer] documenting program through [facility] Patient has set a series of personal goals and will follow-up in no more than 3 months for further review of DM. 

## 2013-07-05 NOTE — Progress Notes (Signed)
Patient ID: Tina Wilkins, female   DOB: 1956/04/14, 57 y.o.   MRN: [id number] ATTENDING PHYSICIAN NOTE: I have reviewed the chart and agree with the plan as detailed above. Denny Levy MD Pager [phone]

## 2013-07-10 NOTE — Telephone Encounter (Signed)
Letter sent for patient to contact office

## 2013-08-08 ENCOUNTER — Ambulatory Visit (HOSPITAL_COMMUNITY)
Admission: RE | Admit: 2013-08-08 | Discharge: 2013-08-08 | Disposition: A | Discharge status: Home / Self Care | Payer: 59 | Source: Ambulatory Visit | Attending: Family Medicine | Admitting: Family Medicine

## 2013-08-08 DIAGNOSIS — Z139 Encounter for screening, unspecified: Secondary | ICD-10-CM

## 2013-08-08 DIAGNOSIS — Z1231 Encounter for screening mammogram for malignant neoplasm of breast: Secondary | ICD-10-CM | POA: Insufficient documentation

## 2013-08-18 ENCOUNTER — Telehealth: Payer: Self-pay | Admitting: Family Medicine

## 2013-08-19 ENCOUNTER — Telehealth: Payer: Self-pay | Admitting: Family Medicine

## 2013-08-19 NOTE — Telephone Encounter (Signed)
I don't see any messages for her?

## 2013-08-19 NOTE — Telephone Encounter (Signed)
Spoke directly with pt , no call made from me or this office , i have reviewed her record

## 2013-08-25 NOTE — Telephone Encounter (Signed)
No note of call made

## 2013-09-12 ENCOUNTER — Ambulatory Visit: Payer: 59 | Admitting: Family Medicine

## 2013-09-14 ENCOUNTER — Ambulatory Visit: Payer: 59 | Admitting: Family Medicine

## 2013-09-19 ENCOUNTER — Other Ambulatory Visit: Payer: Self-pay | Admitting: Family Medicine

## 2013-09-19 LAB — BASIC METABOLIC PANEL
BUN: 11 mg/dL (ref 6–23)
Chloride: 104 mEq/L (ref 96–112)
Creat: 0.97 mg/dL (ref 0.50–1.10)
Glucose, Bld: 104 mg/dL — ABNORMAL HIGH (ref 70–99)
Potassium: 3.8 mEq/L (ref 3.5–5.3)

## 2013-09-22 ENCOUNTER — Ambulatory Visit (INDEPENDENT_AMBULATORY_CARE_PROVIDER_SITE_OTHER): Payer: 59 | Admitting: Family Medicine

## 2013-09-22 ENCOUNTER — Encounter (INDEPENDENT_AMBULATORY_CARE_PROVIDER_SITE_OTHER): Payer: Self-pay

## 2013-09-22 ENCOUNTER — Encounter: Payer: Self-pay | Admitting: Family Medicine

## 2013-09-22 VITALS — BP 122/84 | HR 71 | Resp 16 | Ht 63.0 in | Wt 248.1 lb

## 2013-09-22 DIAGNOSIS — I10 Essential (primary) hypertension: Secondary | ICD-10-CM

## 2013-09-22 DIAGNOSIS — K219 Gastro-esophageal reflux disease without esophagitis: Secondary | ICD-10-CM

## 2013-09-22 DIAGNOSIS — E669 Obesity, unspecified: Secondary | ICD-10-CM

## 2013-09-22 DIAGNOSIS — Z139 Encounter for screening, unspecified: Secondary | ICD-10-CM

## 2013-09-22 DIAGNOSIS — Z23 Encounter for immunization: Secondary | ICD-10-CM

## 2013-09-22 DIAGNOSIS — E785 Hyperlipidemia, unspecified: Secondary | ICD-10-CM

## 2013-09-22 DIAGNOSIS — E119 Type 2 diabetes mellitus without complications: Secondary | ICD-10-CM

## 2013-09-22 MED ORDER — METFORMIN HCL 1000 MG PO TABS: 1000.0000 mg | ORAL_TABLET | Freq: Two times a day (BID) | ORAL | Status: DC

## 2013-09-22 MED ORDER — BENAZEPRIL HCL 40 MG PO TABS: ORAL_TABLET | ORAL | Status: DC

## 2013-09-22 MED ORDER — CANAGLIFLOZIN 300 MG PO TABS: 1.0000 | ORAL_TABLET | Freq: Every day | ORAL | Status: DC

## 2013-09-22 NOTE — Progress Notes (Signed)
  Subjective:    Patient ID: Tina Wilkins, female    DOB: 30-Sep-1956, 57 y.o.   MRN: [id number]  HPI The PT is here for follow up and re-evaluation of chronic medical conditions, medication management and review of any available recent lab and radiology data.  Preventive health is updated, specifically  Cancer screening and Immunization.   Questions or concerns regarding consultations or procedures which the PT has had in the interim are  addressed. The PT denies any adverse reactions to current medications since the last visit.  There are no new concerns.  There are no specific complaints       Review of Systems See HPI Denies recent fever or chills. Denies sinus pressure, nasal congestion, ear pain or sore throat. Denies chest congestion, productive cough or wheezing. Denies chest pains, palpitations and leg swelling Denies abdominal pain, nausea, vomiting,diarrhea or constipation.   Denies dysuria, frequency, hesitancy or incontinence. Denies joint pain, swelling and limitation in mobility. Denies headaches, seizures, numbness, or tingling. Denies depression, anxiety or insomnia. Denies skin break down or rash.        Objective:   Physical Exam  Patient alert and oriented and in no cardiopulmonary distress.  HEENT: No facial asymmetry, EOMI, no sinus tenderness,  oropharynx pink and moist.  Neck supple no adenopathy.  Chest: Clear to auscultation bilaterally.  CVS: S1, S2 no murmurs, no S3.  ABD: Soft non tender. Bowel sounds normal.  Ext: No edema  MS: Adequate ROM spine, shoulders, hips and knees.  Skin: Intact, no ulcerations or rash noted.  Psych: Good eye contact, normal affect. Memory intact not anxious or depressed appearing.  CNS: CN 2-12 intact, power, tone and sensation normal throughout.       Assessment & Plan:

## 2013-09-22 NOTE — Patient Instructions (Addendum)
Pelvic and breast first week in March, call if you need me before  Pneumonia vaccine today  New additional med for diabetes. Invokana 300mg  one daily  We will provide 1 bottle of 100mg  tablets, take one daily, till done, also a second bottle of 300mg  tablets take one daily till done. If no problems pls fill script which is given to you with coupon which will provide you with 1 year free  It is important that you exercise regularly at least 30 minutes 5 times a week. If you develop chest pain, have severe difficulty breathing, or feel very tired, stop exercising immediately and seek medical attention  Chem 7 and EGFr non fast end November  Fasting Lipid, cmp and EGFr, HBA1C, TSH, vit D, CBC, microalb Feb 27 or after

## 2013-10-12 ENCOUNTER — Ambulatory Visit (INDEPENDENT_AMBULATORY_CARE_PROVIDER_SITE_OTHER): Payer: 59 | Admitting: Family Medicine

## 2013-10-12 VITALS — BP 153/104 | HR 82 | Ht 63.0 in | Wt 246.8 lb

## 2013-10-12 DIAGNOSIS — E119 Type 2 diabetes mellitus without complications: Secondary | ICD-10-CM

## 2013-10-12 NOTE — Progress Notes (Signed)
Patient presents today for DM follow-up as part of employer-sponsored Link to [employer] Program. Medications, glucose readings, a1c and compliance have been reviewed. I have also discussed with patient lifestyle interventions including diet and exercise. Details of the visit can be found in [employer] documenting program through [facility] Patient has set a series of personal goals and will follow-up in no more than 3 months for further review of DM. 

## 2013-10-31 ENCOUNTER — Other Ambulatory Visit: Payer: Self-pay | Admitting: Family Medicine

## 2013-11-08 LAB — COMPLETE METABOLIC PANEL WITH GFR
BUN: 17 mg/dL (ref 6–23)
CO2: 29 mEq/L (ref 19–32)
Calcium: 9.9 mg/dL (ref 8.4–10.5)
Chloride: 103 mEq/L (ref 96–112)
Creat: 1.01 mg/dL (ref 0.50–1.10)
GFR, Est African American: 70 mL/min
GFR, Est Non African American: 61 mL/min
Glucose, Bld: 99 mg/dL (ref 70–99)

## 2013-11-20 DIAGNOSIS — K219 Gastro-esophageal reflux disease without esophagitis: Secondary | ICD-10-CM | POA: Insufficient documentation

## 2013-11-20 NOTE — Assessment & Plan Note (Signed)
deteriorated though adequate control. Add invokana daily Patient advised to reduce carb and sweets, commit to regular physical activity, take meds as prescribed, test blood as directed, and attempt to lose weight, to improve blood sugar control.

## 2013-11-20 NOTE — Assessment & Plan Note (Signed)
Controlled, no change in medication Hyperlipidemia:Low fat diet discussed and encouraged.  \ 

## 2013-11-20 NOTE — Assessment & Plan Note (Signed)
Unchanged. Patient re-educated about  the importance of commitment to a  minimum of 150 minutes of exercise per week. The importance of healthy food choices with portion control discussed. Encouraged to start a food diary, count calories and to consider  joining a support group. Sample diet sheets offered. Goals set by the patient for the next several months.    

## 2013-11-20 NOTE — Assessment & Plan Note (Signed)
Controlled, no change in medication  

## 2013-11-20 NOTE — Assessment & Plan Note (Signed)
Controlled, no change in medication DASH diet and commitment to daily physical activity for a minimum of 30 minutes discussed and encouraged, as a part of hypertension management. The importance of attaining a healthy weight is also discussed.  

## 2013-11-22 NOTE — Progress Notes (Signed)
Patient ID: Tina Wilkins, female   DOB: 07/24/1956, 57 y.o.   MRN: [id number] ATTENDING PHYSICIAN NOTE: I have reviewed the chart and agree with the plan as detailed above. Sara Neal MD Pager [phone]  

## 2014-02-01 LAB — CBC WITH DIFFERENTIAL/PLATELET
Basophils Absolute: 0 10*3/uL (ref 0.0–0.1)
Basophils Relative: 1 % (ref 0–1)
Eosinophils Absolute: 0.3 10*3/uL (ref 0.0–0.7)
Eosinophils Relative: 4 % (ref 0–5)
HCT: 36.6 % (ref 36.0–46.0)
Hemoglobin: 11.9 g/dL — ABNORMAL LOW (ref 12.0–15.0)
Lymphocytes Relative: 40 % (ref 12–46)
Lymphs Abs: 2.9 10*3/uL (ref 0.7–4.0)
MCH: 27.2 pg (ref 26.0–34.0)
MCHC: 32.5 g/dL (ref 30.0–36.0)
MCV: 83.6 fL (ref 78.0–100.0)
Monocytes Absolute: 0.3 10*3/uL (ref 0.1–1.0)
Monocytes Relative: 4 % (ref 3–12)
Neutro Abs: 3.8 10*3/uL (ref 1.7–7.7)
Neutrophils Relative %: 51 % (ref 43–77)
Platelets: 466 10*3/uL — ABNORMAL HIGH (ref 150–400)
RBC: 4.38 MIL/uL (ref 3.87–5.11)
RDW: 15.6 % — ABNORMAL HIGH (ref 11.5–15.5)
WBC: 7.4 10*3/uL (ref 4.0–10.5)

## 2014-02-02 ENCOUNTER — Encounter (INDEPENDENT_AMBULATORY_CARE_PROVIDER_SITE_OTHER): Payer: Self-pay

## 2014-02-02 ENCOUNTER — Ambulatory Visit (INDEPENDENT_AMBULATORY_CARE_PROVIDER_SITE_OTHER): Payer: 59 | Admitting: Family Medicine

## 2014-02-02 ENCOUNTER — Encounter: Payer: Self-pay | Admitting: Family Medicine

## 2014-02-02 ENCOUNTER — Other Ambulatory Visit (HOSPITAL_COMMUNITY)
Admission: RE | Admit: 2014-02-02 | Discharge: 2014-02-02 | Disposition: A | Discharge status: Home / Self Care | Payer: 59 | Source: Ambulatory Visit | Attending: Family Medicine | Admitting: Family Medicine

## 2014-02-02 VITALS — BP 124/86 | HR 80 | Resp 16 | Ht 63.0 in | Wt 246.0 lb

## 2014-02-02 DIAGNOSIS — Z124 Encounter for screening for malignant neoplasm of cervix: Secondary | ICD-10-CM

## 2014-02-02 DIAGNOSIS — I10 Essential (primary) hypertension: Secondary | ICD-10-CM

## 2014-02-02 DIAGNOSIS — E559 Vitamin D deficiency, unspecified: Secondary | ICD-10-CM | POA: Insufficient documentation

## 2014-02-02 DIAGNOSIS — Z0000 Encounter for general adult medical examination without abnormal findings: Secondary | ICD-10-CM

## 2014-02-02 DIAGNOSIS — E785 Hyperlipidemia, unspecified: Secondary | ICD-10-CM

## 2014-02-02 DIAGNOSIS — Z01419 Encounter for gynecological examination (general) (routine) without abnormal findings: Secondary | ICD-10-CM | POA: Insufficient documentation

## 2014-02-02 DIAGNOSIS — E119 Type 2 diabetes mellitus without complications: Secondary | ICD-10-CM

## 2014-02-02 DIAGNOSIS — Z1211 Encounter for screening for malignant neoplasm of colon: Secondary | ICD-10-CM

## 2014-02-02 LAB — COMPLETE METABOLIC PANEL WITH GFR
ALT: 30 U/L (ref 0–35)
AST: 21 U/L (ref 0–37)
Albumin: 4 g/dL (ref 3.5–5.2)
Alkaline Phosphatase: 95 U/L (ref 39–117)
BUN: 10 mg/dL (ref 6–23)
CO2: 27 mEq/L (ref 19–32)
Calcium: 9.9 mg/dL (ref 8.4–10.5)
Chloride: 105 mEq/L (ref 96–112)
Creat: 0.92 mg/dL (ref 0.50–1.10)
GFR, Est African American: 79 mL/min
GFR, Est Non African American: 68 mL/min
Glucose, Bld: 98 mg/dL (ref 70–99)
Potassium: 4.1 mEq/L (ref 3.5–5.3)
Sodium: 140 mEq/L (ref 135–145)
Total Bilirubin: 0.5 mg/dL (ref 0.2–1.2)
Total Protein: 7.2 g/dL (ref 6.0–8.3)

## 2014-02-02 LAB — HEMOGLOBIN A1C
Hgb A1c MFr Bld: 6.4 % — ABNORMAL HIGH (ref <5.7)
Mean Plasma Glucose: 137 mg/dL — ABNORMAL HIGH (ref <117)

## 2014-02-02 LAB — LIPID PANEL
Cholesterol: 198 mg/dL (ref 0–200)
HDL: 60 mg/dL (ref >39)
LDL Cholesterol: 117 mg/dL — ABNORMAL HIGH (ref 0–99)
Total CHOL/HDL Ratio: 3.3 Ratio
Triglycerides: 106 mg/dL (ref <150)
VLDL: 21 mg/dL (ref 0–40)

## 2014-02-02 LAB — MICROALBUMIN / CREATININE URINE RATIO
Creatinine, Urine: 108.9 mg/dL
Microalb Creat Ratio: 4.6 mg/g (ref 0.0–30.0)
Microalb, Ur: 0.5 mg/dL (ref 0.00–1.89)

## 2014-02-02 LAB — POC HEMOCCULT BLD/STL (OFFICE/1-CARD/DIAGNOSTIC): FECAL OCCULT BLD: NEGATIVE

## 2014-02-02 LAB — VITAMIN D 25 HYDROXY (VIT D DEFICIENCY, FRACTURES): Vit D, 25-Hydroxy: 22 ng/mL — ABNORMAL LOW (ref 30–89)

## 2014-02-02 LAB — TSH: TSH: 2.202 u[IU]/mL (ref 0.350–4.500)

## 2014-02-02 MED ORDER — BENAZEPRIL HCL 40 MG PO TABS: ORAL_TABLET | ORAL | Status: DC

## 2014-02-02 MED ORDER — CLONIDINE HCL 0.3 MG PO TABS: ORAL_TABLET | ORAL | Status: DC

## 2014-02-02 MED ORDER — METFORMIN HCL 1000 MG PO TABS: 1000.0000 mg | ORAL_TABLET | Freq: Two times a day (BID) | ORAL | Status: DC

## 2014-02-02 MED ORDER — ROSUVASTATIN CALCIUM 20 MG PO TABS: 20.0000 mg | ORAL_TABLET | Freq: Every day | ORAL | Status: DC

## 2014-02-02 MED ORDER — TRIAMTERENE-HCTZ 37.5-25 MG PO TABS: 1.0000 | ORAL_TABLET | Freq: Every day | ORAL | Status: DC

## 2014-02-02 MED ORDER — ERGOCALCIFEROL 1.25 MG (50000 UT) PO CAPS: 50000.0000 [IU] | ORAL_CAPSULE | ORAL | Status: DC

## 2014-02-02 NOTE — Patient Instructions (Signed)
F/U in 4 months, call if you need me before  It is important that you exercise regularly at least 30 minutes 5 times a week. If you develop chest pain, have severe difficulty breathing, or feel very tired, stop exercising immediately and seek medical attention    A healthy diet is rich in fruit, vegetables and whole grains. Poultry fish, nuts and beans are a healthy choice for protein rather then red meat. A low sodium diet and drinking 64 ounces of water daily is generally recommended. Oils and sweet should be limited. Carbohydrates especially for those who are diabetic or overweight, should be limited to 45 to 60 gram per meal. It is important to eat on a regular schedule, at least 3 times daily. Snacks should be primarily fruits, vegetables or nuts.  Adequate rest, generally 6 to 8 hours per night is important for good health.Good sleep hygiene involves setting a regular bedtime, and turning off all sound and light in your sleep environment.Limiting caffeine intake will also help with the ability to rest well  Lab work needs to be done 3 to 5 days before your follow up visit please.Fasting lipid, cmp and EGFr, HBA1C New for cholesterol is crestor 80m , cholesterol is high, cut back on fried andf fatty fooods , cheese, red meat , butter , egg yolk and check for coupon for crestor  Excellent blood sugar  Continue healthy habits  Call if you decide that you want  SCAN OF THE PELVIS  All medications need to be brought to every visit

## 2014-02-05 DIAGNOSIS — Z0000 Encounter for general adult medical examination without abnormal findings: Secondary | ICD-10-CM | POA: Insufficient documentation

## 2014-02-05 NOTE — Progress Notes (Signed)
   Subjective:    Patient ID: Tina Wilkins, female    DOB: 10/19/1956, 58 y.o.   MRN: [id number]  HPI The patient is for an annual exam. Health maintenance is reviewed and updated, specifically  recommended,  screening tests and immunizations. Recent lab and radiologic data is reviewed also.    Review of Systems See HPI Denies recent fever or chills. Denies sinus pressure, nasal congestion, ear pain or sore throat. Denies chest congestion, productive cough or wheezing. Denies chest pains, palpitations and leg swelling Denies abdominal pain, nausea, vomiting,diarrhea or constipation.   Denies dysuria, frequency, hesitancy or incontinence. Denies joint pain, swelling and limitation in mobility. Denies headaches, seizures, numbness, or tingling. Denies depression, anxiety or insomnia. Denies skin break down or rash.        Objective:   Physical Exam  Pleasant well nourished female, alert and oriented x 3, in no cardio-pulmonary distress. Afebrile. HEENT No facial trauma or asymetry. Sinuses non tender.  EOMI, PERTL, fundoscopic exam  no hemorhage or exudate.  External ears normal, tympanic membranes clear. Oropharynx moist, no exudate, good dentition. Neck: supple, no adenopathy,JVD or thyromegaly.No bruits.  Chest: Clear to ascultation bilaterally.No crackles or wheezes. Non tender to palpation  Breast: No asymetry,no masses. No nipple discharge or inversion. No axillary or supraclavicular adenopathy  Cardiovascular system; Heart sounds normal,  S1 and  S2 ,no S3.  No murmur, or thrill. Apical beat not displaced Peripheral pulses normal.  Abdomen: Soft, non tender, no organomegaly or masses. No bruits. Bowel sounds normal. No guarding, tenderness or rebound.  Rectal:  No mass. Guaiac negative stool.  GU: External genitalia normal. No lesions. Vaginal canal normal.No discharge. Uterus absent, no adnexal masses, no  adnexal  tenderness.  Musculoskeletal exam: Full ROM of spine, hips , shoulders and knees. No deformity ,swelling or crepitus noted. No muscle wasting or atrophy.   Neurologic: Cranial nerves 2 to 12 intact. Power, tone ,sensation and reflexes normal throughout. No disturbance in gait. No tremor.  Skin: Intact, no ulceration, erythema , scaling or rash noted. Pigmentation normal throughout  Psych; Normal mood and affect. Judgement and concentration normal       Assessment & Plan:  Routine general medical examination at a health care facility Annual exam as documented. Counseling done  re healthy lifestyle involving commitment to 150 minutes exercise per week, heart healthy diet, and attaining healthy weight.The importance of adequate sleep also discussed. Regular seat belt use and safe storage  of firearms if patient has them, is also discussed. Changes in health habits are decided on by the patient with goals and time frames  set for achieving them. Immunization and cancer screening needs are specifically addressed at this visit.

## 2014-02-05 NOTE — Assessment & Plan Note (Signed)
Annual exam as documented. Counseling done  re healthy lifestyle involving commitment to 150 minutes exercise per week, heart healthy diet, and attaining healthy weight.The importance of adequate sleep also discussed. Regular seat belt use and safe storage  of firearms if patient has them, is also discussed. Changes in health habits are decided on by the patient with goals and time frames  set for achieving them. Immunization and cancer screening needs are specifically addressed at this visit.  

## 2014-02-06 ENCOUNTER — Telehealth: Payer: Self-pay | Admitting: Family Medicine

## 2014-02-07 NOTE — Telephone Encounter (Signed)
Called and left message for patient to return call.  

## 2014-02-09 ENCOUNTER — Other Ambulatory Visit: Payer: Self-pay

## 2014-02-09 DIAGNOSIS — E119 Type 2 diabetes mellitus without complications: Secondary | ICD-10-CM

## 2014-02-09 MED ORDER — CANAGLIFLOZIN 300 MG PO TABS: 1.0000 | ORAL_TABLET | Freq: Every day | ORAL | Status: DC

## 2014-02-09 NOTE — Telephone Encounter (Signed)
Patient aware she is to take the invokana. And refill sent in

## 2014-02-14 ENCOUNTER — Ambulatory Visit (INDEPENDENT_AMBULATORY_CARE_PROVIDER_SITE_OTHER): Payer: 59 | Admitting: Family Medicine

## 2014-02-14 VITALS — BP 143/90 | HR 77 | Wt 246.6 lb

## 2014-02-14 DIAGNOSIS — E119 Type 2 diabetes mellitus without complications: Secondary | ICD-10-CM

## 2014-02-14 NOTE — Progress Notes (Signed)
Patient presents today for DM follow-up as part of employer-sponsored Link to [employer] Program. Medications, glucose readings, a1c and compliance have been reviewed. I have also discussed with patient lifestyle interventions including diet and exercise. Details of the visit can be found in [employer] documenting program through [facility] Patient has set a series of personal goals and will follow-up in no more than 3 months for further review of DM. 

## 2014-04-10 NOTE — Progress Notes (Signed)
Patient ID: Tina Wilkins, female   DOB: 08/19/1956, 58 y.o.   MRN: [id number] ATTENDING PHYSICIAN NOTE: I have reviewed the chart and agree with the plan as detailed above. Denny LevySara Cuong Moorman MD Pager [phone]

## 2014-06-07 ENCOUNTER — Ambulatory Visit (INDEPENDENT_AMBULATORY_CARE_PROVIDER_SITE_OTHER): Payer: 59 | Admitting: Family Medicine

## 2014-06-07 ENCOUNTER — Encounter: Payer: Self-pay | Admitting: Family Medicine

## 2014-06-07 ENCOUNTER — Encounter (INDEPENDENT_AMBULATORY_CARE_PROVIDER_SITE_OTHER): Payer: Self-pay

## 2014-06-07 VITALS — BP 122/82 | HR 68 | Resp 16 | Wt 245.1 lb

## 2014-06-07 DIAGNOSIS — E559 Vitamin D deficiency, unspecified: Secondary | ICD-10-CM

## 2014-06-07 DIAGNOSIS — M549 Dorsalgia, unspecified: Secondary | ICD-10-CM

## 2014-06-07 DIAGNOSIS — J301 Allergic rhinitis due to pollen: Secondary | ICD-10-CM

## 2014-06-07 DIAGNOSIS — E669 Obesity, unspecified: Secondary | ICD-10-CM

## 2014-06-07 DIAGNOSIS — E785 Hyperlipidemia, unspecified: Secondary | ICD-10-CM

## 2014-06-07 DIAGNOSIS — E119 Type 2 diabetes mellitus without complications: Secondary | ICD-10-CM

## 2014-06-07 DIAGNOSIS — I10 Essential (primary) hypertension: Secondary | ICD-10-CM

## 2014-06-07 LAB — HEMOGLOBIN A1C
HEMOGLOBIN A1C: 6.7 % — AB (ref <5.7)
MEAN PLASMA GLUCOSE: 146 mg/dL — AB (ref <117)

## 2014-06-07 MED ORDER — PREDNISONE (PAK) 5 MG PO TABS: 5.0000 mg | ORAL_TABLET | ORAL | Status: DC

## 2014-06-07 MED ORDER — METFORMIN HCL 1000 MG PO TABS: 1000.0000 mg | ORAL_TABLET | Freq: Two times a day (BID) | ORAL | Status: DC

## 2014-06-07 MED ORDER — TRIAMTERENE-HCTZ 37.5-25 MG PO TABS: 1.0000 | ORAL_TABLET | Freq: Every day | ORAL | Status: DC

## 2014-06-07 MED ORDER — METHYLPREDNISOLONE ACETATE 80 MG/ML IJ SUSP
80.0000 mg | Freq: Once | INTRAMUSCULAR | Status: AC
  Administered 2014-06-07: 80 mg via INTRAMUSCULAR

## 2014-06-07 MED ORDER — FLUTICASONE PROPIONATE 50 MCG/ACT NA SUSP: 2.0000 | Freq: Every day | NASAL | Status: DC

## 2014-06-07 MED ORDER — METHOCARBAMOL 500 MG PO TABS: ORAL_TABLET | ORAL | Status: DC

## 2014-06-07 MED ORDER — ROSUVASTATIN CALCIUM 20 MG PO TABS: 20.0000 mg | ORAL_TABLET | Freq: Every day | ORAL | Status: DC

## 2014-06-07 MED ORDER — BENAZEPRIL HCL 40 MG PO TABS: ORAL_TABLET | ORAL | Status: DC

## 2014-06-07 MED ORDER — KETOROLAC TROMETHAMINE 60 MG/2ML IM SOLN
60.0000 mg | Freq: Once | INTRAMUSCULAR | Status: AC
  Administered 2014-06-07: 60 mg via INTRAMUSCULAR

## 2014-06-07 MED ORDER — CLONIDINE HCL 0.3 MG PO TABS: ORAL_TABLET | ORAL | Status: DC

## 2014-06-07 MED ORDER — ERGOCALCIFEROL 1.25 MG (50000 UT) PO CAPS: 50000.0000 [IU] | ORAL_CAPSULE | ORAL | Status: DC

## 2014-06-07 MED ORDER — IBUPROFEN 800 MG PO TABS: 800.0000 mg | ORAL_TABLET | Freq: Three times a day (TID) | ORAL | Status: DC | PRN

## 2014-06-07 NOTE — Assessment & Plan Note (Signed)
Controlled, no change in medication Patient advised to reduce carb and sweets, commit to regular physical activity, take meds as prescribed, test blood as directed, and attempt to lose weight, to improve blood sugar control.  

## 2014-06-07 NOTE — Assessment & Plan Note (Signed)
Unchnaged. Patient re-educated about  the importance of commitment to a  minimum of 150 minutes of exercise per week. The importance of healthy food choices with portion control discussed. Encouraged to start a food diary, count calories and to consider  joining a support group. Sample diet sheets offered. Goals set by the patient for the next several months.    
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
## 2014-06-07 NOTE — Patient Instructions (Signed)
F/u in 3.5 month, call if you need me before  Blood pressure is excellent   Toradol  And depo medrol in office today for back pain, and medication is also sent in for this  To [facility]  New additional med for uncontrolled allergies is flonase fill through hospital  Pls cut back on rice potato, sweets to  Help with weight loss

## 2014-06-07 NOTE — Assessment & Plan Note (Signed)
Uncontrolled Hyperlipidemia:Low fat diet discussed and encouraged.  Will f/u on recent labs

## 2014-06-07 NOTE — Assessment & Plan Note (Signed)
Uncontrolled, pt to start flonase daily and continue claritin

## 2014-06-07 NOTE — Assessment & Plan Note (Signed)
Increased and uncontrolled Toradol and depo medrol in office and short , sharp course of anti inflammatories Pt not interested in xray or PT at this time, has had same dx by orhto less than 2 years ago and PT was not beneficial

## 2014-06-07 NOTE — Assessment & Plan Note (Signed)
Controlled, no change in medication DASH diet and commitment to daily physical activity for a minimum of 30 minutes discussed and encouraged, as a part of hypertension management. The importance of attaining a healthy weight is also discussed.  

## 2014-06-08 LAB — COMPLETE METABOLIC PANEL WITH GFR
ALT: 28 U/L (ref 0–35)
AST: 25 U/L (ref 0–37)
Albumin: 4 g/dL (ref 3.5–5.2)
Alkaline Phosphatase: 75 U/L (ref 39–117)
BUN: 13 mg/dL (ref 6–23)
CO2: 28 mEq/L (ref 19–32)
CREATININE: 0.8 mg/dL (ref 0.50–1.10)
Calcium: 9.7 mg/dL (ref 8.4–10.5)
Chloride: 104 mEq/L (ref 96–112)
GFR, Est Non African American: 81 mL/min
Glucose, Bld: 114 mg/dL — ABNORMAL HIGH (ref 70–99)
Potassium: 4.1 mEq/L (ref 3.5–5.3)
Sodium: 142 mEq/L (ref 135–145)
Total Bilirubin: 0.5 mg/dL (ref 0.2–1.2)
Total Protein: 6.5 g/dL (ref 6.0–8.3)

## 2014-06-08 LAB — LIPID PANEL
Cholesterol: 141 mg/dL (ref 0–200)
HDL: 54 mg/dL (ref >39)
LDL CALC: 70 mg/dL (ref 0–99)
Total CHOL/HDL Ratio: 2.6 Ratio
Triglycerides: 86 mg/dL (ref <150)
VLDL: 17 mg/dL (ref 0–40)

## 2014-08-08 ENCOUNTER — Other Ambulatory Visit: Payer: Self-pay | Admitting: Family Medicine

## 2014-08-08 DIAGNOSIS — Z1231 Encounter for screening mammogram for malignant neoplasm of breast: Secondary | ICD-10-CM

## 2014-08-18 ENCOUNTER — Ambulatory Visit (HOSPITAL_COMMUNITY)
Admission: RE | Admit: 2014-08-18 | Discharge: 2014-08-18 | Disposition: A | Discharge status: Home / Self Care | Payer: 59 | Source: Ambulatory Visit | Attending: Family Medicine | Admitting: Family Medicine

## 2014-08-18 DIAGNOSIS — Z1231 Encounter for screening mammogram for malignant neoplasm of breast: Secondary | ICD-10-CM | POA: Insufficient documentation

## 2014-08-24 ENCOUNTER — Other Ambulatory Visit: Payer: Self-pay | Admitting: Family Medicine

## 2014-09-01 ENCOUNTER — Other Ambulatory Visit: Payer: Self-pay | Admitting: Family Medicine

## 2014-09-01 ENCOUNTER — Ambulatory Visit (INDEPENDENT_AMBULATORY_CARE_PROVIDER_SITE_OTHER): Payer: Self-pay | Admitting: Family Medicine

## 2014-09-01 VITALS — BP 139/89 | HR 76 | Ht 63.0 in | Wt 243.6 lb

## 2014-09-01 DIAGNOSIS — E119 Type 2 diabetes mellitus without complications: Secondary | ICD-10-CM

## 2014-09-01 NOTE — Progress Notes (Signed)
Patient presents today for DM follow-up as part of employer-sponsored Link to [employer] Program. Medications, glucose readings, a1c and compliance have been reviewed. I have also discussed with patient lifestyle interventions including diet and exercise. Details of the visit can be found in [employer] documenting program through [facility] Patient has set a series of personal goals and will follow-up in no more than 3 months for further review of DM. 

## 2014-09-11 ENCOUNTER — Other Ambulatory Visit: Payer: Self-pay

## 2014-09-11 ENCOUNTER — Telehealth: Payer: Self-pay

## 2014-09-11 MED ORDER — AMLODIPINE BESYLATE 10 MG PO TABS: 10.0000 mg | ORAL_TABLET | Freq: Every day | ORAL | Status: DC

## 2014-09-11 NOTE — Telephone Encounter (Signed)
Amlodipine refilled.

## 2014-10-17 ENCOUNTER — Other Ambulatory Visit: Payer: Self-pay

## 2014-10-17 DIAGNOSIS — E119 Type 2 diabetes mellitus without complications: Secondary | ICD-10-CM

## 2014-10-17 DIAGNOSIS — E785 Hyperlipidemia, unspecified: Secondary | ICD-10-CM

## 2014-10-17 LAB — COMPLETE METABOLIC PANEL WITH GFR
ALT: 18 U/L (ref 0–35)
AST: 17 U/L (ref 0–37)
Albumin: 4.5 g/dL (ref 3.5–5.2)
Alkaline Phosphatase: 89 U/L (ref 39–117)
BUN: 14 mg/dL (ref 6–23)
CALCIUM: 10 mg/dL (ref 8.4–10.5)
CHLORIDE: 101 meq/L (ref 96–112)
CO2: 28 mEq/L (ref 19–32)
Creat: 1.05 mg/dL (ref 0.50–1.10)
GFR, EST NON AFRICAN AMERICAN: 58 mL/min — AB
GFR, Est African American: 67 mL/min
Glucose, Bld: 94 mg/dL (ref 70–99)
POTASSIUM: 4.1 meq/L (ref 3.5–5.3)
Sodium: 140 mEq/L (ref 135–145)
Total Bilirubin: 0.8 mg/dL (ref 0.2–1.2)
Total Protein: 7.8 g/dL (ref 6.0–8.3)

## 2014-10-17 LAB — LIPID PANEL
Cholesterol: 160 mg/dL (ref 0–200)
HDL: 65 mg/dL (ref >39)
LDL CALC: 80 mg/dL (ref 0–99)
Total CHOL/HDL Ratio: 2.5 Ratio
Triglycerides: 76 mg/dL (ref <150)
VLDL: 15 mg/dL (ref 0–40)

## 2014-10-17 NOTE — Progress Notes (Signed)
Patient ID: Tina Wilkins, female   DOB: 03/06/1956, 58 y.o.   MRN: [id number] Reviewed: Agree with the documentation and management [facility].

## 2014-10-18 ENCOUNTER — Ambulatory Visit (INDEPENDENT_AMBULATORY_CARE_PROVIDER_SITE_OTHER): Payer: 59 | Admitting: Family Medicine

## 2014-10-18 ENCOUNTER — Encounter: Payer: Self-pay | Admitting: Family Medicine

## 2014-10-18 VITALS — BP 120/82 | HR 77 | Resp 16 | Ht 63.0 in | Wt 244.4 lb

## 2014-10-18 DIAGNOSIS — Z23 Encounter for immunization: Secondary | ICD-10-CM | POA: Insufficient documentation

## 2014-10-18 DIAGNOSIS — J320 Chronic maxillary sinusitis: Secondary | ICD-10-CM | POA: Insufficient documentation

## 2014-10-18 DIAGNOSIS — K219 Gastro-esophageal reflux disease without esophagitis: Secondary | ICD-10-CM

## 2014-10-18 DIAGNOSIS — I10 Essential (primary) hypertension: Secondary | ICD-10-CM

## 2014-10-18 DIAGNOSIS — E119 Type 2 diabetes mellitus without complications: Secondary | ICD-10-CM

## 2014-10-18 DIAGNOSIS — E785 Hyperlipidemia, unspecified: Secondary | ICD-10-CM

## 2014-10-18 DIAGNOSIS — E559 Vitamin D deficiency, unspecified: Secondary | ICD-10-CM

## 2014-10-18 LAB — HEMOGLOBIN A1C
HEMOGLOBIN A1C: 6.8 % — AB (ref <5.7)
Mean Plasma Glucose: 148 mg/dL — ABNORMAL HIGH (ref <117)

## 2014-10-18 MED ORDER — PENICILLIN V POTASSIUM 500 MG PO TABS: 500.0000 mg | ORAL_TABLET | Freq: Three times a day (TID) | ORAL | Status: DC

## 2014-10-18 MED ORDER — FLUCONAZOLE 150 MG PO TABS: ORAL_TABLET | ORAL | Status: DC

## 2014-10-18 NOTE — Patient Instructions (Addendum)
F/u in  March 10 or after with foot exam, call if you need me before  Zostavax today, penicilln sent in for `1 week, and  Fluconazole also for sinus infection   It is important that you exercise regularly at least 30 minutes 5 times a week. If you develop chest pain, have severe difficulty breathing, or feel very tired, stop exercising immediately and seek medical attention    Non fasting cmp and EGFr, microalb,, CBC, TSH, Vit D March 5 or after

## 2014-10-23 ENCOUNTER — Other Ambulatory Visit: Payer: Self-pay | Admitting: Family Medicine

## 2014-12-23 NOTE — Assessment & Plan Note (Signed)
unchanged Patient re-educated about  the importance of commitment to a  minimum of 150 minutes of exercise per week. The importance of healthy food choices with portion control discussed. Encouraged to start a food diary, count calories and to consider  joining a support group. Sample diet sheets offered. Goals set by the patient for the next several months.    

## 2014-12-23 NOTE — Assessment & Plan Note (Signed)
Vaccine administered at visit.  

## 2014-12-23 NOTE — Assessment & Plan Note (Signed)
Controlled, no change in medication  

## 2014-12-23 NOTE — Progress Notes (Signed)
   Subjective:    Patient ID: Tina Wilkins, female    DOB: 08/11/1956, 59 y.o.   MRN: [id number]  HPI The PT is here for follow up and re-evaluation of chronic medical conditions, medication management and review of any available recent lab and radiology data.  Preventive health is updated, specifically  Cancer screening and Immunization.   Questions or concerns regarding consultations or procedures which the PT has had in the interim are  addressed. The PT denies any adverse reactions to current medications since the last visit.  There are no new concerns.  There are no specific complaints    1 week h/o head congestion with yellow post nasal drainage intermittent chills and a lot of sick contact at work Denies polyuria, polydipsia, blurred vision , or hypoglycemic episodes. Still working on healthy lifestyle , more active, weight loss is stubborn however   Review of Systems See HPI Denies chest congestion, productive cough or wheezing. Denies chest pains, palpitations and leg swelling Denies abdominal pain, nausea, vomiting,diarrhea or constipation.   Denies dysuria, frequency, hesitancy or incontinence. Denies joint pain, swelling and limitation in mobility. Denies headaches, seizures, numbness, or tingling. Denies depression, anxiety or insomnia. Denies skin break down or rash.        Objective:   Physical Exam  BP 120/82 mmHg  Pulse 77  Resp 16  Ht 5\' 3"  (1.6 m)  Wt 244 lb 6.4 oz (110.859 kg)  BMI 43.30 kg/m2  SpO2 97% Patient alert and oriented and in no cardiopulmonary distress.  HEENT: No facial asymmetry, EOMI,   oropharynx pink and moist.  Neck supple no JVD, no mass. Left  maxillary sinuse tender, tender anterior cervical adenitis left , TM clear Chest: Clear to auscultation bilaterally.  CVS: S1, S2 no murmurs, no S3.Regular rate.  ABD: Soft non tender.   Ext: No edema  MS: Adequate ROM spine, shoulders, hips and knees.  Skin: Intact, no  ulcerations or rash noted.  Psych: Good eye contact, normal affect. Memory intact not anxious or depressed appearing.  CNS: CN 2-12 intact, power,  normal throughout.no focal deficits noted.       Assessment & Plan:  Left maxillary sinusitis Antibiotic prescribed, nasal saline ;lushes daily encouraged   Diabetes type 2, controlled Controlled, no change in medication Patient advised to reduce carb and sweets, commit to regular physical activity, take meds as prescribed, test blood as directed, and attempt to lose weight, to improve blood sugar control.    Hyperlipidemia Hyperlipidemia:Low fat diet discussed and encouraged.  Controlled, no change in medication    Morbid obesity unchanged. Patient re-educated about  the importance of commitment to a  minimum of 150 minutes of exercise per week. The importance of healthy food choices with portion control discussed. Encouraged to start a food diary, count calories and to consider  joining a support group. Sample diet sheets offered. Goals set by the patient for the next several months.      Need for Zostavax administration Vaccine administered at visit.    Hypertension goal BP (blood pressure) < 130/80 Controlled, no change in medication DASH diet and commitment to daily physical activity for a minimum of 30 minutes discussed and encouraged, as a part of hypertension management. The importance of attaining a healthy weight is also discussed.

## 2014-12-23 NOTE — Assessment & Plan Note (Signed)
Controlled, no change in medication Patient advised to reduce carb and sweets, commit to regular physical activity, take meds as prescribed, test blood as directed, and attempt to lose weight, to improve blood sugar control.  

## 2014-12-23 NOTE — Assessment & Plan Note (Signed)
Controlled, no change in medication DASH diet and commitment to daily physical activity for a minimum of 30 minutes discussed and encouraged, as a part of hypertension management. The importance of attaining a healthy weight is also discussed.  

## 2014-12-23 NOTE — Assessment & Plan Note (Signed)
Antibiotic prescribed, nasal saline ;lushes daily encouraged

## 2014-12-23 NOTE — Assessment & Plan Note (Signed)
Hyperlipidemia:Low fat diet discussed and encouraged.  Controlled, no change in medication   

## 2015-02-14 ENCOUNTER — Encounter: Payer: Self-pay | Admitting: Family Medicine

## 2015-02-14 ENCOUNTER — Other Ambulatory Visit: Payer: Self-pay | Admitting: Family Medicine

## 2015-02-14 ENCOUNTER — Ambulatory Visit (INDEPENDENT_AMBULATORY_CARE_PROVIDER_SITE_OTHER): Payer: 59 | Admitting: Family Medicine

## 2015-02-14 VITALS — BP 124/82 | HR 76 | Resp 16 | Ht 63.0 in | Wt 245.0 lb

## 2015-02-14 DIAGNOSIS — N3001 Acute cystitis with hematuria: Secondary | ICD-10-CM

## 2015-02-14 DIAGNOSIS — E785 Hyperlipidemia, unspecified: Secondary | ICD-10-CM

## 2015-02-14 DIAGNOSIS — K219 Gastro-esophageal reflux disease without esophagitis: Secondary | ICD-10-CM

## 2015-02-14 DIAGNOSIS — E119 Type 2 diabetes mellitus without complications: Secondary | ICD-10-CM

## 2015-02-14 DIAGNOSIS — Z1159 Encounter for screening for other viral diseases: Secondary | ICD-10-CM | POA: Diagnosis not present

## 2015-02-14 DIAGNOSIS — Z23 Encounter for immunization: Secondary | ICD-10-CM | POA: Diagnosis not present

## 2015-02-14 DIAGNOSIS — I10 Essential (primary) hypertension: Secondary | ICD-10-CM

## 2015-02-14 DIAGNOSIS — J301 Allergic rhinitis due to pollen: Secondary | ICD-10-CM

## 2015-02-14 NOTE — Patient Instructions (Signed)
Follow up in 4 months  We will call with the results of your labwork and urine test.   Thank you for choosing [facility], it is always a privilege to care for you!

## 2015-02-15 LAB — HIV ANTIBODY (ROUTINE TESTING W REFLEX): HIV 1&2 Ab, 4th Generation: NONREACTIVE

## 2015-02-15 LAB — URINALYSIS W MICROSCOPIC + REFLEX CULTURE
Bilirubin Urine: NEGATIVE
Casts: NONE SEEN
Crystals: NONE SEEN
HGB URINE DIPSTICK: NEGATIVE
KETONES UR: NEGATIVE mg/dL
Nitrite: NEGATIVE
Protein, ur: NEGATIVE mg/dL
SPECIFIC GRAVITY, URINE: 1.024 (ref 1.005–1.030)
SQUAMOUS EPITHELIAL / LPF: NONE SEEN
UROBILINOGEN UA: 0.2 mg/dL (ref 0.0–1.0)
WBC, UA: >50 WBC/hpf — AB (ref <3)
pH: 6 (ref 5.0–8.0)

## 2015-02-15 LAB — COMPLETE METABOLIC PANEL WITH GFR
ALBUMIN: 4.3 g/dL (ref 3.5–5.2)
ALT: 21 U/L (ref 0–35)
AST: 17 U/L (ref 0–37)
Alkaline Phosphatase: 91 U/L (ref 39–117)
BUN: 15 mg/dL (ref 6–23)
CO2: 26 mEq/L (ref 19–32)
CREATININE: 0.97 mg/dL (ref 0.50–1.10)
Calcium: 10.1 mg/dL (ref 8.4–10.5)
Chloride: 101 mEq/L (ref 96–112)
GFR, EST NON AFRICAN AMERICAN: 64 mL/min
GFR, Est African American: 73 mL/min
Glucose, Bld: 108 mg/dL — ABNORMAL HIGH (ref 70–99)
Potassium: 3.8 mEq/L (ref 3.5–5.3)
Sodium: 138 mEq/L (ref 135–145)
TOTAL PROTEIN: 7.1 g/dL (ref 6.0–8.3)
Total Bilirubin: 0.7 mg/dL (ref 0.2–1.2)

## 2015-02-15 LAB — CBC WITH DIFFERENTIAL/PLATELET
Basophils Absolute: 0.1 10*3/uL (ref 0.0–0.1)
Basophils Relative: 1 % (ref 0–1)
Eosinophils Absolute: 0.3 10*3/uL (ref 0.0–0.7)
Eosinophils Relative: 4 % (ref 0–5)
HEMATOCRIT: 36.8 % (ref 36.0–46.0)
Hemoglobin: 12 g/dL (ref 12.0–15.0)
LYMPHS PCT: 35 % (ref 12–46)
Lymphs Abs: 2.5 10*3/uL (ref 0.7–4.0)
MCH: 27.6 pg (ref 26.0–34.0)
MCHC: 32.6 g/dL (ref 30.0–36.0)
MCV: 84.6 fL (ref 78.0–100.0)
MONOS PCT: 5 % (ref 3–12)
MPV: 9.8 fL (ref 9.4–12.4)
Monocytes Absolute: 0.4 10*3/uL (ref 0.1–1.0)
Neutro Abs: 4 10*3/uL (ref 1.7–7.7)
Neutrophils Relative %: 55 % (ref 43–77)
Platelets: 435 10*3/uL — ABNORMAL HIGH (ref 150–400)
RBC: 4.35 MIL/uL (ref 3.87–5.11)
RDW: 15 % (ref 11.5–15.5)
WBC: 7.2 10*3/uL (ref 4.0–10.5)

## 2015-02-15 LAB — VITAMIN D 25 HYDROXY (VIT D DEFICIENCY, FRACTURES): Vit D, 25-Hydroxy: 23 ng/mL — ABNORMAL LOW (ref 30–100)

## 2015-02-15 LAB — TSH: TSH: 2.005 u[IU]/mL (ref 0.350–4.500)

## 2015-02-15 LAB — MICROALBUMIN / CREATININE URINE RATIO
Creatinine, Urine: 114.1 mg/dL
MICROALB UR: 4.3 mg/dL — AB (ref <2.0)
Microalb Creat Ratio: 37.7 mg/g — ABNORMAL HIGH (ref 0.0–30.0)

## 2015-02-16 LAB — HEMOGLOBIN A1C
Hgb A1c MFr Bld: 7.3 % — ABNORMAL HIGH (ref <5.7)
MEAN PLASMA GLUCOSE: 163 mg/dL — AB (ref <117)

## 2015-02-17 LAB — URINE CULTURE: Colony Count: >=100000

## 2015-02-19 NOTE — Addendum Note (Signed)
Addended by: Kandis FantasiaSLADE, COURTNEY B on: 02/19/2015 10:50 AM   Modules accepted: Orders

## 2015-02-27 ENCOUNTER — Other Ambulatory Visit: Payer: Self-pay | Admitting: Family Medicine

## 2015-02-27 DIAGNOSIS — Z23 Encounter for immunization: Secondary | ICD-10-CM | POA: Insufficient documentation

## 2015-02-27 DIAGNOSIS — N3001 Acute cystitis with hematuria: Secondary | ICD-10-CM | POA: Insufficient documentation

## 2015-02-27 NOTE — Assessment & Plan Note (Signed)
Controlled, no change in medication DASH diet and commitment to daily physical activity for a minimum of 30 minutes discussed and encouraged, as a part of hypertension management. The importance of attaining a healthy weight is also discussed.  BP/Weight 02/14/2015 10/18/2014 09/01/2014 06/07/2014 02/14/2014 02/02/2014 10/12/2013  Systolic BP 124 120 139 122 143 124 153  Diastolic BP 82 82 89 82 90 86 [id number]  Wt. (Lbs) 245 244.4 243.6 245.12 246.6 246 246.8  BMI 43.41 43.3 43.16 43.43 43.69 43.59 43.73    CMP Latest Ref Rng 02/14/2015 10/17/2014 06/07/2014  Glucose 70 - 99 mg/dL 096(E108(H) 94 454(U114(H)  BUN 6 - 23 mg/dL 15 14 13   Creatinine 0.50 - 1.10 mg/dL 9.810.97 1.911.05 4.780.80  Sodium 135 - 145 mEq/L 138 140 142  Potassium 3.5 - 5.3 mEq/L 3.8 4.1 4.1  Chloride 96 - 112 mEq/L 101 101 104  CO2 19 - 32 mEq/L 26 28 28   Calcium 8.4 - 10.5 mg/dL 29.510.1 62.110.0 9.7  Total Protein 6.0 - 8.3 g/dL 7.1 7.8 6.5  Total Bilirubin 0.2 - 1.2 mg/dL 0.7 0.8 0.5  Alkaline Phos 39 - 117 U/L 91 89 75  AST 0 - 37 U/L 17 17 25   ALT 0 - 35 U/L 21 18 28

## 2015-02-27 NOTE — Assessment & Plan Note (Signed)
After obtaining informed consent, the vaccine is  administered by LPN.  

## 2015-02-27 NOTE — Assessment & Plan Note (Signed)
Abnormal UA, will hold on c/s prior to prescribing antibiotic

## 2015-02-27 NOTE — Assessment & Plan Note (Signed)
Controlled, no change in medication Updated lab needed at/ before next visit. Hyperlipidemia:Low fat diet discussed and encouraged.  CMP Latest Ref Rng 02/14/2015 10/17/2014 06/07/2014  Glucose 70 - 99 mg/dL 098(J108(H) 94 191(Y114(H)  BUN 6 - 23 mg/dL 15 14 13   Creatinine 0.50 - 1.10 mg/dL 7.820.97 9.561.05 2.130.80  Sodium 135 - 145 mEq/L 138 140 142  Potassium 3.5 - 5.3 mEq/L 3.8 4.1 4.1  Chloride 96 - 112 mEq/L 101 101 104  CO2 19 - 32 mEq/L 26 28 28   Calcium 8.4 - 10.5 mg/dL 08.610.1 57.810.0 9.7  Total Protein 6.0 - 8.3 g/dL 7.1 7.8 6.5  Total Bilirubin 0.2 - 1.2 mg/dL 0.7 0.8 0.5  Alkaline Phos 39 - 117 U/L 91 89 75  AST 0 - 37 U/L 17 17 25   ALT 0 - 35 U/L 21 18 28     Lipid Panel     Component Value Date/Time   CHOL 160 10/17/2014 1004   TRIG 76 10/17/2014 1004   HDL 65 10/17/2014 1004   CHOLHDL 2.5 10/17/2014 1004   VLDL 15 10/17/2014 1004   LDLCALC 80 10/17/2014 1004

## 2015-02-27 NOTE — Assessment & Plan Note (Signed)
Controlled, no change in medication Patient advised to reduce carb and sweets, commit to regular physical activity, take meds as prescribed, test blood as directed, and attempt to lose weight, to improve blood sugar control.  

## 2015-02-27 NOTE — Assessment & Plan Note (Signed)
Deteriorated. Patient re-educated about  the importance of commitment to a  minimum of 150 minutes of exercise per week.  The importance of healthy food choices with portion control discussed. Encouraged to start a food diary, count calories and to consider  joining a support group. Sample diet sheets offered. Goals set by the patient for the next several months.   Wt Readings from Last 3 Encounters:  02/14/15 245 lb (111.131 kg)  10/18/14 244 lb 6.4 oz (110.859 kg)  09/01/14 243 lb 9.6 oz (110.496 kg)    Body mass index is 43.41 kg/(m^2).  Current exercise per week 90 minutes.

## 2015-02-27 NOTE — Assessment & Plan Note (Signed)
Controlled, no change in medication  

## 2015-02-27 NOTE — Progress Notes (Signed)
Tina Wilkins     MRN: [id number]      DOB: 26-Jun-1956   HPI Tina Wilkins is here for follow up and re-evaluation of chronic medical conditions, medication management and review of any available recent lab and radiology data.  Preventive health is updated, specifically  Cancer screening and Immunization.   Questions or concerns regarding consultations or procedures which the PT has had in the interim are  addressed. The PT denies any adverse reactions to current medications since the last visit.  Denies polyuria, polydipsia, blurred vision , or hypoglycemic episodes.    ROS Denies recent fever or chills. Denies sinus pressure, nasal congestion, ear pain or sore throat. Denies chest congestion, productive cough or wheezing. Denies chest pains, palpitations and leg swelling Denies abdominal pain, nausea, vomiting,diarrhea or constipation.   Denies dysuria, 2 day h/o increased frequency, denies  hesitancy or incontinence. Denies joint pain, swelling and limitation in mobility. Denies headaches, seizures, numbness, or tingling. Denies depression, anxiety or insomnia. Denies skin break down or rash.   PE  BP 124/82 mmHg  Pulse 76  Resp 16  Ht  (1.6 m)  Wt 245 lb (111.131 kg)  BMI 43.41 kg/m2  SpO2 96%  Patient alert and oriented and in no cardiopulmonary distress.  HEENT: No facial asymmetry, EOMI,   oropharynx Wilkins and moist.  Neck supple no JVD, no mass.  Chest: Clear to auscultation bilaterally.  CVS: S1, S2 no murmurs, no S3.Regular rate.  ABD: Soft non tender.   Ext: No edema  MS: Adequate ROM spine, shoulders, hips and knees.  Skin: Intact, no ulcerations or rash noted.  Psych: Good eye contact, normal affect. Memory intact not anxious or depressed appearing.  CNS: CN 2-12 intact, power,  normal throughout.no focal deficits noted.   Assessment & Plan   Hypertension goal BP (blood pressure) < 130/80 Controlled, no change in medication DASH diet and  commitment to daily physical activity for a minimum of 30 minutes discussed and encouraged, as a part of hypertension management. The importance of attaining a healthy weight is also discussed.  BP/Weight 02/14/2015 10/18/2014 09/01/2014 06/07/2014 02/14/2014 02/02/2014 10/12/2013  Systolic BP 124 120 139 122 143 124 153  Diastolic BP 82 82 89 82 90 86 409  Wt. (Lbs) 245 244.4 243.6 245.12 246.6 246 246.8  BMI 43.41 43.3 43.16 43.43 43.69 43.59 43.73    CMP Latest Ref Rng 02/14/2015 10/17/2014 06/07/2014  Glucose 70 - 99 mg/dL 811(B) 94 147(W)  BUN 6 - 23 mg/dL Creatinine 0.50 - 1.10 mg/dL 2.95 6.21 3.08  Sodium 135 - 145 mEq/L 138 140 142  Potassium 3.5 - 5.3 mEq/L 3.8 4.1 4.1  Chloride 96 - 112 mEq/L 101 101 104  CO2 19 - 32 mEq/L Calcium 8.4 - 10.5 mg/dL 65.7 84.6 9.7  Total Protein 6.0 - 8.3 g/dL 7.1 7.8 6.5  Total Bilirubin 0.2 - 1.2 mg/dL 0.7 0.8 0.5  Alkaline Phos 39 - 117 U/L 91 89 75  AST 0 - 37 U/L ALT 0 - 35 U/L ALLERGIC RHINITIS, SEASONAL Controlled, no change in medication    Morbid obesity Deteriorated. Patient re-educated about  the importance of commitment to a  minimum of 150 minutes of exercise per week.  The importance of healthy food choices with portion control discussed. Encouraged to start a food diary, count calories and to consider  joining a support group. Sample diet sheets offered. Goals set by the patient for the next several months.   Wt Readings from Last 3 Encounters:  02/14/15 245 lb (111.131 kg)  10/18/14 244 lb 6.4 oz (110.859 kg)  09/01/14 243 lb 9.6 oz (110.496 kg)    Body mass index is 43.41 kg/(m^2).  Current exercise per week 90 minutes.    Hyperlipidemia Controlled, no change in medication Updated lab needed at/ before next visit. Hyperlipidemia:Low fat diet discussed and encouraged.  CMP Latest Ref Rng 02/14/2015 10/17/2014 06/07/2014  Glucose 70 - 99 mg/dL 161(W108(H) 94 960(A114(H)  BUN 6  - 23 mg/dL 15 14 13   Creatinine 0.50 - 1.10 mg/dL 5.400.97 9.811.05 1.910.80  Sodium 135 - 145 mEq/L 138 140 142  Potassium 3.5 - 5.3 mEq/L 3.8 4.1 4.1  Chloride 96 - 112 mEq/L 101 101 104  CO2 19 - 32 mEq/L 26 28 28   Calcium 8.4 - 10.5 mg/dL 47.810.1 29.510.0 9.7  Total Protein 6.0 - 8.3 g/dL 7.1 7.8 6.5  Total Bilirubin 0.2 - 1.2 mg/dL 0.7 0.8 0.5  Alkaline Phos 39 - 117 U/L 91 89 75  AST 0 - 37 U/L 17 17 25   ALT 0 - 35 U/L 21 18 28     Lipid Panel     Component Value Date/Time   CHOL 160 10/17/2014 1004   TRIG 76 10/17/2014 1004   HDL 65 10/17/2014 1004   CHOLHDL 2.5 10/17/2014 1004   VLDL 15 10/17/2014 1004   LDLCALC 80 10/17/2014 1004         GERD (gastroesophageal reflux disease) Controlled, no change in medication    Diabetes type 2, controlled Controlled, no change in medication Patient advised to reduce carb and sweets, commit to regular physical activity, take meds as prescribed, test blood as directed, and attempt to lose weight, to improve blood sugar control.    Need for vaccination with 13-polyvalent pneumococcal conjugate vaccine After obtaining informed consent, the vaccine is  administered by LPN.    Acute cystitis with hematuria Abnormal UA, will hold on c/s prior to prescribing antibiotic

## 2015-03-01 MED ORDER — CIPROFLOXACIN HCL 500 MG PO TABS: 500.0000 mg | ORAL_TABLET | Freq: Two times a day (BID) | ORAL | Status: DC

## 2015-03-01 MED ORDER — FLUCONAZOLE 150 MG PO TABS: 150.0000 mg | ORAL_TABLET | Freq: Once | ORAL | Status: DC

## 2015-03-01 MED ORDER — ERGOCALCIFEROL 1.25 MG (50000 UT) PO CAPS: 50000.0000 [IU] | ORAL_CAPSULE | ORAL | Status: DC

## 2015-03-01 NOTE — Addendum Note (Signed)
Addended by: Kandis FantasiaSLADE, Payal Stanforth B on: 03/01/2015 08:40 AM   Modules accepted: Orders

## 2015-03-13 ENCOUNTER — Telehealth: Payer: Self-pay | Admitting: Family Medicine

## 2015-03-13 MED ORDER — PREDNISONE 5 MG PO TABS: 5.0000 mg | ORAL_TABLET | Freq: Two times a day (BID) | ORAL | Status: DC

## 2015-03-13 NOTE — Telephone Encounter (Signed)
Called patient and left message for them to return call at the office   

## 2015-03-13 NOTE — Telephone Encounter (Signed)
Patient will try the 3 days of prednisone but states she will call back if she still feels like she needs the inhaler sent in. Was having some coughing spells with chest tightness and wheeze. Will call back if she feels she still needs the inhaler

## 2015-03-13 NOTE — Telephone Encounter (Signed)
States she had wanted a refill of the proair for allergies she has had in the past at her visit? Is this ok?

## 2015-03-13 NOTE — Telephone Encounter (Signed)
Refill x1 only pls , also explain this is for wheezing and chest tightness associates with asthma primarily so should be used only for that as there are potential s/e on the heart If she is just having excess cough , 3 days of prednisone 5 mg twice daily safer/ better option and I recommend that if she has no dx of asthma, if she agrees to this approach do not send proair tell her to call back for it if she thinks she really will benefit from/ needs it

## 2015-03-13 NOTE — Addendum Note (Signed)
Addended by: Abner GreenspanHUDY, Rayquon Uselman H on: 03/13/2015 03:23 PM   Modules accepted: Orders

## 2015-03-22 ENCOUNTER — Encounter: Payer: Self-pay | Admitting: Family Medicine

## 2015-05-17 ENCOUNTER — Other Ambulatory Visit: Payer: Self-pay | Admitting: Family Medicine

## 2015-06-06 ENCOUNTER — Ambulatory Visit (INDEPENDENT_AMBULATORY_CARE_PROVIDER_SITE_OTHER): Payer: 59 | Admitting: Family Medicine

## 2015-06-06 ENCOUNTER — Encounter: Payer: Self-pay | Admitting: Family Medicine

## 2015-06-06 VITALS — BP 134/82 | HR 76 | Resp 18 | Ht 63.0 in | Wt 242.0 lb

## 2015-06-06 DIAGNOSIS — E119 Type 2 diabetes mellitus without complications: Secondary | ICD-10-CM | POA: Diagnosis not present

## 2015-06-06 DIAGNOSIS — Z1211 Encounter for screening for malignant neoplasm of colon: Secondary | ICD-10-CM

## 2015-06-06 DIAGNOSIS — J301 Allergic rhinitis due to pollen: Secondary | ICD-10-CM

## 2015-06-06 DIAGNOSIS — I10 Essential (primary) hypertension: Secondary | ICD-10-CM

## 2015-06-06 DIAGNOSIS — E559 Vitamin D deficiency, unspecified: Secondary | ICD-10-CM

## 2015-06-06 DIAGNOSIS — K219 Gastro-esophageal reflux disease without esophagitis: Secondary | ICD-10-CM

## 2015-06-06 LAB — COMPLETE METABOLIC PANEL WITH GFR
ALK PHOS: 82 U/L (ref 39–117)
ALT: 18 U/L (ref 0–35)
AST: 15 U/L (ref 0–37)
Albumin: 3.9 g/dL (ref 3.5–5.2)
BILIRUBIN TOTAL: 0.5 mg/dL (ref 0.2–1.2)
BUN: 12 mg/dL (ref 6–23)
CO2: 27 mEq/L (ref 19–32)
CREATININE: 0.8 mg/dL (ref 0.50–1.10)
Calcium: 9.5 mg/dL (ref 8.4–10.5)
Chloride: 104 mEq/L (ref 96–112)
GFR, Est African American: >89 mL/min
GFR, Est Non African American: 80 mL/min
Glucose, Bld: 111 mg/dL — ABNORMAL HIGH (ref 70–99)
Potassium: 3.9 mEq/L (ref 3.5–5.3)
SODIUM: 144 meq/L (ref 135–145)
TOTAL PROTEIN: 6.6 g/dL (ref 6.0–8.3)

## 2015-06-06 LAB — HEMOGLOBIN A1C
Hgb A1c MFr Bld: 6.9 % — ABNORMAL HIGH (ref <5.7)
Mean Plasma Glucose: 151 mg/dL — ABNORMAL HIGH (ref <117)

## 2015-06-06 LAB — LIPID PANEL
CHOLESTEROL: 139 mg/dL (ref 0–200)
HDL: 71 mg/dL (ref >=46)
LDL CALC: 53 mg/dL (ref 0–99)
Total CHOL/HDL Ratio: 2 Ratio
Triglycerides: 73 mg/dL (ref <150)
VLDL: 15 mg/dL (ref 0–40)

## 2015-06-06 NOTE — Assessment & Plan Note (Signed)
Updated lab needed at/ before next visit. [name] is reminded of the importance of commitment to daily physical activity for 30 minutes or more, as able and the need to limit carbohydrate intake to 30 to 60 grams per meal to help with blood sugar control.   The need to take medication as prescribed, test blood sugar as directed, and to call between visits if there is a concern that blood sugar is uncontrolled is also discussed.   [name] is reminded of the importance of daily foot exam, annual eye examination, and good blood sugar, blood pressure and cholesterol control.  Diabetic Labs Latest Ref Rng 02/14/2015 10/17/2014 06/07/2014 02/01/2014 11/07/2013  HbA1c <5.7 % 7.3(H) 6.8(H) 6.7(H) 6.4(H) -  Microalbumin <2.0 mg/dL 4.3(H) - - 0.50 -  Micro/Creat Ratio 0.0 - 30.0 mg/g 37.7(H) - - 4.6 -  Chol 0 - 200 mg/dL - 295160 621141 308198 -  HDL >65>39 mg/dL - 65 54 60 -  Calc LDL 0 - 99 mg/dL - 80 70 784(O117(H) -  Triglycerides <150 mg/dL - 76 86 962106 -  Creatinine 0.50 - 1.10 mg/dL 9.520.97 8.411.05 3.240.80 4.010.92 0.271.01   BP/Weight 06/06/2015 02/14/2015 10/18/2014 09/01/2014 06/07/2014 02/14/2014 02/02/2014  Systolic BP 134 124 120 139 122 143 124  Diastolic BP 82 82 82 89 82 90 86  Wt. (Lbs) 242 245 244.4 243.6 245.12 246.6 246  BMI 42.88 43.41 43.3 43.16 43.43 43.69 43.59   Foot/eye exam completion dates 02/14/2015 02/02/2014  Foot Form Completion Done Done

## 2015-06-06 NOTE — Progress Notes (Signed)
Tina RoyalsRosetta G Wilkins     MRN: [id number]      DOB: 01/28/1956   HPI Tina Wilkins is here for follow up and re-evaluation of chronic medical conditions, medication management and review of any available recent lab and radiology data.  Preventive health is updated, specifically  Cancer screening and Immunization.   Questions or concerns regarding consultations or procedures which the PT has had in the interim are  addressed. The PT denies any adverse reactions to current medications since the last visit.  Committed to daily exercise , enjoying this and doing well, interested in referral to nutritionist Denies polyuria, polydipsia, blurred vision , or hypoglycemic episodes.   ROS Denies recent fever or chills. Denies current  sinus pressure, nasal congestion, ear pain or sore throat.Had URI symptoms approx 3 weeks ago after getting wet during her brother's funeral, recovered with symptomatic meds Denies chest congestion, productive cough or wheezing. Denies chest pains, palpitations and leg swelling Denies abdominal pain, nausea, vomiting,diarrhea or constipation.   Denies dysuria, frequency, hesitancy or incontinence. Denies joint pain, swelling and limitation in mobility. Denies headaches, seizures, numbness, or tingling. Denies depression, anxiety or insomnia. Denies skin break down or rash.   PE  BP 134/82 mmHg  Pulse 76  Resp 18  Ht 5\' 3"  (1.6 m)  Wt 242 lb (109.77 kg)  BMI 42.88 kg/m2  SpO2 99%  Patient alert and oriented and in no cardiopulmonary distress.  HEENT: No facial asymmetry, EOMI,   oropharynx pink and moist.  Neck supple no JVD, no mass.  Chest: Clear to auscultation bilaterally.  CVS: S1, S2 no murmurs, no S3.Regular rate.  ABD: Soft non tender.   Ext: No edema  MS: Adequate ROM spine, shoulders, hips and knees.  Skin: Intact, no ulcerations or rash noted.  Psych: Good eye contact, normal affect. Memory intact not anxious or depressed appearing.  CNS:  CN 2-12 intact, power,  normal throughout.no focal deficits noted.   Assessment & Plan   Hypertension goal BP (blood pressure) < 130/80 Controlled, no change in medication DASH diet and commitment to daily physical activity for a minimum of 30 minutes discussed and encouraged, as a part of hypertension management. The importance of attaining a healthy weight is also discussed.  BP/Weight 06/06/2015 02/14/2015 10/18/2014 09/01/2014 06/07/2014 02/14/2014 02/02/2014  Systolic BP 134 124 120 139 122 143 124  Diastolic BP 82 82 82 89 82 90 86  Wt. (Lbs) 242 245 244.4 243.6 245.12 246.6 246  BMI 42.88 43.41 43.3 43.16 43.43 43.69 43.59        Diabetes type 2, controlled Updated lab needed at/ before next visit. Tina Wilkins is reminded of the importance of commitment to daily physical activity for 30 minutes or more, as able and the need to limit carbohydrate intake to 30 to 60 grams per meal to help with blood sugar control.   The need to take medication as prescribed, test blood sugar as directed, and to call between visits if there is a concern that blood sugar is uncontrolled is also discussed.   Tina Wilkins is reminded of the importance of daily foot exam, annual eye examination, and good blood sugar, blood pressure and cholesterol control.  Diabetic Labs Latest Ref Rng 02/14/2015 10/17/2014 06/07/2014 02/01/2014 11/07/2013  HbA1c <5.7 % 7.3(H) 6.8(H) 6.7(H) 6.4(H) -  Microalbumin <2.0 mg/dL 4.3(H) - - 0.50 -  Micro/Creat Ratio 0.0 - 30.0 mg/g 37.7(H) - - 4.6 -  Chol 0 - 200 mg/dL - 629160 528141  198 -  HDL >39 mg/dL - 65 54 60 -  Calc LDL 0 - 99 mg/dL - 80 70 161(W) -  Triglycerides <150 mg/dL - 76 86 960 -  Creatinine 0.50 - 1.10 mg/dL 4.54 0.98 1.19 1.47 8.29   BP/Weight 06/06/2015 02/14/2015 10/18/2014 09/01/2014 06/07/2014 02/14/2014 02/02/2014  Systolic BP 134 124 120 139 122 143 124  Diastolic BP 82 82 82 89 82 90 86  Wt. (Lbs) 242 245 244.4 243.6 245.12 246.6 246  BMI 42.88 43.41 43.3 43.16 43.43  43.69 43.59   Foot/eye exam completion dates 02/14/2015 02/02/2014  Foot Form Completion Done Done         Morbid obesity Slight improvement. Patient re-educated about  the importance of commitment to a  minimum of 150 minutes of exercise per week.  The importance of healthy food choices with portion control discussed. Encouraged to start a food diary, count calories and to consider  joining a support group. Sample diet sheets offered. Goals set by the patient for the next several months.   Weight /BMI 06/06/2015 02/14/2015 10/18/2014  WEIGHT 242 lb 245 lb 244 lb 6.4 oz  HEIGHT     BMI 42.88 kg/m2 43.41 kg/m2 43.3 kg/m2    Current exercise per week 150 minutes.   Vitamin D deficiency Updated lab needed at/ before next visit.   GERD (gastroesophageal reflux disease) Controlled, no change in medication   ALLERGIC RHINITIS, SEASONAL Currently controlled , had recent symptom flare now subsided

## 2015-06-06 NOTE — Assessment & Plan Note (Signed)
Updated lab needed at/ before next visit.   

## 2015-06-06 NOTE — Assessment & Plan Note (Signed)
Currently controlled , had recent symptom flare now subsided

## 2015-06-06 NOTE — Assessment & Plan Note (Signed)
Controlled, no change in medication DASH diet and commitment to daily physical activity for a minimum of 30 minutes discussed and encouraged, as a part of hypertension management. The importance of attaining a healthy weight is also discussed.  BP/Weight 06/06/2015 02/14/2015 10/18/2014 09/01/2014 06/07/2014 02/14/2014 02/02/2014  Systolic BP 134 124 120 139 122 143 124  Diastolic BP 82 82 82 89 82 90 86  Wt. (Lbs) 242 245 244.4 243.6 245.12 246.6 246  BMI 42.88 43.41 43.3 43.16 43.43 43.69 43.59

## 2015-06-06 NOTE — Assessment & Plan Note (Signed)
Slight improvement. Patient re-educated about  the importance of commitment to a  minimum of 150 minutes of exercise per week.  The importance of healthy food choices with portion control discussed. Encouraged to start a food diary, count calories and to consider  joining a support group. Sample diet sheets offered. Goals set by the patient for the next several months.   Weight /BMI 06/06/2015 02/14/2015 10/18/2014  WEIGHT 242 lb 245 lb 244 lb 6.4 oz  HEIGHT 5\' 3"  5\' 3"  5\' 3"   BMI 42.88 kg/m2 43.41 kg/m2 43.3 kg/m2    Current exercise per week 150 minutes.

## 2015-06-06 NOTE — Patient Instructions (Signed)
F/u in 4 month, call if you need me before   You will be contacted about labs  You are referred for eye exam and colonoscopy, please  Keep appointments  You are referred for diabetic education in [place]  Congrats on 2 pound weight loss, 15 pounds over the years  Keep taking 10,000 steps each  Week  Please work on good  health habits so that your health will improve. 1. Commitment to daily physical activity for 30 to 60  minutes, if you are able to do this.  2. Commitment to wise food choices. Aim for half of your  food intake to be vegetable and fruit, one quarter starchy foods, and one quarter protein. Try to eat on a regular schedule  3 meals per day, snacking between meals should be limited to vegetables or fruits or small portions of nuts. 64 ounces of water per day is generally recommended, unless you have specific health conditions, like heart failure or kidney failure where you will need to limit fluid intake.  3. Commitment to sufficient and a  good quality of physical and mental rest daily, generally between 6 to 8 hours per day.  WITH PERSISTANCE AND PERSEVERANCE, THE IMPOSSIBLE , BECOMES THE NORM!  [facility], we consider it a privelige to serve you.   Condolence on your recent loss

## 2015-06-06 NOTE — Assessment & Plan Note (Signed)
Controlled, no change in medication  

## 2015-06-07 ENCOUNTER — Encounter (INDEPENDENT_AMBULATORY_CARE_PROVIDER_SITE_OTHER): Payer: Self-pay | Admitting: *Deleted

## 2015-06-07 NOTE — Addendum Note (Signed)
Addended by: Kandis FantasiaSLADE, COURTNEY B on: 06/07/2015 01:30 PM   Modules accepted: Orders

## 2015-06-14 ENCOUNTER — Telehealth: Payer: Self-pay | Admitting: *Deleted

## 2015-06-14 NOTE — Telephone Encounter (Signed)
Pt called [facility] stating she was going to Dr. Karilyn Cotaehman to have a colonoscopy and her husband told her to go to Dr. Jeani HawkingPatrick Hung in [place] to do it, pt said that is where she wants to go. Dr. Haywood PaoHung's office number is [phone]

## 2015-06-15 ENCOUNTER — Other Ambulatory Visit: Payer: Self-pay | Admitting: Family Medicine

## 2015-06-15 DIAGNOSIS — Z1211 Encounter for screening for malignant neoplasm of colon: Secondary | ICD-10-CM

## 2015-06-15 NOTE — Telephone Encounter (Signed)
Have entered referral to GI of pt's choice, pls follow through per protocol

## 2015-06-15 NOTE — Telephone Encounter (Signed)
Noted  

## 2015-07-11 ENCOUNTER — Ambulatory Visit: Payer: 59 | Admitting: Nutrition

## 2015-07-25 ENCOUNTER — Encounter: Payer: 59 | Attending: Family Medicine | Admitting: Nutrition

## 2015-07-25 ENCOUNTER — Encounter: Payer: Self-pay | Admitting: Nutrition

## 2015-07-25 VITALS — Ht 62.0 in | Wt 237.0 lb

## 2015-07-25 DIAGNOSIS — Z713 Dietary counseling and surveillance: Secondary | ICD-10-CM | POA: Insufficient documentation

## 2015-07-25 DIAGNOSIS — IMO0002 Reserved for concepts with insufficient information to code with codable children: Secondary | ICD-10-CM

## 2015-07-25 DIAGNOSIS — E118 Type 2 diabetes mellitus with unspecified complications: Secondary | ICD-10-CM | POA: Diagnosis not present

## 2015-07-25 DIAGNOSIS — Z6841 Body Mass Index (BMI) 40.0 and over, adult: Secondary | ICD-10-CM | POA: Diagnosis not present

## 2015-07-25 DIAGNOSIS — E1165 Type 2 diabetes mellitus with hyperglycemia: Secondary | ICD-10-CM

## 2015-07-25 NOTE — Progress Notes (Signed)
  Medical Nutrition Therapy:  Appt start time: 1000 end time:  1100.  Assessment:  Primary concerns today: Diabetes Type 2 DM, Has had it for 2 years.Marland Kitchen LIves with her husband. Most foods are baked and has some fried. A1C 7.3%. A1C had been down near 6.5% in the past. Invokana  100 mg, Metformin 1000 mg BID.  Lost 20 pounds over the last year. Wants to lose more weight and get her BS under better control. Physical activity: walking. Has history of knee and back problems. Mows her  yard now for exercise. Testing blood sugars 97-111 mg/dl mostly. Only tests a few times per week now since he BS are much better. 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 Has cut out a lot of sodas recently. Diet is high in processed foods and inconsistent in CHO at certain times during the day. Says she is ready for lifestyle and behavioral changes.  Preferred Learning Style:   Auditory  Visual  Hands on    Learning Readiness:     Ready  Change in progress   MEDICATIONS: See list   DIETARY INTAKE:    24-hr recall:  B ( AM): Bacon, eggs-1 and toast 1 slice, coffee, creamer and splends Snk ( AM): none  L ( PM): Chicken nuggets 6,  or sandwich BBQ on wheat bread, with Orange soda or diet mt dew Snk ( PM): nothing D ( PM): Chicken nuggets, 6, waffle frys and lemonade 12 oz Snk ( PM): 2 glasses of water Beverages: water, diet soda, soda, lemonade   Usual physical activity: walking and now mowing her yard. Estimated energy needs: 1200 calories 135 g carbohydrates 90 g protein 33 g fat  Progress Towards Goal(s):  In progress.  Nutritional Diagnosis:  NB-1.1 Food and nutrition-related knowledge deficit As related to Diabetes.  As evidenced by A1C 7.5%.    Intervention:  Nutrition and diabetes education provided on CHO counting, meal planning, portion sizes, target ranges for blood sugars, treatment and s/s of hyper/hypoglycemia, benefits of exercise and weight loss and prevention of complications from  DM. Importance of foot, eye and dental care.  Goals:  1. Follow My Plate Method 2. Increase fresh fruits and vegetables and whole grains. 3. Cut out juices, lemonade, sodas and diet sodas. 4. Drink 5 bottles of water per day. 6. Eat three meals at B) 6-8 L)12-2 and Dinner 5-7 pm. 7. No snacks between meals. 8. Get A1C down to 6.5% in three months. 9. Lose 5 lbs by the next visit.  Teaching Method Utilized:  Visual Auditory Hands on  Handouts given during visit include:  The Plate Method  Meal Plan Card  Diabetes Instructions  Barriers to learning/adherence to lifestyle change: None  Demonstrated degree of understanding via:  Teach Back   Monitoring/Evaluation:  Dietary intake, exercise, meal planning, SBG, and body weight in 1 month(s).

## 2015-07-25 NOTE — Patient Instructions (Addendum)
Goals:  1. Follow My Plate Method 2. Increase fresh fruits and vegetables and whole grains. 3. Cut out juices, lemonade, sodas and diet sodas. 4. Drink 5 bottles of water per day. 6. Eat three meals at B) 6-8 L)12-2 and Dinner 5-7 pm. 7. No snacks between meals. 8. Get A1C down to 6.5% in three months. 9. Lose 5 lbs by the next visit.

## 2015-07-26 ENCOUNTER — Other Ambulatory Visit: Payer: Self-pay | Admitting: Family Medicine

## 2015-07-26 DIAGNOSIS — Z1231 Encounter for screening mammogram for malignant neoplasm of breast: Secondary | ICD-10-CM

## 2015-08-29 ENCOUNTER — Ambulatory Visit (HOSPITAL_COMMUNITY)
Admission: RE | Admit: 2015-08-29 | Discharge: 2015-08-29 | Disposition: A | Discharge status: Home / Self Care | Payer: 59 | Source: Ambulatory Visit | Attending: Family Medicine | Admitting: Family Medicine

## 2015-08-29 DIAGNOSIS — Z1231 Encounter for screening mammogram for malignant neoplasm of breast: Secondary | ICD-10-CM | POA: Diagnosis present

## 2015-09-05 ENCOUNTER — Encounter: Payer: Self-pay | Admitting: Nutrition

## 2015-09-05 ENCOUNTER — Encounter: Payer: 59 | Attending: Family Medicine | Admitting: Nutrition

## 2015-09-05 VITALS — Ht 62.25 in | Wt 245.0 lb

## 2015-09-05 DIAGNOSIS — Z6841 Body Mass Index (BMI) 40.0 and over, adult: Secondary | ICD-10-CM | POA: Insufficient documentation

## 2015-09-05 DIAGNOSIS — E119 Type 2 diabetes mellitus without complications: Secondary | ICD-10-CM | POA: Diagnosis present

## 2015-09-05 DIAGNOSIS — Z713 Dietary counseling and surveillance: Secondary | ICD-10-CM | POA: Insufficient documentation

## 2015-09-05 NOTE — Progress Notes (Signed)
.nd[facility] Medical Nutrition Therapy:  Appt start time: 1000 end time:  1100.  Assessment:  Primary concerns today: Diabetes Type 2 DM> A1C down from 7.3% down to 6.9%. Changes since last visit. Has cut out cakes, desserts, candy and ice cream. BS are much better. FBS 96 mg/dl this am..  BS much Better. Working on increasing steps.Her clothes are fitting looser now. Not testing her blood sugars.  Taking Metformin 2 g/d and Invokana daily. Drinks plenty of water but still has a little soda from time to time and occasional sweets. Eating more fresh fruits and vegetables. Wt is up 8 lbs since a month ago. She notes she only takes her fluid pill when she feels like she is swelling and doesn't take it on a regular basis because " it messes with my kidneys." she notes. Creatine WNL.. Lipid profile WNL.  Lab Results  Component Value Date   CREATININE 0.80 06/06/2015    Lab Results  Component Value Date   HGBA1C 6.9* 06/06/2015   Wt Readings from Last 3 Encounters:  09/05/15 245 lb (111.131 kg)  07/25/15 237 lb (107.502 kg)  06/06/15 242 lb (109.77 kg)   Ht Readings from Last 3 Encounters:  09/05/15 5' 2.25" (1.581 m)  07/25/15  (1.575 m)  06/06/15  (1.6 m)   Body mass index is 44.46 kg/(m^2).  Lab Results  Component Value Date   CHOL 139 06/06/2015   HDL 71 06/06/2015   LDLCALC 53 06/06/2015   TRIG 73 06/06/2015   CHOLHDL 2.0 06/06/2015    Preferred Learning Style:   Auditory  Visual  Hands on    Learning Readiness:     Ready  Change in progress   MEDICATIONS: See list   DIETARY INTAKE:    24-hr recall:  B ( AM):  Boiled egg and 1 slice bacon OR eggs and bacon and 1 slice toast. Snk ( AM): none  L ( PM): CHicken nuggets  Snk ( PM): nothing D ( PM): CHicken breast, green peas 1 cup, 1/2 c smashed potatoes and roll, Water and 1/2 soda regular. Snk ( PM): 2 glasses of water Beverages: water, diet soda, soda, lemonade   Usual physical activity: walking and  now mowing her yard. Estimated energy needs: 1200 calories 135 g carbohydrates 90 g protein 33 g fat  Progress Towards Goal(s):  In progress.  Nutritional Diagnosis:  NB-1.1 Food and nutrition-related knowledge deficit As related to Diabetes.  As evidenced by A1C 7.5%.    Intervention:  Nutrition and diabetes education provided on CHO counting, meal planning, portion sizes, target ranges for blood sugars, treatment and s/s of hyper/hypoglycemia, benefits of exercise and weight loss and prevention of complications from DM. Importance of foot, eye and dental care.  Goals:  1. Follow My Plate Method 2. Increase fresh fruits and vegetables and whole grains. 3. Increase fiber rich foods. 4. Drink 5 bottles of water per day. 6. Eat three meals at B) 6-8 L)12-2 and Dinner 5-7 pm. 7. No snacks between meals. 8. Get A1C down to 6.5% in three months. 9. Lose 5 lbs by the next visit. 10. Talk to MD about taking fluid pill on regular basis.  Use Mrs. Dash and avoid the salt substitutes.   Teaching Method Utilized:  Visual Auditory Hands on  Handouts given during visit include:  The Plate Method  Meal Plan Card  Diabetes Instructions  Barriers to learning/adherence to lifestyle change: None  Demonstrated degree of understanding via:  Teach  Back   Monitoring/Evaluation:  Dietary intake, exercise, meal planning, SBG, and body weight in 3 month(s). Consider taking fluid pill on regular schedule to help with fluctuations in weight that may be fluid related and following low sodium diet.

## 2015-09-05 NOTE — Patient Instructions (Signed)
Goals:  1. Follow My Plate Method 2. Increase fresh fruits and vegetables and whole grains. 3. Increase fiber rich foods. 4. Drink 5 bottles of water per day. 6. Eat three meals at B) 6-8 L)12-2 and Dinner 5-7 pm. 7. No snacks between meals. 8. Get A1C down to 6.5% in three months. 9. Lose 5 lbs by the next visit.

## 2015-09-06 ENCOUNTER — Other Ambulatory Visit: Payer: Self-pay

## 2015-09-06 ENCOUNTER — Other Ambulatory Visit: Payer: Self-pay | Admitting: Family Medicine

## 2015-09-06 MED ORDER — ROSUVASTATIN CALCIUM 20 MG PO TABS: 20.0000 mg | ORAL_TABLET | Freq: Every day | ORAL | Status: DC

## 2015-10-10 ENCOUNTER — Telehealth: Payer: Self-pay | Admitting: Family Medicine

## 2015-10-10 ENCOUNTER — Ambulatory Visit (INDEPENDENT_AMBULATORY_CARE_PROVIDER_SITE_OTHER): Payer: 59 | Admitting: Family Medicine

## 2015-10-10 ENCOUNTER — Encounter: Payer: Self-pay | Admitting: Family Medicine

## 2015-10-10 ENCOUNTER — Other Ambulatory Visit: Payer: Self-pay

## 2015-10-10 VITALS — BP 114/74 | HR 81 | Temp 97.9°F | Resp 16 | Ht 62.0 in | Wt 240.0 lb

## 2015-10-10 DIAGNOSIS — E118 Type 2 diabetes mellitus with unspecified complications: Secondary | ICD-10-CM

## 2015-10-10 DIAGNOSIS — J320 Chronic maxillary sinusitis: Secondary | ICD-10-CM | POA: Diagnosis not present

## 2015-10-10 DIAGNOSIS — I10 Essential (primary) hypertension: Secondary | ICD-10-CM

## 2015-10-10 DIAGNOSIS — M549 Dorsalgia, unspecified: Secondary | ICD-10-CM

## 2015-10-10 DIAGNOSIS — E559 Vitamin D deficiency, unspecified: Secondary | ICD-10-CM

## 2015-10-10 DIAGNOSIS — E785 Hyperlipidemia, unspecified: Secondary | ICD-10-CM

## 2015-10-10 LAB — COMPLETE METABOLIC PANEL WITH GFR
ALT: 19 U/L (ref 6–29)
AST: 15 U/L (ref 10–35)
Albumin: 4.2 g/dL (ref 3.6–5.1)
Alkaline Phosphatase: 93 U/L (ref 33–130)
BILIRUBIN TOTAL: 0.7 mg/dL (ref 0.2–1.2)
BUN: 17 mg/dL (ref 7–25)
CHLORIDE: 103 mmol/L (ref 98–110)
CO2: 29 mmol/L (ref 20–31)
Calcium: 9.8 mg/dL (ref 8.6–10.4)
Creat: 0.96 mg/dL (ref 0.50–0.99)
GFR, EST AFRICAN AMERICAN: 74 mL/min (ref >=60)
GFR, EST NON AFRICAN AMERICAN: 64 mL/min (ref >=60)
Glucose, Bld: 96 mg/dL (ref 65–99)
Potassium: 4.1 mmol/L (ref 3.5–5.3)
Sodium: 142 mmol/L (ref 135–146)
Total Protein: 7.1 g/dL (ref 6.1–8.1)

## 2015-10-10 LAB — HEMOGLOBIN A1C
HEMOGLOBIN A1C: 6.7 % — AB (ref <5.7)
MEAN PLASMA GLUCOSE: 146 mg/dL — AB (ref <117)

## 2015-10-10 MED ORDER — FLUCONAZOLE 150 MG PO TABS: ORAL_TABLET | ORAL | Status: DC

## 2015-10-10 MED ORDER — IBUPROFEN 800 MG PO TABS: 800.0000 mg | ORAL_TABLET | Freq: Three times a day (TID) | ORAL | Status: DC | PRN

## 2015-10-10 MED ORDER — LEVOFLOXACIN 750 MG PO TABS: 750.0000 mg | ORAL_TABLET | Freq: Every day | ORAL | Status: DC

## 2015-10-10 NOTE — Patient Instructions (Signed)
F/u last week in March, call if you need me before  Microalb, fasting lipid, cmp and eGFR, HBA1C, CBC, TSH, and vit D March 17 or after.   Congrats on lifestyle changes, and success  You are treated for acute left maxillary sinus infection, medication will be requested to be sent to [facility]  Please work on good  health habits so that your health will improve. 1. Commitment to daily physical activity for 30 to 60  minutes, if you are able to do this.  2. Commitment to wise food choices. Aim for half of your  food intake to be vegetable and fruit, one quarter starchy foods, and one quarter protein. Try to eat on a regular schedule  3 meals per day, snacking between meals should be limited to vegetables or fruits or small portions of nuts. 64 ounces of water per day is generally recommended, unless you have specific health conditions, like heart failure or kidney failure where you will need to limit fluid intake.  3. Commitment to sufficient and a  good quality of physical and mental rest daily, generally between 6 to 8 hours per day.  WITH PERSISTANCE AND PERSEVERANCE, THE IMPOSSIBLE , BECOMES THE NORM!  Thanks for choosing [facility], we consider it a privelige to serve you.

## 2015-10-10 NOTE — Telephone Encounter (Signed)
Noted that lab was able to pull order.

## 2015-10-10 NOTE — Telephone Encounter (Signed)
Patient is calling requesting lab orders be sent to the lab, please advise?

## 2015-10-10 NOTE — Assessment & Plan Note (Addendum)
2 week h/o left facial pressure, antibiotic prescribed

## 2015-10-12 NOTE — Assessment & Plan Note (Signed)
Controlled, and improved, no change in medication Tina Wilkins is reminded of the importance of commitment to daily physical activity for 30 minutes or more, as able and the need to limit carbohydrate intake to 30 to 60 grams per meal to help with blood sugar control.   The need to take medication as prescribed, test blood sugar as directed, and to call between visits if there is a concern that blood sugar is uncontrolled is also discussed.   Tina Wilkins is reminded of the importance of daily foot exam, annual eye examination, and good blood sugar, blood pressure and cholesterol control.  Diabetic Labs Latest Ref Rng 10/10/2015 06/06/2015 02/14/2015 10/17/2014 06/07/2014  HbA1c <5.7 % 6.7(H) 6.9(H) 7.3(H) 6.8(H) 6.7(H)  Microalbumin <2.0 mg/dL - - 4.3(H) - -  Micro/Creat Ratio 0.0 - 30.0 mg/g - - 37.7(H) - -  Chol 0 - 200 mg/dL - 161139 - 096160 045141  HDL >=40>=46 mg/dL - 71 - 65 54  Calc LDL 0 - 99 mg/dL - 53 - 80 70  Triglycerides <150 mg/dL - 73 - 76 86  Creatinine 0.50 - 0.99 mg/dL 9.810.96 1.910.80 4.780.97 2.951.05 6.210.80   BP/Weight 10/10/2015 09/05/2015 07/25/2015 06/06/2015 02/14/2015 10/18/2014 09/01/2014  Systolic BP 114 - - 134 124 120 139  Diastolic BP 74 - - 82 82 82 89  Wt. (Lbs) 240 245 237 242 245 244.4 243.6  BMI 43.89 44.46 43.34 42.88 43.41 43.3 43.16   Foot/eye exam completion dates 02/14/2015 02/02/2014  Foot Form Completion Done Done

## 2015-10-12 NOTE — Assessment & Plan Note (Signed)
Deteriorated. Patient re-educated about  the importance of commitment to a  minimum of 150 minutes of exercise per week.  The importance of healthy food choices with portion control discussed. Encouraged to start a food diary, count calories and to consider  joining a support group. Sample diet sheets offered. Goals set by the patient for the next several months.   Weight /BMI 10/10/2015 09/05/2015 07/25/2015  WEIGHT 240 lb 245 lb 237 lb  HEIGHT 5\' 2"  5' 2.25" 5\' 2"   BMI 43.89 kg/m2 44.46 kg/m2 43.34 kg/m2    Current exercise per week 150 minutes.

## 2015-10-12 NOTE — Progress Notes (Signed)
Subjective:    Patient ID: Tina Wilkins, female    DOB: Jan 24, 1956, 59 y.o.   MRN: [id number]  HPI   Tina Wilkins     MRN: [id number]      DOB: 22-Aug-1956   HPI Tina Wilkins is here for follow up and re-evaluation of chronic medical conditions, medication management and review of any available recent lab and radiology data.  Preventive health is updated, specifically  Cancer screening and Immunization.   Questions or concerns regarding consultations or procedures which the PT has had in the interim are  addressed. The PT denies any adverse reactions to current medications since the last visit.  2 week h/o left maxillary pressure and green nasal drainage, intermittent chills  ROS . Denies chest congestion, productive cough or wheezing. Denies chest pains, palpitations and leg swelling Denies abdominal pain, nausea, vomiting,diarrhea or constipation.   Denies dysuria, frequency, hesitancy or incontinence. Denies joint pain, swelling and limitation in mobility. Denies headaches, seizures, numbness, or tingling. Denies depression, anxiety or insomnia. Denies skin break down or rash.   PE  BP 114/74 mmHg  Pulse 81  Temp(Src) 97.9 F (36.6 C) (Oral)  Resp 16  Ht  (1.575 m)  Wt 240 lb (108.863 kg)  BMI 43.89 kg/m2  SpO2 98%  Patient alert and oriented and in no cardiopulmonary distress.  HEENT: No facial asymmetry, EOMI,   oropharynx pink and moist.  Neck supple no JVD, no mass. Left maxillary tenderness, TM clear Chest: Clear to auscultation bilaterally.  CVS: S1, S2 no murmurs, no S3.Regular rate.  ABD: Soft non tender.   Ext: No edema  MS: Adequate ROM spine, shoulders, hips and knees.  Skin: Intact, no ulcerations or rash noted.  Psych: Good eye contact, normal affect. Memory intact not anxious or depressed appearing.  CNS: CN 2-12 intact, power,  normal throughout.no focal deficits noted.   Assessment & Plan   Left maxillary sinusitis 2 week  h/o left facial pressure, antibiotic prescribed  Hypertension goal BP (blood pressure) < 130/80 Controlled, no change in medication DASH diet and commitment to daily physical activity for a minimum of 30 minutes discussed and encouraged, as a part of hypertension management. The importance of attaining a healthy weight is also discussed.  BP/Weight 10/10/2015 09/05/2015 07/25/2015 06/06/2015 02/14/2015 10/18/2014 09/01/2014  Systolic BP 114 - - 134 124 120 139  Diastolic BP 74 - - 82 82 82 89  Wt. (Lbs) 240 245 237 242 245 244.4 243.6  BMI 43.89 44.46 43.34 42.88 43.41 43.3 43.16        Diabetes type 2, controlled Controlled, and improved, no change in medication Tina Wilkins is reminded of the importance of commitment to daily physical activity for 30 minutes or more, as able and the need to limit carbohydrate intake to 30 to 60 grams per meal to help with blood sugar control.   The need to take medication as prescribed, test blood sugar as directed, and to call between visits if there is a concern that blood sugar is uncontrolled is also discussed.   Tina Wilkins is reminded of the importance of daily foot exam, annual eye examination, and good blood sugar, blood pressure and cholesterol control.  Diabetic Labs Latest Ref Rng 10/10/2015 06/06/2015 02/14/2015 10/17/2014 06/07/2014  HbA1c <5.7 % 6.7(H) 6.9(H) 7.3(H) 6.8(H) 6.7(H)  Microalbumin <2.0 mg/dL - - 4.3(H) - -  Micro/Creat Ratio 0.0 - 30.0 mg/g - - 37.7(H) - -  Chol 0 - 200 mg/dL -  139 - 160 141  HDL >=46 mg/dL - 71 - 65 54  Calc LDL 0 - 99 mg/dL - 53 - 80 70  Triglycerides <150 mg/dL - 73 - 76 86  Creatinine 0.50 - 0.99 mg/dL 1.610.96 0.960.80 0.450.97 4.091.05 8.110.80   BP/Weight 10/10/2015 09/05/2015 07/25/2015 06/06/2015 02/14/2015 10/18/2014 09/01/2014  Systolic BP 114 - - 134 124 120 139  Diastolic BP 74 - - 82 82 82 89  Wt. (Lbs) 240 245 237 242 245 244.4 243.6  BMI 43.89 44.46 43.34 42.88 43.41 43.3 43.16   Foot/eye exam completion dates 02/14/2015  02/02/2014  Foot Form Completion Done Done         Morbid obesity Deteriorated. Patient re-educated about  the importance of commitment to a  minimum of 150 minutes of exercise per week.  The importance of healthy food choices with portion control discussed. Encouraged to start a food diary, count calories and to consider  joining a support group. Sample diet sheets offered. Goals set by the patient for the next several months.   Weight /BMI 10/10/2015 09/05/2015 07/25/2015  WEIGHT 240 lb 245 lb 237 lb  HEIGHT 5\' 2"  5' 2.25" 5\' 2"   BMI 43.89 kg/m2 44.46 kg/m2 43.34 kg/m2    Current exercise per week 150 minutes.   Hyperlipidemia Controlled, no change in medication Hyperlipidemia:Low fat diet discussed and encouraged.   Lipid Panel  Lab Results  Component Value Date   CHOL 139 06/06/2015   HDL 71 06/06/2015   LDLCALC 53 06/06/2015   TRIG 73 06/06/2015   CHOLHDL 2.0 06/06/2015      Updated lab needed at/ before next visit.        Review of Systems     Objective:   Physical Exam        Assessment & Plan:

## 2015-10-12 NOTE — Assessment & Plan Note (Signed)
Controlled, no change in medication Hyperlipidemia:Low fat diet discussed and encouraged.   Lipid Panel  Lab Results  Component Value Date   CHOL 139 06/06/2015   HDL 71 06/06/2015   LDLCALC 53 06/06/2015   TRIG 73 06/06/2015   CHOLHDL 2.0 06/06/2015      Updated lab needed at/ before next visit.

## 2015-10-12 NOTE — Assessment & Plan Note (Signed)
Controlled, no change in medication DASH diet and commitment to daily physical activity for a minimum of 30 minutes discussed and encouraged, as a part of hypertension management. The importance of attaining a healthy weight is also discussed.  BP/Weight 10/10/2015 09/05/2015 07/25/2015 06/06/2015 02/14/2015 10/18/2014 09/01/2014  Systolic BP 114 - - 134 124 120 139  Diastolic BP 74 - - 82 82 82 89  Wt. (Lbs) 240 245 237 242 245 244.4 243.6  BMI 43.89 44.46 43.34 42.88 43.41 43.3 43.16

## 2015-11-29 ENCOUNTER — Other Ambulatory Visit: Payer: Self-pay | Admitting: Family Medicine

## 2015-12-04 ENCOUNTER — Telehealth: Payer: Self-pay | Admitting: Family Medicine

## 2015-12-04 NOTE — Telephone Encounter (Signed)
Pharmacy is checking on medication refill on Furosemide faxed on 11/28/15 , please advise?

## 2015-12-04 NOTE — Telephone Encounter (Signed)
clarifed that she no longer took lasix

## 2015-12-04 NOTE — Telephone Encounter (Signed)
Called and spoke to pharmacist

## 2015-12-05 ENCOUNTER — Encounter: Payer: 59 | Attending: Family Medicine | Admitting: Nutrition

## 2015-12-05 ENCOUNTER — Encounter: Payer: Self-pay | Admitting: Nutrition

## 2015-12-05 VITALS — Ht 62.0 in | Wt 246.7 lb

## 2015-12-05 DIAGNOSIS — Z6841 Body Mass Index (BMI) 40.0 and over, adult: Secondary | ICD-10-CM | POA: Insufficient documentation

## 2015-12-05 DIAGNOSIS — E1121 Type 2 diabetes mellitus with diabetic nephropathy: Secondary | ICD-10-CM

## 2015-12-05 DIAGNOSIS — E119 Type 2 diabetes mellitus without complications: Secondary | ICD-10-CM | POA: Insufficient documentation

## 2015-12-05 DIAGNOSIS — Z713 Dietary counseling and surveillance: Secondary | ICD-10-CM | POA: Diagnosis not present

## 2015-12-05 NOTE — Progress Notes (Signed)
.[facility] Medical Nutrition Therapy:  Appt start time: 1000 end time:  1100.  Assessment:  Primary concerns today: Diabetes Type 2 DM> A1C 6.7% down from 6.9%..  Drinking more water and unsweet tea. Cut out diet sodas. FBS 100-120's . Exercise; walking a lot more. Feels better.  Physical actvity: walking and trying to do some sit ups. She notes she didn't exercise much this past month being off work. Wiling to get back into watching what she is eating and exercising more.   Hasn't needed to take her fluid pills as much recently. Has cut down on a lot of processed food. A1C better. Need to focus on cutting extra calories and losing weight. She is on Metformin 1000 mg BID and Invokana. Testing blood sugars in am and occasionally at night.  Lab Results  Component Value Date   CREATININE 0.96 10/10/2015    Lab Results  Component Value Date   HGBA1C 6.7* 10/10/2015   Wt Readings from Last 3 Encounters:  12/05/15 246 lb 11.2 oz (111.902 kg)  10/10/15 240 lb (108.863 kg)  09/05/15 245 lb (111.131 kg)   Ht Readings from Last 3 Encounters:  12/05/15 5\' 2"  (1.575 m)  10/10/15 5\' 2"  (1.575 m)  09/05/15 5' 2.25" (1.581 m)   Body mass index is 45.11 kg/(m^2).  Lab Results  Component Value Date   CHOL 139 06/06/2015   HDL 71 06/06/2015   LDLCALC 53 06/06/2015   TRIG 73 06/06/2015   CHOLHDL 2.0 06/06/2015    Preferred Learning Style:   Auditory  Visual  Hands on    Learning Readiness:     Ready  Change in progress   MEDICATIONS: See list   DIETARY INTAKE:    24-hr recall:  B ( AM):  Boiled egg and 1 slice cheese 1 slice ww toast and 1 bowl of oatmeal  Snk ( AM): none  L ( PM): 1/4 pototato, string beans, turnip greens, cornbread, ham 1 slice,  Water D ( PM): Chicken noodle soup 2 cup and 8 crackers, water, sf lemonade Snk ( PM):  Beverages: water, lemonade   Usual physical activity: walking and now mowing her yard. Estimated energy needs: 1200 calories 135 g  carbohydrates 90 g protein 33 g fat  Progress Towards Goal(s):  In progress.  Nutritional Diagnosis:  NB-1.1 Food and nutrition-related knowledge deficit As related to Diabetes.  As evidenced by A1C 7.5%.    Intervention:  Nutrition and diabetes education provided on CHO counting, meal planning, portion sizes, target ranges for blood sugars, treatment and s/s of hyper/hypoglycemia, benefits of exercise and weight loss and prevention of complications from DM. Importance of foot, eye and dental care. Metformin 100 BID and   Goals: Keep up the good work!!!  1. Follow My Plate Method 2. Increase fresh fruits and vegetables and whole grains. 3. Try prunes as  Needed for bowels. 4. Drink 5 bottles of water per day. 6. Eat three meals at B) 6-8 L)12-2 and Dinner 5-7 pm. 7. No snacks between meals. 8. Get A1C down to 6.5% in three months. 9. Lose 5 lbs by the next visit. 10. Use Mrs. Dash and avoid the salt substitutes.   Teaching Method Utilized:  Visual Auditory Hands on  Handouts given during visit include:  The Plate Method  Meal Plan Card  Diabetes Instructions  Barriers to learning/adherence to lifestyle change: None  Demonstrated degree of understanding via:  Teach Back   Monitoring/Evaluation:  Dietary intake, exercise, meal planning, SBG, and  body weight in 3 month(s).

## 2015-12-05 NOTE — Patient Instructions (Signed)
  Goals: Keep up the good work!!!  1. Follow My Plate Method 2. Increase fresh fruits and vegetables and whole grains. 3. Try prunes as  Needed for bowels. 4. Drink 5 bottles of water per day. 6. Eat three meals at B) 6-8 L)12-2 and Dinner 5-7 pm. 7. No snacks between meals. 8. Get A1C down to 6.5% in three months. 9. Lose 5 lbs by the next visit. 10. Use Mrs. Dash and avoid the salt substitutes.

## 2016-02-08 ENCOUNTER — Other Ambulatory Visit: Payer: Self-pay | Admitting: Family Medicine

## 2016-02-08 MED FILL — METHOCARBAMOL 500 MG TABLET: 500 | 90 days supply | Qty: 90 | Fill #0

## 2016-02-27 ENCOUNTER — Encounter: Payer: Self-pay | Admitting: Family Medicine

## 2016-02-27 ENCOUNTER — Ambulatory Visit (INDEPENDENT_AMBULATORY_CARE_PROVIDER_SITE_OTHER): Payer: 59 | Admitting: Family Medicine

## 2016-02-27 ENCOUNTER — Other Ambulatory Visit: Payer: Self-pay | Admitting: Family Medicine

## 2016-02-27 ENCOUNTER — Other Ambulatory Visit: Payer: Self-pay

## 2016-02-27 VITALS — BP 130/82 | HR 80 | Resp 16 | Ht 62.0 in | Wt 248.0 lb

## 2016-02-27 DIAGNOSIS — E669 Obesity, unspecified: Secondary | ICD-10-CM

## 2016-02-27 DIAGNOSIS — E1169 Type 2 diabetes mellitus with other specified complication: Secondary | ICD-10-CM

## 2016-02-27 DIAGNOSIS — Z1159 Encounter for screening for other viral diseases: Secondary | ICD-10-CM | POA: Diagnosis not present

## 2016-02-27 DIAGNOSIS — R079 Chest pain, unspecified: Secondary | ICD-10-CM

## 2016-02-27 DIAGNOSIS — E785 Hyperlipidemia, unspecified: Secondary | ICD-10-CM | POA: Diagnosis not present

## 2016-02-27 DIAGNOSIS — E559 Vitamin D deficiency, unspecified: Secondary | ICD-10-CM | POA: Diagnosis not present

## 2016-02-27 DIAGNOSIS — I10 Essential (primary) hypertension: Secondary | ICD-10-CM

## 2016-02-27 DIAGNOSIS — R9431 Abnormal electrocardiogram [ECG] [EKG]: Secondary | ICD-10-CM | POA: Diagnosis not present

## 2016-02-27 DIAGNOSIS — E118 Type 2 diabetes mellitus with unspecified complications: Secondary | ICD-10-CM | POA: Diagnosis not present

## 2016-02-27 DIAGNOSIS — E119 Type 2 diabetes mellitus without complications: Secondary | ICD-10-CM

## 2016-02-27 LAB — CBC WITH DIFFERENTIAL/PLATELET
BASOS ABS: 0.1 10*3/uL (ref 0.0–0.1)
Basophils Relative: 1 % (ref 0–1)
EOS ABS: 0.3 10*3/uL (ref 0.0–0.7)
Eosinophils Relative: 4 % (ref 0–5)
HCT: 39.7 % (ref 36.0–46.0)
Hemoglobin: 13 g/dL (ref 12.0–15.0)
LYMPHS ABS: 2.4 10*3/uL (ref 0.7–4.0)
Lymphocytes Relative: 37 % (ref 12–46)
MCH: 27.6 pg (ref 26.0–34.0)
MCHC: 32.7 g/dL (ref 30.0–36.0)
MCV: 84.3 fL (ref 78.0–100.0)
MPV: 9.9 fL (ref 8.6–12.4)
Monocytes Absolute: 0.4 10*3/uL (ref 0.1–1.0)
Monocytes Relative: 6 % (ref 3–12)
NEUTROS PCT: 52 % (ref 43–77)
Neutro Abs: 3.4 10*3/uL (ref 1.7–7.7)
Platelets: 406 10*3/uL — ABNORMAL HIGH (ref 150–400)
RBC: 4.71 MIL/uL (ref 3.87–5.11)
RDW: 15 % (ref 11.5–15.5)
WBC: 6.6 10*3/uL (ref 4.0–10.5)

## 2016-02-27 MED ORDER — ROSUVASTATIN CALCIUM 20 MG PO TABS
20.0000 mg | ORAL_TABLET | Freq: Every day | ORAL | Status: DC
Start: 2016-02-27 — End: 2016-08-12

## 2016-02-27 MED ORDER — TRIAMTERENE-HCTZ 37.5-25 MG PO TABS: 1.0000 | ORAL_TABLET | Freq: Every day | ORAL | Status: DC

## 2016-02-27 MED ORDER — CLONIDINE HCL 0.3 MG PO TABS: 0.3000 mg | ORAL_TABLET | Freq: Every day | ORAL | Status: DC

## 2016-02-27 MED ORDER — PANTOPRAZOLE SODIUM 40 MG PO TBEC: 40.0000 mg | DELAYED_RELEASE_TABLET | Freq: Every day | ORAL | Status: DC

## 2016-02-27 MED ORDER — AMLODIPINE BESYLATE 10 MG PO TABS: ORAL_TABLET | ORAL | Status: DC

## 2016-02-27 MED ORDER — CANAGLIFLOZIN 300 MG PO TABS: ORAL_TABLET | ORAL | Status: DC

## 2016-02-27 MED ORDER — BENAZEPRIL HCL 40 MG PO TABS: 40.0000 mg | ORAL_TABLET | Freq: Every day | ORAL | Status: DC

## 2016-02-27 MED FILL — cloNIDine HCL 0.3 MG TABS: 0.3 | 90 days supply | Qty: 90 | Fill #0

## 2016-02-27 MED FILL — INVOKANA 300 MG TABLET: 300 | 90 days supply | Qty: 90 | Fill #0

## 2016-02-27 MED FILL — ROSUVASTATIN CALCIUM 20 MG: 20 | 90 days supply | Qty: 90 | Fill #0

## 2016-02-27 MED FILL — PANTOPRAZOLE SOD DR 40 MG T: 40 | 90 days supply | Qty: 90 | Fill #0

## 2016-02-27 MED FILL — AMLODIPINE BESYLATE 10 MG T: 10 | 90 days supply | Qty: 90 | Fill #0

## 2016-02-27 MED FILL — BENAZEPRIL HCL 40 MG TABLET: 40 | 90 days supply | Qty: 90 | Fill #0

## 2016-02-27 MED FILL — TRIAMTERENE/HCTZ 37.5/25 TB: 37.5-25 | 90 days supply | Qty: 90 | Fill #0

## 2016-02-27 NOTE — Progress Notes (Signed)
Subjective:    Patient ID: Tina Wilkins, female    DOB: 1956-07-08, 60 y.o.   MRN: [id number]  HPI   Tina Wilkins     MRN: [id number]      DOB: Jul 30, 1956   HPI Tina Wilkins is here for follow up and re-evaluation of chronic medical conditions, medication management and review of any available recent lab and radiology data.  Preventive health is updated, specifically  Cancer screening and Immunization.   Questions or concerns regarding consultations or procedures which the PT has had in the interim are  addressed. The PT denies any adverse reactions to current medications since the last visit.  1 month h/o intermittent chest pain, no specific aggravating or relieving factors noted, no associated light headedness or diaphoresis , or nausea Pt states that her father  Had CAD in his 22's , she is diabetic , and also has HTN and hyperlipidemia, therefore she is at increased risk for CAD and needs further evaluation by cardiology  Denies polyuria, polydipsia, blurred vision , or hypoglycemic episodes.   ROS Denies recent fever or chills. Denies sinus pressure, nasal congestion, ear pain or sore throat. Denies chest congestion, productive cough or wheezing. Denies PND , orthopnea, palpitations and leg swelling Denies abdominal pain, nausea, vomiting,diarrhea or constipation.   Denies dysuria, frequency, hesitancy or incontinence. Denies joint pain, swelling and limitation in mobility. Denies headaches, seizures, numbness, or tingling. Denies depression, anxiety or insomnia. Denies skin break down or rash.   PE  BP 130/82 mmHg  Pulse 80  Resp 16  Ht  (1.575 m)  Wt 248 lb (112.492 kg)  BMI 45.35 kg/m2  SpO2 96%  Patient alert and oriented and in no cardiopulmonary distress.  HEENT: No facial asymmetry, EOMI,   oropharynx pink and moist.  Neck supple no JVD, no mass.  Chest: Clear to auscultation bilaterally.No reproducible chest wall pain  CVS: S1, S2 no murmurs, no  S3.Regular rate. EKG: first degree AV block, sinus arrythmia and left ant fascicular block ABD: Soft non tender.   Ext: No edema  MS: Adequate ROM spine, shoulders, hips and knees.  Skin: Intact, no ulcerations or rash noted.  Psych: Good eye contact, normal affect. Memory intact not anxious or depressed appearing.  CNS: CN 2-12 intact, power,  normal throughout.no focal deficits noted.   Assessment & Plan   Hypertension goal BP (blood pressure) < 130/80 Controlled, no change in medication DASH diet and commitment to daily physical activity for a minimum of 30 minutes discussed and encouraged, as a part of hypertension management. The importance of attaining a healthy weight is also discussed.  BP/Weight 02/27/2016 12/05/2015 10/10/2015 09/05/2015 07/25/2015 06/06/2015 02/14/2015  Systolic BP 130 - 114 - - 134 782  Diastolic BP 82 - 74 - - 82 82  Wt. (Lbs) 248 246.7 240 245 237 242 245  BMI 45.35 45.11 43.89 44.46 43.34 42.88 43.41        Diabetes mellitus type 2 in obese [facility]) Tina Wilkins is reminded of the importance of commitment to daily physical activity for 30 minutes or more, as able and the need to limit carbohydrate intake to 30 to 60 grams per meal to help with blood sugar control.   The need to take medication as prescribed, test blood sugar as directed, and to call between visits if there is a concern that blood sugar is uncontrolled is also discussed.   Tina Wilkins is reminded of the importance of daily foot exam,  annual eye examination, and good blood sugar, blood pressure and cholesterol control. Deteriorated  Diabetic Labs Latest Ref Rng 02/27/2016 10/10/2015 06/06/2015 02/14/2015 10/17/2014  HbA1c <5.7 % 7.1(H) 6.7(H) 6.9(H) 7.3(H) 6.8(H)  Microalbumin Not estab mg/dL 1.3 - - 4.3(H) -  Micro/Creat Ratio <30 mcg/mg creat 7 - - 37.7(H) -  Chol 125 - 200 mg/dL 782162 - 956139 - 213160  HDL >=08>=46 mg/dL 76 - 71 - 65  Calc LDL <130 mg/dL 74 - 53 - 80  Triglycerides <150 mg/dL 58 -  73 - 76  Creatinine 0.50 - 0.99 mg/dL 6.570.78 8.460.96 9.620.80 9.520.97 8.411.05   BP/Weight 02/27/2016 12/05/2015 10/10/2015 09/05/2015 07/25/2015 06/06/2015 02/14/2015  Systolic BP 130 - 114 - - 134 324124  Diastolic BP 82 - 74 - - 82 82  Wt. (Lbs) 248 246.7 240 245 237 242 245  BMI 45.35 45.11 43.89 44.46 43.34 42.88 43.41   Foot/eye exam completion dates 02/14/2015 02/02/2014  Foot Form Completion Done Done         Hyperlipidemia Hyperlipidemia:Low fat diet discussed and encouraged.   Lipid Panel  Lab Results  Component Value Date   CHOL 162 02/27/2016   HDL 76 02/27/2016   LDLCALC 74 02/27/2016   TRIG 58 02/27/2016   CHOLHDL 2.1 02/27/2016      Controlled, no change in medication   Morbid obesity Deteriorated. Patient re-educated about  the importance of commitment to a  minimum of 150 minutes of exercise per week.  The importance of healthy food choices with portion control discussed. Encouraged to start a food diary, count calories and to consider  joining a support group. Sample diet sheets offered. Goals set by the patient for the next several months.   Weight /BMI 02/27/2016 12/05/2015 10/10/2015  WEIGHT 248 lb 246 lb 11.2 oz 240 lb  HEIGHT 5\' 2"  5\' 2"  5\' 2"   BMI 45.35 kg/m2 45.11 kg/m2 43.89 kg/m2    Current exercise per week 90 minutes.   Chest pain with moderate risk for cardiac etiology C/o intermittent chest pain in apst month. Multiple CV risk factors, as above Office EKG: abnormal; first degree AV block, left ant fascicular block, needs cardiology eval      Review of Systems     Objective:   Physical Exam        Assessment & Plan:

## 2016-02-27 NOTE — Patient Instructions (Addendum)
Physical exam in  3 months (end June) call if you need me before  EKG today and you are referred for cardiology evaluation, due to Dad having CAd in his 2650's and also because you are diabetic  Labs today  Please work on good  health habits so that your health will improve. 1. Commitment to daily physical activity for 30 to 60  minutes, if you are able to do this.  2. Commitment to wise food choices. Aim for half of your  food intake to be vegetable and fruit, one quarter starchy foods, and one quarter protein. Try to eat on a regular schedule  3 meals per day, snacking between meals should be limited to vegetables or fruits or small portions of nuts. 64 ounces of water per day is generally recommended, unless you have specific health conditions, like heart failure or kidney failure where you will need to limit fluid intake.  3. Commitment to sufficient and a  good quality of physical and mental rest daily, generally between 6 to 8 hours per day.  WITH PERSISTANCE AND PERSEVERANCE, THE IMPOSSIBLE , BECOMES THE NORM!

## 2016-02-28 LAB — LIPID PANEL
CHOL/HDL RATIO: 2.1 ratio (ref <=5.0)
Cholesterol: 162 mg/dL (ref 125–200)
HDL: 76 mg/dL (ref >=46)
LDL Cholesterol: 74 mg/dL (ref <130)
Triglycerides: 58 mg/dL (ref <150)
VLDL: 12 mg/dL (ref <30)

## 2016-02-28 LAB — COMPLETE METABOLIC PANEL WITH GFR
ALBUMIN: 4.5 g/dL (ref 3.6–5.1)
ALT: 21 U/L (ref 6–29)
AST: 15 U/L (ref 10–35)
Alkaline Phosphatase: 90 U/L (ref 33–130)
BUN: 10 mg/dL (ref 7–25)
CO2: 26 mmol/L (ref 20–31)
Calcium: 10.2 mg/dL (ref 8.6–10.4)
Chloride: 105 mmol/L (ref 98–110)
Creat: 0.78 mg/dL (ref 0.50–0.99)
GFR, Est African American: >89 mL/min (ref >=60)
GFR, Est Non African American: 82 mL/min (ref >=60)
GLUCOSE: 108 mg/dL — AB (ref 65–99)
POTASSIUM: 4.2 mmol/L (ref 3.5–5.3)
SODIUM: 143 mmol/L (ref 135–146)
TOTAL PROTEIN: 7.6 g/dL (ref 6.1–8.1)
Total Bilirubin: 0.8 mg/dL (ref 0.2–1.2)

## 2016-02-28 LAB — MICROALBUMIN / CREATININE URINE RATIO
Creatinine, Urine: 176 mg/dL (ref 20–320)
MICROALB/CREAT RATIO: 7 ug/mg{creat} (ref <30)
Microalb, Ur: 1.3 mg/dL

## 2016-02-28 LAB — HEMOGLOBIN A1C
HEMOGLOBIN A1C: 7.1 % — AB (ref <5.7)
Mean Plasma Glucose: 157 mg/dL

## 2016-02-28 LAB — TSH: TSH: 1.92 mIU/L

## 2016-02-28 LAB — HEPATITIS C ANTIBODY: HCV AB: NEGATIVE

## 2016-02-28 LAB — VITAMIN D 25 HYDROXY (VIT D DEFICIENCY, FRACTURES): Vit D, 25-Hydroxy: 38 ng/mL (ref 30–100)

## 2016-03-02 DIAGNOSIS — R079 Chest pain, unspecified: Secondary | ICD-10-CM | POA: Insufficient documentation

## 2016-03-02 NOTE — Assessment & Plan Note (Signed)
Deteriorated. Patient re-educated about  the importance of commitment to a  minimum of 150 minutes of exercise per week.  The importance of healthy food choices with portion control discussed. Encouraged to start a food diary, count calories and to consider  joining a support group. Sample diet sheets offered. Goals set by the patient for the next several months.   Weight /BMI 02/27/2016 12/05/2015 10/10/2015  WEIGHT 248 lb 246 lb 11.2 oz 240 lb  HEIGHT 5\' 2"  5\' 2"  5\' 2"   BMI 45.35 kg/m2 45.11 kg/m2 43.89 kg/m2    Current exercise per week 90 minutes.

## 2016-03-02 NOTE — Assessment & Plan Note (Signed)
C/o intermittent chest pain in apst month. Multiple CV risk factors, as above Office EKG: abnormal; first degree AV block, left ant fascicular block, needs cardiology eval

## 2016-03-02 NOTE — Assessment & Plan Note (Signed)
Controlled, no change in medication DASH diet and commitment to daily physical activity for a minimum of 30 minutes discussed and encouraged, as a part of hypertension management. The importance of attaining a healthy weight is also discussed.  BP/Weight 02/27/2016 12/05/2015 10/10/2015 09/05/2015 07/25/2015 06/06/2015 02/14/2015  Systolic BP 130 - 114 - - 134 914124  Diastolic BP 82 - 74 - - 82 82  Wt. (Lbs) 248 246.7 240 245 237 242 245  BMI 45.35 45.11 43.89 44.46 43.34 42.88 43.41

## 2016-03-02 NOTE — Assessment & Plan Note (Signed)
Hyperlipidemia:Low fat diet discussed and encouraged.   Lipid Panel  Lab Results  Component Value Date   CHOL 162 02/27/2016   HDL 76 02/27/2016   LDLCALC 74 02/27/2016   TRIG 58 02/27/2016   CHOLHDL 2.1 02/27/2016      Controlled, no change in medication

## 2016-03-02 NOTE — Assessment & Plan Note (Signed)
[name] is reminded of the importance of commitment to daily physical activity for 30 minutes or more, as able and the need to limit carbohydrate intake to 30 to 60 grams per meal to help with blood sugar control.   The need to take medication as prescribed, test blood sugar as directed, and to call between visits if there is a concern that blood sugar is uncontrolled is also discussed.   [name] is reminded of the importance of daily foot exam, annual eye examination, and good blood sugar, blood pressure and cholesterol control. Deteriorated  Diabetic Labs Latest Ref Rng 02/27/2016 10/10/2015 06/06/2015 02/14/2015 10/17/2014  HbA1c <5.7 % 7.1(H) 6.7(H) 6.9(H) 7.3(H) 6.8(H)  Microalbumin Not estab mg/dL 1.3 - - 4.3(H) -  Micro/Creat Ratio <30 mcg/mg creat 7 - - 37.7(H) -  Chol 125 - 200 mg/dL 119162 - 147139 - 829160  HDL >=56>=46 mg/dL 76 - 71 - 65  Calc LDL <130 mg/dL 74 - 53 - 80  Triglycerides <150 mg/dL 58 - 73 - 76  Creatinine 0.50 - 0.99 mg/dL 2.130.78 0.860.96 5.780.80 4.690.97 6.291.05   BP/Weight 02/27/2016 12/05/2015 10/10/2015 09/05/2015 07/25/2015 06/06/2015 02/14/2015  Systolic BP 130 - 114 - - 134 528124  Diastolic BP 82 - 74 - - 82 82  Wt. (Lbs) 248 246.7 240 245 237 242 245  BMI 45.35 45.11 43.89 44.46 43.34 42.88 43.41   Foot/eye exam completion dates 02/14/2015 02/02/2014  Foot Form Completion Done Done

## 2016-03-05 ENCOUNTER — Encounter: Payer: 59 | Attending: Family Medicine | Admitting: Nutrition

## 2016-03-05 VITALS — Ht 62.0 in | Wt 247.0 lb

## 2016-03-05 DIAGNOSIS — IMO0002 Reserved for concepts with insufficient information to code with codable children: Secondary | ICD-10-CM

## 2016-03-05 DIAGNOSIS — Z713 Dietary counseling and surveillance: Secondary | ICD-10-CM | POA: Insufficient documentation

## 2016-03-05 DIAGNOSIS — Z6841 Body Mass Index (BMI) 40.0 and over, adult: Secondary | ICD-10-CM | POA: Diagnosis not present

## 2016-03-05 DIAGNOSIS — E118 Type 2 diabetes mellitus with unspecified complications: Secondary | ICD-10-CM

## 2016-03-05 DIAGNOSIS — E1165 Type 2 diabetes mellitus with hyperglycemia: Secondary | ICD-10-CM

## 2016-03-05 DIAGNOSIS — E119 Type 2 diabetes mellitus without complications: Secondary | ICD-10-CM | POA: Insufficient documentation

## 2016-03-05 NOTE — Progress Notes (Signed)
   Medical Nutrition Therapy:  Appt start time: 11.30 end time:  1200.  Assessment:  Primary concerns today: Diabetes Type 2 DM A1C 7.1%. Changes made:  Trying to exercises. She has lost clothes sizes. Eating more greens and more fresh fruit..  No more cravings for sweets etc. Metformin 1000 mg BID and Invokana. Having issues with skipping lunch. Has had a few relatives who have died and that has been stressed. FBS 95 mg/dl. Eating some raisin and that has helped with bowel regularity. Doing better overall. Watching portions and making better choices.    Lab Results  Component Value Date   CREATININE 0.78 02/27/2016    Lab Results  Component Value Date   HGBA1C 7.1* 02/27/2016   Wt Readings from Last 3 Encounters:  02/27/16 248 lb (112.492 kg)  12/05/15 246 lb 11.2 oz (111.902 kg)  10/10/15 240 lb (108.863 kg)   Ht Readings from Last 3 Encounters:  02/27/16 5\' 2"  (1.575 m)  12/05/15 5\' 2"  (1.575 m)  10/10/15 5\' 2"  (1.575 m)   There is no weight on file to calculate BMI.  Lab Results  Component Value Date   CHOL 162 02/27/2016   HDL 76 02/27/2016   LDLCALC 74 02/27/2016   TRIG 58 02/27/2016   CHOLHDL 2.1 02/27/2016    Preferred Learning Style:   Auditory  Visual  Hands on    Learning Readiness:     Ready  Change in progress   MEDICATIONS: See list   DIETARY INTAKE:    24-hr recall:  B ( AM):  Boiled egg, 1 slice bacon and slice toast. Or oatmeal  Snk ( AM): none  L ( PM): skipped:  D ( PM): Pepperoni slice 1 and toss salad. Water  Snk ( PM):  Beverages: water, lemonade   Usual physical activity: walking and now mowing her yard. Estimated energy needs: 1200 calories 135 g carbohydrates 90 g protein 33 g fat  Progress Towards Goal(s):  In progress.  Nutritional Diagnosis:  NB-1.1 Food and nutrition-related knowledge deficit As related to Diabetes.  As evidenced by A1C 7.5%.    Intervention:  Nutrition and diabetes education provided on CHO  counting, meal planning, portion sizes, target ranges for blood sugars, treatment and s/s of hyper/hypoglycemia, benefits of exercise and weight loss and prevention of complications from DM. Importance of foot, eye and dental care. Metformin 100 BID and   Goals: Keep up the good work!!!  1. Follow My Plate Method 2. Increase fresh fruits and vegetables and whole grains. 3. Try prunes as  Needed for bowels. 4. Drink 5 bottles of water per day. 6. Eat three meals at B) 6-8 L)12-2 and Dinner 5-7 pm. 7. No snacks between meals. 8. Get A1C down to 6.5% in three months. 9. Lose 5 lbs by the next visit. 10. Use Mrs. Dash and avoid the salt substitutes.   Teaching Method Utilized:  Visual Auditory Hands on  Handouts given during visit include:  The Plate Method  Meal Plan Card  Diabetes Instructions  Barriers to learning/adherence to lifestyle change: None  Demonstrated degree of understanding via:  Teach Back   Monitoring/Evaluation:  Dietary intake, exercise, meal planning, SBG, and body weight in 3 month(s).

## 2016-03-05 NOTE — Patient Instructions (Signed)
Goals: Keep up the good work!!!  1. Follow My Plate Method 2. Increase fresh fruits and vegetables and whole grains. 3. Try prunes as  Needed for bowels. 4. Drink 5 bottles of water per day. 6. Eat three meals at B) 6-8 L)12-2 and Dinner 5-7 pm. 7. No snacks between meals. 8. Get A1C down to 6.5% in three months. 9. Lose 5 lbs by the next visit. 10. Use Mrs. Dash and avoid the salt substitutes.

## 2016-03-12 ENCOUNTER — Ambulatory Visit: Payer: 59 | Admitting: Cardiovascular Disease

## 2016-03-28 ENCOUNTER — Encounter: Payer: Self-pay | Admitting: Cardiology

## 2016-03-28 ENCOUNTER — Ambulatory Visit (INDEPENDENT_AMBULATORY_CARE_PROVIDER_SITE_OTHER): Payer: 59 | Admitting: Cardiology

## 2016-03-28 VITALS — BP 140/90 | HR 79 | Ht 63.0 in | Wt 246.0 lb

## 2016-03-28 DIAGNOSIS — E785 Hyperlipidemia, unspecified: Secondary | ICD-10-CM

## 2016-03-28 DIAGNOSIS — I10 Essential (primary) hypertension: Secondary | ICD-10-CM | POA: Diagnosis not present

## 2016-03-28 DIAGNOSIS — R002 Palpitations: Secondary | ICD-10-CM

## 2016-03-28 DIAGNOSIS — R0789 Other chest pain: Secondary | ICD-10-CM | POA: Diagnosis not present

## 2016-03-28 NOTE — Patient Instructions (Signed)
Your physician recommends that you schedule a follow-up appointment in: to be determined after tests    Your physician has requested that you have an echocardiogram. Echocardiography is a painless test that uses sound waves to create images of your heart. It provides your doctor with information about the size and shape of your heart and how well your heart's chambers and valves are working. This procedure takes approximately one hour. There are no restrictions for this procedure.  Your physician has recommended that you wear a holter monitor. Holter monitors are medical devices that record the heart's electrical activity. Doctors most often use these monitors to diagnose arrhythmias. Arrhythmias are problems with the speed or rhythm of the heartbeat. The monitor is a small, portable device. You can wear one while you do your normal daily activities. This is usually used to diagnose what is causing palpitations/syncope (passing out).    Thank you for choosing [facility] !

## 2016-03-28 NOTE — Progress Notes (Signed)
Cardiology Office Note  Date: 03/28/2016   ID: Tina Wilkins, DOB 05-Feb-1956, MRN [id number]  PCP: Syliva Overman, MD  Consulting Cardiologist: Nona Dell, MD   Chief Complaint  Patient presents with  . Chest Pain    History of Present Illness: Tina Wilkins is a 60 y.o. female referred for cardiology consultation by Dr. Lodema Hong. She presents with a history of palpitations, describes a feeling of heart skipping at times, sometimes mild dizziness, but no syncope. Unrelated to these symptoms she also notices an intermittent soreness in her left upper chest, sometimes left upper back. She states that it feels sore to touch. She has had previous left shoulder surgery. This chest discomfort is not associated with exertion.  She has cardiac risk factors including type 2 diabetes mellitus, hypertension, and hyperlipidemia. Medical regimen includes aspirin, Norvasc, Lotensin, clonidine, Maxzide, and Crestor. She has good LDL control at 74 based on recent lab work.  She has not undergone any prior cardiac structural testing or cardiac monitoring. I did review her most recent ECG.  She works at [employer].  Past Medical History  Diagnosis Date  . Seasonal allergies   . Obesity   . Type 2 diabetes mellitus (HCC) 2010  . Essential hypertension 2005  . Hyperlipidemia 2005    Past Surgical History  Procedure Laterality Date  . Shoulder surgery Left   . Abdominal hysterectomy  2004    Secondary to fibroids, single ovary left  . Cesarean section  1989    2 vaginal deliveries before    Current Outpatient Prescriptions  Medication Sig Dispense Refill  . Alcohol Swabs (ALCOHOL PREPS) PADS Use as directed when testing blood glucose 100 each 5  . amLODipine (NORVASC) 10 MG tablet TAKE 1 TABLET (10 MG TOTAL) BY MOUTH DAILY. 90 tablet 1  . aspirin 81 MG tablet Take 1 tablet (81 mg total) by mouth daily. 90 tablet 1  . benazepril (LOTENSIN) 40 MG tablet Take 1 tablet  (40 mg total) by mouth daily. 90 tablet 1  . calcium-vitamin D (OSCAL WITH D) 500-200 MG-UNIT per tablet Take 1 tablet by mouth 2 (two) times daily.    . canagliflozin (INVOKANA) 300 MG TABS tablet TAKE 1 TABLET (300 MG TOTAL) BY MOUTH DAILY. 90 tablet 1  . cloNIDine (CATAPRES) 0.3 MG tablet Take 1 tablet (0.3 mg total) by mouth at bedtime. 90 tablet 1  . fluticasone (FLONASE) 50 MCG/ACT nasal spray Place 2 sprays into both nostrils daily. 48 g 0  . metFORMIN (GLUCOPHAGE) 1000 MG tablet TAKE 1 TABLET (1,000 MG TOTAL) BY MOUTH 2 (TWO) TIMES DAILY WITH A MEAL. 180 tablet 1  . methocarbamol (ROBAXIN) 500 MG tablet TAKE 1 TABLET BY MOUTH AT BEDTIME AS NEEDED FOR BACK PAIN AND SPASMS 90 tablet 0  . pantoprazole (PROTONIX) 40 MG tablet Take 1 tablet (40 mg total) by mouth daily. 90 tablet 1  . rosuvastatin (CRESTOR) 20 MG tablet Take 1 tablet (20 mg total) by mouth daily. 90 tablet 1  . triamterene-hydrochlorothiazide (MAXZIDE-25) 37.5-25 MG tablet Take 1 tablet by mouth daily. 90 tablet 1  . TRUEPLUS LANCETS 30G MISC USE TO TEST BLOOD SUGAR ONCE A DAY 100 each 1  . TRUETRACK TEST test strip USE AS INSTRUCTED FOR ONCE DAILY TESTING 50 each 5  . Vitamin D, Ergocalciferol, (DRISDOL) 50000 units CAPS capsule TAKE 1 CAPSULE (50,000 UNITS) BY MOUTH ONCE A WEEK. 12 capsule 1   No current facility-administered medications for this visit.  Allergies:  Celecoxib; Nolamine; Sitagliptin-metformin hcl; and Skelaxin   Social History: The patient  reports that she has never smoked. She does not have any smokeless tobacco history on file. She reports that she does not drink alcohol or use illicit drugs.   Family History: The patient's family history includes Cancer in her brother; Diabetes in her father and sister; Heart disease in her brother, father, mother, and sister; Stroke in her father and mother.   ROS:  Please see the history of present illness. Otherwise, complete review of systems is positive for recent  trouble with seasonal allergies and bronchitis.  All other systems are reviewed and negative.   Physical Exam: VS:  BP 140/90 mmHg  Pulse 79  Ht 5\' 3"  (1.6 m)  Wt 246 lb (111.585 kg)  BMI 43.59 kg/m2  SpO2 98%, BMI Body mass index is 43.59 kg/(m^2).  Wt Readings from Last 3 Encounters:  03/28/16 246 lb (111.585 kg)  03/10/16 247 lb (112.038 kg)  02/27/16 248 lb (112.492 kg)    General: Patient appears comfortable at rest. HEENT: Conjunctiva and lids normal, oropharynx clear with moist mucosa. Neck: Supple, no elevated JVP or carotid bruits, no thyromegaly. Lungs: Clear to auscultation, nonlabored breathing at rest. Cardiac: Regular rate and rhythm, no S3 or significant systolic murmur, no pericardial rub. Abdomen: Soft, nontender, no hepatomegaly, bowel sounds present, no guarding or rebound. Extremities: No pitting edema, distal pulses 2+. Skin: Warm and dry. Musculoskeletal: No kyphosis. Neuropsychiatric: Alert and oriented x3, affect grossly appropriate.  ECG: I personally reviewed the prior tracing from 02/27/2016 which showed sinus rhythm with increased voltage, left anterior fascicular block, prolonged PR interval, decreased R wave progression.  Recent Labwork: 02/27/2016: ALT 21; AST 15; BUN 10; Creat 0.78; Hemoglobin 13.0; Platelets 406*; Potassium 4.2; Sodium 143; TSH 1.92     Component Value Date/Time   CHOL 162 02/27/2016 0833   TRIG 58 02/27/2016 0833   HDL 76 02/27/2016 0833   CHOLHDL 2.1 02/27/2016 0833   VLDL 12 02/27/2016 0833   LDLCALC 74 02/27/2016 0833    Other Studies Reviewed Today:  Echocardiogram 03/16/2008: SUMMARY - Overall left ventricular systolic function was normal. There were    no left ventricular regional wall motion abnormalities. - There was mild mitral valvular regurgitation. - The left atrium was mild to moderately dilated. - The right ventricle was mildly dilated. There was mild right    ventricular  hypertrophy.  Chest x-ray 01/26/2013: Findings: Heart size is upper limits of normal in size. No pleural effusion or edema. No airspace consolidation. Mild spondylosis identified within the thoracic spine.  IMPRESSION:  1. No acute cardiopulmonary abnormalities.  Assessment and Plan:  1. Palpitations associated with mild dizziness at times but no syncope. We will obtain a 48-hour Holter monitor to further investigate, also an echocardiogram for cardiac structural assessment.  2. Atypical chest discomfort, nonexertional, not associated with palpitations. Sounds more musculoskeletal based on description. We will do basic cardiac structural assessment with echocardiogram and in light of palpitations, also cardiac monitoring. If any substantial abnormalities are uncovered, we can consider further ischemic workup, but at this point would recommend risk factor modification and medical therapy.  3. Hyperlipidemia, on statin therapy, with recent LDL 74.  4. Essential hypertension, blood pressure mildly elevated today. Keep follow-up Dr. Lodema HongSimpson on medical therapy. Weight loss of be beneficial.  Current medicines were reviewed with the patient today.   Orders Placed This Encounter  Procedures  . Holter monitor - 48 hour  .  Echocardiogram    Disposition: Call with results.   Signed, Jonelle Sidle, MD, [facility] 03/28/2016 9:23 AM    [facility] at [facility] [address] [place], [place] [address] Phone: [phone]; Fax: [phone]

## 2016-03-31 ENCOUNTER — Ambulatory Visit (HOSPITAL_COMMUNITY)
Admission: RE | Admit: 2016-03-31 | Discharge: 2016-03-31 | Disposition: A | Discharge status: Home / Self Care | Payer: 59 | Source: Ambulatory Visit | Attending: Cardiology | Admitting: Cardiology

## 2016-03-31 ENCOUNTER — Ambulatory Visit (HOSPITAL_BASED_OUTPATIENT_CLINIC_OR_DEPARTMENT_OTHER)
Admission: RE | Admit: 2016-03-31 | Discharge: 2016-03-31 | Disposition: A | Discharge status: Home / Self Care | Payer: 59 | Source: Ambulatory Visit | Attending: Cardiology | Admitting: Cardiology

## 2016-03-31 DIAGNOSIS — I10 Essential (primary) hypertension: Secondary | ICD-10-CM | POA: Diagnosis not present

## 2016-03-31 DIAGNOSIS — E119 Type 2 diabetes mellitus without complications: Secondary | ICD-10-CM | POA: Insufficient documentation

## 2016-03-31 DIAGNOSIS — R002 Palpitations: Secondary | ICD-10-CM | POA: Diagnosis not present

## 2016-03-31 DIAGNOSIS — R0789 Other chest pain: Secondary | ICD-10-CM | POA: Insufficient documentation

## 2016-03-31 DIAGNOSIS — E785 Hyperlipidemia, unspecified: Secondary | ICD-10-CM | POA: Insufficient documentation

## 2016-04-04 ENCOUNTER — Telehealth: Payer: Self-pay

## 2016-04-04 MED ORDER — METOPROLOL SUCCINATE ER 25 MG PO TB24: 25.0000 mg | ORAL_TABLET | Freq: Every day | ORAL | Status: DC

## 2016-04-04 MED FILL — METOPROLOL SUCC ER 25 MG TA: 25 | 90 days supply | Qty: 90 | Fill #0

## 2016-04-04 NOTE — Telephone Encounter (Signed)
-----   Message from Jonelle SidleSamuel G McDowell, MD sent at 04/03/2016  2:58 PM EDT ----- Reviewed strips. Sinus rhythm present, but she does have brief episodes of SVT that she could certainly be feeling as palpitations. Nothing that looks worrisome. If she would like to try low-dose beta blocker such as Toprol-XL 25 mg daily, this might lessen her symptoms.

## 2016-04-04 NOTE — Telephone Encounter (Signed)
   Results given to pt,will start toprol xl,escribe to pt's pharmacy

## 2016-05-26 ENCOUNTER — Other Ambulatory Visit (HOSPITAL_COMMUNITY)
Admission: RE | Admit: 2016-05-26 | Discharge: 2016-05-26 | Disposition: A | Discharge status: Home / Self Care | Payer: 59 | Source: Ambulatory Visit | Attending: Family Medicine | Admitting: Family Medicine

## 2016-05-26 ENCOUNTER — Encounter (INDEPENDENT_AMBULATORY_CARE_PROVIDER_SITE_OTHER): Payer: Self-pay

## 2016-05-26 ENCOUNTER — Ambulatory Visit (INDEPENDENT_AMBULATORY_CARE_PROVIDER_SITE_OTHER): Payer: 59 | Admitting: Family Medicine

## 2016-05-26 ENCOUNTER — Encounter: Payer: Self-pay | Admitting: Family Medicine

## 2016-05-26 VITALS — BP 136/82 | HR 84 | Resp 18 | Ht 62.0 in | Wt 247.0 lb

## 2016-05-26 DIAGNOSIS — Z01419 Encounter for gynecological examination (general) (routine) without abnormal findings: Secondary | ICD-10-CM | POA: Diagnosis not present

## 2016-05-26 DIAGNOSIS — E119 Type 2 diabetes mellitus without complications: Secondary | ICD-10-CM

## 2016-05-26 DIAGNOSIS — Z1151 Encounter for screening for human papillomavirus (HPV): Secondary | ICD-10-CM | POA: Diagnosis not present

## 2016-05-26 DIAGNOSIS — Z0000 Encounter for general adult medical examination without abnormal findings: Secondary | ICD-10-CM

## 2016-05-26 DIAGNOSIS — Z1211 Encounter for screening for malignant neoplasm of colon: Secondary | ICD-10-CM

## 2016-05-26 DIAGNOSIS — W19XXXA Unspecified fall, initial encounter: Secondary | ICD-10-CM

## 2016-05-26 DIAGNOSIS — E669 Obesity, unspecified: Secondary | ICD-10-CM

## 2016-05-26 DIAGNOSIS — Z124 Encounter for screening for malignant neoplasm of cervix: Secondary | ICD-10-CM | POA: Diagnosis not present

## 2016-05-26 DIAGNOSIS — Y92009 Unspecified place in unspecified non-institutional (private) residence as the place of occurrence of the external cause: Secondary | ICD-10-CM

## 2016-05-26 DIAGNOSIS — Y92099 Unspecified place in other non-institutional residence as the place of occurrence of the external cause: Secondary | ICD-10-CM

## 2016-05-26 DIAGNOSIS — E1169 Type 2 diabetes mellitus with other specified complication: Secondary | ICD-10-CM

## 2016-05-26 LAB — POC HEMOCCULT BLD/STL (OFFICE/1-CARD/DIAGNOSTIC): Fecal Occult Blood, POC: NEGATIVE

## 2016-05-26 MED ORDER — METHOCARBAMOL 500 MG PO TABS: ORAL_TABLET | ORAL | Status: DC

## 2016-05-26 MED FILL — METHOCARBAMOL 500 MG TABLET: 500 | 90 days supply | Qty: 90 | Fill #0

## 2016-05-26 NOTE — Patient Instructions (Addendum)
F/u in 4 month, call if you need me sooner  Non fast labs first week in July  Good exam, no med changes  Use tylenol one twice daily for next 3 to 5 days for left 5th toe pain from recent fall  PLEASE try to keep floor clear, so less risk of accidents   Thank you  for choosing [facility]. We consider it a privelige to serve you.  Delivering excellent health care in a caring and  compassionate way is our goal.  Partnering with you,  so that together we can achieve this goal is our strategy.

## 2016-05-28 LAB — CYTOLOGY - PAP

## 2016-05-29 ENCOUNTER — Other Ambulatory Visit: Payer: Self-pay | Admitting: Family Medicine

## 2016-05-29 MED FILL — INVOKANA 300 MG TABLET: 300 | 90 days supply | Qty: 90 | Fill #1

## 2016-05-29 MED FILL — BENAZEPRIL HCL 40 MG TABLET: 40 | 90 days supply | Qty: 90 | Fill #1

## 2016-05-29 MED FILL — cloNIDine HCL 0.3 MG TABS: 0.3 | 90 days supply | Qty: 90 | Fill #1

## 2016-05-29 MED FILL — TRIAMTERENE/HCTZ 37.5/25 TB: 37.5-25 | 90 days supply | Qty: 90 | Fill #1

## 2016-05-29 MED FILL — metFORMIN HCL 1000 MG TABS: 1000 | 90 days supply | Qty: 180 | Fill #0

## 2016-05-29 MED FILL — AMLODIPINE BESYLATE 10 MG T: 10 | 90 days supply | Qty: 90 | Fill #1

## 2016-05-29 MED FILL — VIT D2 1.25 MG (50,000 UNIT: 1.25 MG | 84 days supply | Qty: 12 | Fill #1

## 2016-05-29 MED FILL — ROSUVASTATIN CALCIUM 20 MG: 20 | 90 days supply | Qty: 90 | Fill #1

## 2016-05-29 MED FILL — PANTOPRAZOLE SOD DR 40 MG T: 40 | 90 days supply | Qty: 90 | Fill #1

## 2016-06-01 DIAGNOSIS — W19XXXA Unspecified fall, initial encounter: Secondary | ICD-10-CM | POA: Insufficient documentation

## 2016-06-01 DIAGNOSIS — Z0000 Encounter for general adult medical examination without abnormal findings: Principal | ICD-10-CM

## 2016-06-01 DIAGNOSIS — Y92009 Unspecified place in unspecified non-institutional (private) residence as the place of occurrence of the external cause: Secondary | ICD-10-CM | POA: Insufficient documentation

## 2016-06-01 DIAGNOSIS — Z124 Encounter for screening for malignant neoplasm of cervix: Secondary | ICD-10-CM | POA: Insufficient documentation

## 2016-06-01 NOTE — Assessment & Plan Note (Signed)

## 2016-06-01 NOTE — Assessment & Plan Note (Signed)
Accidentally tripped on pillow in her bedroom, c/o left 5th toe pain, occurred on day of visit, able to weight bear, no bruising or skin breakdown noted. Home safety reviewed. No imaging indicated. Tylenol for pain for 3 to 5 days, call back if worsening symptoms

## 2016-06-01 NOTE — Progress Notes (Signed)
   Subjective:    Patient ID: Tina Wilkins, female    DOB: 09/01/1956, 60 y.o.   MRN: [id number]  HPI Patient is in for annual physical exam. C/o fall this morning in her bedroom with left 5th toe pain. Recent labs, if available are reviewed. Immunization is reviewed , and  updated if needed.    Review of Systems See HPI      Objective:   Physical Exam BP 136/82 mmHg  Pulse 84  Resp 18  Ht 5\' 2"  (1.575 m)  Wt 247 lb (112.038 kg)  BMI 45.17 kg/m2  SpO2 98%   Pleasant  female, alert and oriented x 3, in no cardio-pulmonary distress. Afebrile. HEENT No facial trauma or asymetry. Sinuses non tender.  Extra occullar muscles intact. External ears normal, tympanic membranes clear. Oropharynx moist, no exudate, good dentition. Neck: supple, no adenopathy,JVD or thyromegaly.No bruits.  Chest: Clear to ascultation bilaterally.No crackles or wheezes. Non tender to palpation  Breast: No asymetry,no masses or lumps. No tenderness. No nipple discharge or inversion. No axillary or supraclavicular adenopathy  Cardiovascular system; Heart sounds normal,  S1 and  S2 ,no S3.  No murmur, or thrill. Apical beat not displaced Peripheral pulses normal.  Abdomen: Soft, non tender, no organomegaly or masses. No bruits. Bowel sounds normal. No guarding, tenderness or rebound.  Rectal:  Normal sphincter tone. No mass.No rectal masses.  Guaiac negative stool.  GU: External genitalia normal female genitalia , female distribution of hair. No lesions. Urethral meatus normal in size, no  Prolapse, no lesions visibly  Present. Bladder non tender. Vagina pink and moist , with no visible lesions , discharge present . Adequate pelvic support no  cystocele or rectocele noted Uterus absent, no adnexal masses or tenderness  Musculoskeletal exam: Full ROM of spine, hips , shoulders and knees. Mild tenderness on left 5th toe, no bruising or skin breakdown,  Mild limitation in mobility  due to acute pain and trauma No deformity ,swelling or crepitus noted. No muscle wasting or atrophy.   Neurologic: Cranial nerves 2 to 12 intact. Power, tone ,sensation and reflexes normal throughout. No disturbance in gait. No tremor.  Skin: Intact, no ulceration, erythema , scaling or rash noted. Pigmentation normal throughout  Psych; Normal mood and affect. Judgement and concentration normal        Assessment & Plan:  Annual physical exam Annual exam as documented. Counseling done  re healthy lifestyle involving commitment to 150 minutes exercise per week, heart healthy diet, and attaining healthy weight.The importance of adequate sleep also discussed. Regular seat belt use and home safety, is also discussed. Changes in health habits are decided on by the patient with goals and time frames  set for achieving them. Immunization and cancer screening needs are specifically addressed at this visit.   Fall at home Accidentally tripped on pillow in her bedroom, c/o left 5th toe pain, occurred on day of visit, able to weight bear, no bruising or skin breakdown noted. Home safety reviewed. No imaging indicated. Tylenol for pain for 3 to 5 days, call back if worsening symptoms

## 2016-06-09 DIAGNOSIS — E119 Type 2 diabetes mellitus without complications: Secondary | ICD-10-CM | POA: Diagnosis not present

## 2016-06-09 DIAGNOSIS — E669 Obesity, unspecified: Secondary | ICD-10-CM | POA: Diagnosis not present

## 2016-06-10 LAB — HEMOGLOBIN A1C
Hgb A1c MFr Bld: 6.7 % — ABNORMAL HIGH (ref <5.7)
MEAN PLASMA GLUCOSE: 146 mg/dL

## 2016-06-10 LAB — BASIC METABOLIC PANEL
BUN: 10 mg/dL (ref 7–25)
CHLORIDE: 107 mmol/L (ref 98–110)
CO2: 27 mmol/L (ref 20–31)
Calcium: 9.3 mg/dL (ref 8.6–10.4)
Creat: 0.81 mg/dL (ref 0.50–0.99)
Glucose, Bld: 93 mg/dL (ref 65–99)
POTASSIUM: 4.1 mmol/L (ref 3.5–5.3)
SODIUM: 143 mmol/L (ref 135–146)

## 2016-06-11 ENCOUNTER — Encounter: Payer: 59 | Attending: Family Medicine | Admitting: Nutrition

## 2016-06-11 VITALS — Ht 62.0 in | Wt 246.2 lb

## 2016-06-11 DIAGNOSIS — Z6841 Body Mass Index (BMI) 40.0 and over, adult: Secondary | ICD-10-CM | POA: Insufficient documentation

## 2016-06-11 DIAGNOSIS — Z713 Dietary counseling and surveillance: Secondary | ICD-10-CM | POA: Insufficient documentation

## 2016-06-11 DIAGNOSIS — E119 Type 2 diabetes mellitus without complications: Secondary | ICD-10-CM | POA: Diagnosis not present

## 2016-06-11 NOTE — Patient Instructions (Signed)
Goals: Keep up the good work!!!  1. Follow My Plate Method 2. Increase fresh fruits and vegetables and whole grains. 3.. Drink 5 bottles of water per day. 6. Eat three meals at B) 6-8 L)12-2 and Dinner 5-7 pm. 7. No snacks between meals. 8. Get A1C down to 6.5% in three months. 9. Lose 5 lbs by the next visit. 10. Increase exercise 30 minutes three days per week .

## 2016-06-11 NOTE — Progress Notes (Signed)
   Medical Nutrition Therapy:  Appt start time: 11.00 end time:  1130.  Assessment:  Primary concerns today: Diabetes Type 2 DM A1C 6.7%, down from 7.1%.. She has increased walking her walking at home.   Lost 1 lbs. Watching portions and making better choices.    Lab Results  Component Value Date   CREATININE 0.81 06/09/2016    Lab Results  Component Value Date   HGBA1C 6.7* 06/09/2016   Wt Readings from Last 3 Encounters:  06/11/16 246 lb 3.2 oz (111.676 kg)  05/26/16 247 lb (112.038 kg)  03/28/16 246 lb (111.585 kg)   Ht Readings from Last 3 Encounters:  06/11/16 5\' 2"  (1.575 m)  05/26/16 5\' 2"  (1.575 m)  03/28/16 5\' 3"  (1.6 m)   Body mass index is 45.02 kg/(m^2).  Lab Results  Component Value Date   CHOL 162 02/27/2016   HDL 76 02/27/2016   LDLCALC 74 02/27/2016   TRIG 58 02/27/2016   CHOLHDL 2.1 02/27/2016    Preferred Learning Style:   Auditory  Visual  Hands on    Learning Readiness:     Ready  Change in progress   MEDICATIONS: See list   DIETARY INTAKE:    24-hr recall:  B ( AM):  Boiled egg, 1 slice bacon and slice toast. Or oatmeal  Snk ( AM): none  L ( PM): Bologna sandwich on ww bread, lettuce, fruit, Water D ( PM): Chicken breast, pinto beans, potato boiled, corn bread, water  Snk ( PM):  Beverages: water, lemonade   Usual physical activity: walking and now mowing her yard. Estimated energy needs: 1200 calories 135 g carbohydrates 90 g protein 33 g fat  Progress Towards Goal(s):  In progress.  Nutritional Diagnosis:  NB-1.1 Food and nutrition-related knowledge deficit As related to Diabetes.  As evidenced by A1C 7.5%.    Intervention:  Nutrition and diabetes education provided on CHO counting, meal planning, portion sizes, target ranges for blood sugars, treatment and s/s of hyper/hypoglycemia, benefits of exercise and weight loss and prevention of complications from DM. Importance of foot, eye and dental care. Metformin 100  BID and   Goals: Keep up the good work!!!  1. Follow My Plate Method 2. Increase fresh fruits and vegetables and whole grains. 3.. Drink 5 bottles of water per day. 6. Eat three meals at B) 6-8 L)12-2 and Dinner 5-7 pm. 7. No snacks between meals. 8. Get A1C down to 6.5% in three months. 9. Lose 5 lbs by the next visit. 10. Increase exercise 30 minutes three days per week .  Teaching Method Utilized:  Visual Auditory Hands on  Handouts given during visit include:  The Plate Method  Meal Plan Card  Diabetes Instructions  Barriers to learning/adherence to lifestyle change: None  Demonstrated degree of understanding via:  Teach Back   Monitoring/Evaluation:  Dietary intake, exercise, meal planning, SBG, and body weight in 3 month(s).

## 2016-07-01 MED FILL — METOPROLOL SUCC ER 25 MG TA: 25 | 90 days supply | Qty: 90 | Fill #1

## 2016-07-14 ENCOUNTER — Encounter: Payer: Self-pay | Admitting: Pharmacist

## 2016-07-14 ENCOUNTER — Other Ambulatory Visit: Payer: Self-pay | Admitting: Pharmacist

## 2016-07-14 NOTE — Patient Outreach (Signed)
[facility] Care Management  07/14/2016  Tina Wilkins 09/10/1956 [id number]   Subjective:  Patient presents today for 3 month diabetes follow-up as part of the employer-sponsored Link to Wellness program.  Current diabetes regimen includes metformin.  Patient also continues on daily ASA, ARB and statin.  Most recent MD follow-up was 05/26/16.  No med changes or major health changes at this time.    Assessment:  Diabetes: Most recent A1C was 6.7% which is at goal of less than 7%. Weight is increased from last visit with me.   Lifestyle improvements:  Physical Activity- Recently added some yardwork due to nice weather. She intends [facility] [employer] once she retires in 3 weeks. Much of our conversation today was about inserting intentional efforts in her post-retirement life to keep her active.   Nutrition- Same varied diet that is mostly non-diabetic focused. We discussed carbohydrate control and calorie restriction today.   Plan/Goals for Next Visit:  1. Rejoin [employer] and begin exercising regularly. Goal is 150 minutes per week of exercise that gets your heart moving. Remember how much better you feel and how much better your numbers are when you are active. 2. Cut down on sugary foods and desserts.

## 2016-08-12 ENCOUNTER — Other Ambulatory Visit: Payer: Self-pay | Admitting: Family Medicine

## 2016-08-14 MED FILL — ROSUVASTATIN CALCIUM 20 MG: 20 | 90 days supply | Qty: 90 | Fill #0

## 2016-08-14 MED FILL — AMLODIPINE BESYLATE 10 MG T: 10 | 90 days supply | Qty: 90 | Fill #0

## 2016-08-14 MED FILL — BENAZEPRIL HCL 40 MG TABLET: 40 | 90 days supply | Qty: 90 | Fill #0

## 2016-08-14 MED FILL — INVOKANA 300 MG TABLET: 300 | 90 days supply | Qty: 90 | Fill #0

## 2016-08-14 MED FILL — VIT D2 1.25 MG (50,000 UNIT: 1.25 MG | 84 days supply | Qty: 12 | Fill #0

## 2016-08-14 MED FILL — TRIAMTERENE-HCTZ 37.5-25 MG: 37.5-25 | 90 days supply | Qty: 90 | Fill #0

## 2016-08-14 MED FILL — cloNIDine HCL 0.3 MG TABS: 0.3 | 90 days supply | Qty: 90 | Fill #0

## 2016-08-14 MED FILL — METHOCARBAMOL 500 MG TABLET: 500 | 90 days supply | Qty: 90 | Fill #0

## 2016-08-14 MED FILL — PANTOPRAZOLE SOD DR 40 MG T: 40 | 90 days supply | Qty: 90 | Fill #0

## 2016-08-22 MED FILL — RESTASIS MULTIDOSE 0.05% EY: 0.05 | 83 days supply | Qty: 17 | Fill #0

## 2016-09-10 ENCOUNTER — Telehealth: Payer: Self-pay

## 2016-09-10 ENCOUNTER — Encounter: Payer: 59 | Attending: Family Medicine | Admitting: Nutrition

## 2016-09-10 VITALS — Ht 62.0 in | Wt 247.0 lb

## 2016-09-10 DIAGNOSIS — Z713 Dietary counseling and surveillance: Secondary | ICD-10-CM | POA: Insufficient documentation

## 2016-09-10 DIAGNOSIS — IMO0002 Reserved for concepts with insufficient information to code with codable children: Secondary | ICD-10-CM

## 2016-09-10 DIAGNOSIS — Z6841 Body Mass Index (BMI) 40.0 and over, adult: Secondary | ICD-10-CM | POA: Insufficient documentation

## 2016-09-10 DIAGNOSIS — E669 Obesity, unspecified: Principal | ICD-10-CM

## 2016-09-10 DIAGNOSIS — E1165 Type 2 diabetes mellitus with hyperglycemia: Secondary | ICD-10-CM

## 2016-09-10 DIAGNOSIS — E1169 Type 2 diabetes mellitus with other specified complication: Secondary | ICD-10-CM

## 2016-09-10 DIAGNOSIS — E118 Type 2 diabetes mellitus with unspecified complications: Secondary | ICD-10-CM

## 2016-09-10 DIAGNOSIS — E119 Type 2 diabetes mellitus without complications: Secondary | ICD-10-CM | POA: Insufficient documentation

## 2016-09-10 NOTE — Telephone Encounter (Signed)
Nutritional referral entered

## 2016-09-10 NOTE — Progress Notes (Signed)
   Medical Nutrition Therapy:  Appt start time: 11.00 end time:  1130.  Assessment:  Primary concerns today: Diabetes Type 2 DM  . Lost 22.5 lbs in the last 2 years. Going to retire in next month. Still working on eating balanced meals. Staying within 30-45 grams of carbs guidelines fairly well.  Sees  Dr. Lodema HongSimpson Walking some but due to work schedule, is limited from long hours in ER.    Still taking 1000 mg of Metformin BID and Invokana. Encouraged to continue more whole grains and fresh fruits and low carb vegetables.   Lab Results  Component Value Date   HGBA1C 6.7 (H) 06/09/2016    Lab Results  Component Value Date   CREATININE 0.81 06/09/2016    Lab Results  Component Value Date   HGBA1C 6.7 (H) 06/09/2016   Wt Readings from Last 3 Encounters:  07/14/16 250 lb 12.8 oz (113.8 kg)  06/11/16 246 lb 3.2 oz (111.7 kg)  05/26/16 247 lb (112 kg)   Ht Readings from Last 3 Encounters:  07/14/16 5\' 3"  (1.6 m)  06/11/16 5\' 2"  (1.575 m)  05/26/16 5\' 2"  (1.575 m)   There is no height or weight on file to calculate BMI.  Lab Results  Component Value Date   CHOL 162 02/27/2016   HDL 76 02/27/2016   LDLCALC 74 02/27/2016   TRIG 58 02/27/2016   CHOLHDL 2.1 02/27/2016    Preferred Learning Style:   Auditory  Visual  Hands on    Learning Readiness:     Ready  Change in progress   MEDICATIONS: See list   DIETARY INTAKE:    24-hr recall:  B ( AM): Boiled egg and 1 slice bacon and coffeeSnk ( AM): none  L ( PM): Toss salad with baked potato,  OR mc/cheese, corn and salad and chicken finger, lemonade SF, water D ( PM): Sloppy Joe and 1/4 c pork an beans water Snk ( PM):  Beverages: water, lemonade   Usual physical activity: walking and now mowing her yard. Estimated energy needs: 1200 calories 135 g carbohydrates 90 g protein 33 g fat  Progress Towards Goal(s):  In progress.  Nutritional Diagnosis:  NB-1.1 Food and nutrition-related knowledge deficit  As related to Diabetes.  As evidenced by A1C 7.5%.    Intervention:  Nutrition and diabetes education provided on CHO counting, meal planning, portion sizes, target ranges for blood sugars, treatment and s/s of hyper/hypoglycemia, benefits of exercise and weight loss and prevention of complications from DM. Importance of foot, eye and dental care.   .Goals 1. Exercise 30 minutes 5 times per week 2. Increase low carb vegetables to 2 per meals 3. Cut down on processed foods of bacon, hamburger helper etc. 4. Lose 1 lb per week. Get A1C to 6.5%.  Teaching Method Utilized:  Visual Auditory Hands on  Handouts given during visit include:  The Plate Method  Meal Plan Card  Diabetes Instructions  Barriers to learning/adherence to lifestyle change: None  Demonstrated degree of understanding via:  Teach Back   Monitoring/Evaluation:  Dietary intake, exercise, meal planning, SBG, and body weight in 3 month(s).

## 2016-09-10 NOTE — Patient Instructions (Signed)
Goals 1. Exercise 30 minutes 5 times per week 2. Increase low carb vegetables to 2 per meals 3. Cut down on processed foods of bacon, hamburger helper etc. 4. Lose 1 lb per week. Get A1C to 6.5%.

## 2016-10-01 ENCOUNTER — Encounter: Payer: Self-pay | Admitting: Family Medicine

## 2016-10-01 ENCOUNTER — Ambulatory Visit (INDEPENDENT_AMBULATORY_CARE_PROVIDER_SITE_OTHER): Payer: 59 | Admitting: Family Medicine

## 2016-10-01 VITALS — BP 140/90 | HR 78 | Temp 98.2°F | Ht 62.0 in | Wt 251.0 lb

## 2016-10-01 DIAGNOSIS — I10 Essential (primary) hypertension: Secondary | ICD-10-CM

## 2016-10-01 DIAGNOSIS — E559 Vitamin D deficiency, unspecified: Secondary | ICD-10-CM

## 2016-10-01 DIAGNOSIS — E1169 Type 2 diabetes mellitus with other specified complication: Secondary | ICD-10-CM

## 2016-10-01 DIAGNOSIS — J209 Acute bronchitis, unspecified: Secondary | ICD-10-CM | POA: Insufficient documentation

## 2016-10-01 DIAGNOSIS — E785 Hyperlipidemia, unspecified: Secondary | ICD-10-CM

## 2016-10-01 DIAGNOSIS — E669 Obesity, unspecified: Secondary | ICD-10-CM | POA: Diagnosis not present

## 2016-10-01 DIAGNOSIS — J320 Chronic maxillary sinusitis: Secondary | ICD-10-CM

## 2016-10-01 LAB — LIPID PANEL
CHOL/HDL RATIO: 2.3 ratio (ref <=5.0)
CHOLESTEROL: 169 mg/dL (ref 125–200)
HDL: 74 mg/dL (ref >=46)
LDL Cholesterol: 81 mg/dL (ref <130)
Triglycerides: 68 mg/dL (ref <150)
VLDL: 14 mg/dL (ref <30)

## 2016-10-01 LAB — COMPLETE METABOLIC PANEL WITH GFR
ALBUMIN: 4.2 g/dL (ref 3.6–5.1)
ALK PHOS: 82 U/L (ref 33–130)
ALT: 22 U/L (ref 6–29)
AST: 20 U/L (ref 10–35)
BUN: 10 mg/dL (ref 7–25)
CALCIUM: 9.8 mg/dL (ref 8.6–10.4)
CHLORIDE: 106 mmol/L (ref 98–110)
CO2: 28 mmol/L (ref 20–31)
CREATININE: 1.01 mg/dL — AB (ref 0.50–0.99)
GFR, Est African American: 69 mL/min (ref >=60)
GFR, Est Non African American: 60 mL/min (ref >=60)
GLUCOSE: 102 mg/dL — AB (ref 65–99)
Potassium: 4.3 mmol/L (ref 3.5–5.3)
SODIUM: 143 mmol/L (ref 135–146)
Total Bilirubin: 0.9 mg/dL (ref 0.2–1.2)
Total Protein: 7 g/dL (ref 6.1–8.1)

## 2016-10-01 MED ORDER — LEVOFLOXACIN 500 MG PO TABS: 500.0000 mg | ORAL_TABLET | Freq: Every day | ORAL | 0 refills | Status: DC

## 2016-10-01 MED ORDER — PROMETHAZINE-DM 6.25-15 MG/5ML PO SYRP
ORAL_SOLUTION | ORAL | 0 refills | Status: DC
Start: 2016-10-01 — End: 2017-04-01

## 2016-10-01 MED ORDER — TRIAMTERENE-HCTZ 37.5-25 MG PO TABS: ORAL_TABLET | ORAL | 3 refills | Status: DC

## 2016-10-01 MED ORDER — BENZONATATE 100 MG PO CAPS: 100.0000 mg | ORAL_CAPSULE | Freq: Two times a day (BID) | ORAL | 0 refills | Status: DC | PRN

## 2016-10-01 MED ORDER — MONTELUKAST SODIUM 10 MG PO TABS: 10.0000 mg | ORAL_TABLET | Freq: Every day | ORAL | 0 refills | Status: DC

## 2016-10-01 MED FILL — MONTELUKAST SOD 10 MG TAB: 10 | 90 days supply | Qty: 90 | Fill #0

## 2016-10-01 MED FILL — PROMETHAZINE-DM SYRUP: 6.25-15 | 36 days supply | Qty: 180 | Fill #0

## 2016-10-01 NOTE — Assessment & Plan Note (Signed)
Uncontrolled, dose increase in maxzide DASH diet and commitment to daily physical activity for a minimum of 30 minutes discussed and encouraged, as a part of hypertension management. The importance of attaining a healthy weight is also discussed.  BP/Weight 10/01/2016 09/10/2016 07/14/2016 06/11/2016 05/26/2016 03/28/2016 03/05/2016  Systolic BP 140 - 157 - 136 140 -  Diastolic BP 90 - 97 - 82 90 -  Wt. (Lbs) 251 247 250.8 246.2 247 246 247  BMI 45.91 45.18 44.43 45.02 45.17 43.59 45.17     Nurse bP re check in 6 weeks

## 2016-10-01 NOTE — Assessment & Plan Note (Signed)
Deteriorated. Patient re-educated about  the importance of commitment to a  minimum of 150 minutes of exercise per week.  The importance of healthy food choices with portion control discussed. Encouraged to start a food diary, count calories and to consider  joining a support group. Sample diet sheets offered. Goals set by the patient for the next several months.   Weight /BMI 10/01/2016 09/10/2016 07/14/2016  WEIGHT 251 lb 247 lb 250 lb 12.8 oz  HEIGHT 5\' 2"  5\' 2"  5\' 3"   BMI 45.91 kg/m2 45.18 kg/m2 44.43 kg/m2

## 2016-10-01 NOTE — Assessment & Plan Note (Signed)
Tessalon perles and phenergan dM prescribed

## 2016-10-01 NOTE — Assessment & Plan Note (Signed)
Tina Wilkins is reminded of the importance of commitment to daily physical activity for 30 minutes or more, as able and the need to limit carbohydrate intake to 30 to 60 grams per meal to help with blood sugar control.   The need to take medication as prescribed, test blood sugar as directed, and to call between visits if there is a concern that blood sugar is uncontrolled is also discussed.   Tina Wilkins is reminded of the importance of daily foot exam, annual eye examination, and good blood sugar, blood pressure and cholesterol control.  Diabetic Labs Latest Ref Rng & Units 06/09/2016 02/27/2016 10/10/2015 06/06/2015 02/14/2015  HbA1c <5.7 % 6.7(H) 7.1(H) 6.7(H) 6.9(H) 7.3(H)  Microalbumin Not estab mg/dL - 1.3 - - 4.3(H)  Micro/Creat Ratio <30 mcg/mg creat - 7 - - 37.7(H)  Chol 125 - 200 mg/dL - 098162 - 119139 -  HDL >=14>=46 mg/dL - 76 - 71 -  Calc LDL <782<130 mg/dL - 74 - 53 -  Triglycerides <150 mg/dL - 58 - 73 -  Creatinine 0.50 - 0.99 mg/dL 9.560.81 2.130.78 0.860.96 5.780.80 4.690.97   BP/Weight 10/01/2016 09/10/2016 07/14/2016 06/11/2016 05/26/2016 03/28/2016 03/05/2016  Systolic BP 140 - 157 - 136 140 -  Diastolic BP 90 - 97 - 82 90 -  Wt. (Lbs) 251 247 250.8 246.2 247 246 247  BMI 45.91 45.18 44.43 45.02 45.17 43.59 45.17   Foot/eye exam completion dates 02/14/2015 02/02/2014  Foot Form Completion Done Done   Updated lab needed at/ before next visit.

## 2016-10-01 NOTE — Assessment & Plan Note (Signed)
Hyperlipidemia:Low fat diet discussed and encouraged.   Lipid Panel  Lab Results  Component Value Date   CHOL 162 02/27/2016   HDL 76 02/27/2016   LDLCALC 74 02/27/2016   TRIG 58 02/27/2016   CHOLHDL 2.1 02/27/2016   Updated lab needed

## 2016-10-01 NOTE — Progress Notes (Signed)
Tina RoyalsRosetta G Wilkins     MRN: [id number]      DOB: 04/26/1956   HPI Tina Wilkins is here for follow up and re-evaluation of chronic medical conditions, medication management and review of any available recent lab and radiology data.  Preventive health is updated, specifically  Cancer screening and Immunization.    The PT denies any adverse reactions to current medications since the last visit.  1 week h/o worsening head and chest congestion, associated with fever and chills intermittently. Nasal drainage has thickened , and is yellowish green, and at times bloody. Sputum is thick and yellow. C/o bilateral ear pressure, denies hearing loss and sore throat. Increasing fatigue , poor appetitie and sleep disturbed by cough. No improvement with OTC medication. Denies polyuria, polydipsia, blurred vision , or hypoglycemic episodes.  ROS Denies recent fever or chills. Denies sinus pressure, nasal congestion, ear pain or sore throat. Denies chest congestion, productive cough or wheezing. Denies chest pains, palpitations and leg swelling Denies abdominal pain, nausea, vomiting,diarrhea or constipation.   Denies dysuria, frequency, hesitancy or incontinence. Denies joint pain, swelling and limitation in mobility. Denies headaches, seizures, numbness, or tingling. Denies depression, anxiety or insomnia. Denies skin break down or rash.   PE  BP 140/90   Pulse 78   Temp 98.2 F (36.8 C) (Oral)   Ht 5\' 2"  (1.575 m)   Wt 251 lb (113.9 kg)   SpO2 96%   BMI 45.91 kg/m   Patient alert and oriented and in no cardiopulmonary distress.  HEENT: No facial asymmetry, EOMI,   oropharynx pink and moist.  Neck supple no JVD, left anterior cervical adenitis, left maxillary sinus tender  Chest: Clear to auscultation bilaterally.  CVS: S1, S2 no murmurs, no S3.Regular rate.  ABD: Soft non tender.   Ext: No edema  MS: Adequate ROM spine, shoulders, hips and knees.  Skin: Intact, no ulcerations or  rash noted.  Psych: Good eye contact, normal affect. Memory intact not anxious or depressed appearing.  CNS: CN 2-12 intact, power,  normal throughout.no focal deficits noted.   Assessment & Plan  Left maxillary sinusitis levaquin x 1 week  Acute bronchitis Tessalon perles and phenergan dM prescribed  Hypertension goal BP (blood pressure) < 130/80 Uncontrolled, dose increase in maxzide DASH diet and commitment to daily physical activity for a minimum of 30 minutes discussed and encouraged, as a part of hypertension management. The importance of attaining a healthy weight is also discussed.  BP/Weight 10/01/2016 09/10/2016 07/14/2016 06/11/2016 05/26/2016 03/28/2016 03/05/2016  Systolic BP 140 - 157 - 136 140 -  Diastolic BP 90 - 97 - 82 90 -  Wt. (Lbs) 251 247 250.8 246.2 247 246 247  BMI 45.91 45.18 44.43 45.02 45.17 43.59 45.17     Nurse bP re check in 6 weeks  Morbid obesity Deteriorated. Patient re-educated about  the importance of commitment to a  minimum of 150 minutes of exercise per week.  The importance of healthy food choices with portion control discussed. Encouraged to start a food diary, count calories and to consider  joining a support group. Sample diet sheets offered. Goals set by the patient for the next several months.   Weight /BMI 10/01/2016 09/10/2016 07/14/2016  WEIGHT 251 lb 247 lb 250 lb 12.8 oz  HEIGHT 5\' 2"  5\' 2"  5\' 3"   BMI 45.91 kg/m2 45.18 kg/m2 44.43 kg/m2      Hyperlipidemia Hyperlipidemia:Low fat diet discussed and encouraged.   Lipid Panel  Lab Results  Component Value Date   CHOL 162 02/27/2016   HDL 76 02/27/2016   LDLCALC 74 02/27/2016   TRIG 58 02/27/2016   CHOLHDL 2.1 02/27/2016   Updated lab needed      Diabetes mellitus type 2 in obese [facility](HCC) Tina Wilkins is reminded of the importance of commitment to daily physical activity for 30 minutes or more, as able and the need to limit carbohydrate intake to 30 to 60 grams per meal  to help with blood sugar control.   The need to take medication as prescribed, test blood sugar as directed, and to call between visits if there is a concern that blood sugar is uncontrolled is also discussed.   Tina Wilkins is reminded of the importance of daily foot exam, annual eye examination, and good blood sugar, blood pressure and cholesterol control.  Diabetic Labs Latest Ref Rng & Units 06/09/2016 02/27/2016 10/10/2015 06/06/2015 02/14/2015  HbA1c <5.7 % 6.7(H) 7.1(H) 6.7(H) 6.9(H) 7.3(H)  Microalbumin Not estab mg/dL - 1.3 - - 4.3(H)  Micro/Creat Ratio <30 mcg/mg creat - 7 - - 37.7(H)  Chol 125 - 200 mg/dL - 098162 - 119139 -  HDL >=14>=46 mg/dL - 76 - 71 -  Calc LDL <782<130 mg/dL - 74 - 53 -  Triglycerides <150 mg/dL - 58 - 73 -  Creatinine 0.50 - 0.99 mg/dL 9.560.81 2.130.78 0.860.96 5.780.80 4.690.97   BP/Weight 10/01/2016 09/10/2016 07/14/2016 06/11/2016 05/26/2016 03/28/2016 03/05/2016  Systolic BP 140 - 157 - 136 140 -  Diastolic BP 90 - 97 - 82 90 -  Wt. (Lbs) 251 247 250.8 246.2 247 246 247  BMI 45.91 45.18 44.43 45.02 45.17 43.59 45.17   Foot/eye exam completion dates 02/14/2015 02/02/2014  Foot Form Completion Done Done   Updated lab needed at/ before next visit.     Allergic rhinitis Uncontrolled start daily singulair

## 2016-10-01 NOTE — Assessment & Plan Note (Signed)
levaquin x 1 week 

## 2016-10-01 NOTE — Assessment & Plan Note (Signed)
Uncontrolled start daily singulair °

## 2016-10-01 NOTE — Patient Instructions (Addendum)
F/u in 4 month, call if you need me before  Nurse bP check in  6 to 8 weeks  Please work on weight loss, and increased fruit and vegetable in diet  Blood pressure is high, INCREASE triamterene to one and a half daily  Levaquin, tessalon perles and phenergan DM are sent for sinusitis and bronchitis  New for allergies is singulair daily , [place] [facility] [place] today  Thank you  for choosing [facility]. We consider it a privelige to serve you.  Delivering excellent health care in a caring and  compassionate way is our goal.  Partnering with you,  so that together we can achieve this goal is our strategy.

## 2016-10-02 ENCOUNTER — Telehealth: Payer: Self-pay

## 2016-10-02 DIAGNOSIS — E1169 Type 2 diabetes mellitus with other specified complication: Secondary | ICD-10-CM

## 2016-10-02 DIAGNOSIS — E559 Vitamin D deficiency, unspecified: Secondary | ICD-10-CM

## 2016-10-02 DIAGNOSIS — I10 Essential (primary) hypertension: Secondary | ICD-10-CM

## 2016-10-02 DIAGNOSIS — E669 Obesity, unspecified: Secondary | ICD-10-CM

## 2016-10-02 DIAGNOSIS — E785 Hyperlipidemia, unspecified: Secondary | ICD-10-CM

## 2016-10-02 LAB — VITAMIN D 25 HYDROXY (VIT D DEFICIENCY, FRACTURES): VIT D 25 HYDROXY: 28 ng/mL — AB (ref 30–100)

## 2016-10-02 LAB — HEMOGLOBIN A1C
Hgb A1c MFr Bld: 6.8 % — ABNORMAL HIGH (ref <5.7)
Mean Plasma Glucose: 148 mg/dL

## 2016-10-02 NOTE — Telephone Encounter (Signed)
Lab order mailed.

## 2016-10-02 NOTE — Telephone Encounter (Signed)
-----  Message from Fayrene Helper, MD sent at 10/02/2016  5:54 AM EDT ----- Excellent labs, no med changes, add daily vit D3 1000 IU to weekly vit D  Rept non fast HBA1C, chem 7 and EGFr, vit D 1 week prior to next appt

## 2016-10-30 DIAGNOSIS — H401111 Primary open-angle glaucoma, right eye, mild stage: Secondary | ICD-10-CM | POA: Diagnosis not present

## 2016-10-30 DIAGNOSIS — H16223 Keratoconjunctivitis sicca, not specified as Sjogren's, bilateral: Secondary | ICD-10-CM | POA: Diagnosis not present

## 2016-10-30 MED FILL — ALPHAGAN P 0.1% DROPS: 0.1 | 17 days supply | Qty: 5 | Fill #0

## 2016-10-30 MED FILL — METOPROLOL SUCC ER 25 MG TA: 25 | 90 days supply | Qty: 90 | Fill #2

## 2016-11-19 ENCOUNTER — Other Ambulatory Visit: Payer: Self-pay | Admitting: Family Medicine

## 2016-11-19 DIAGNOSIS — Z1231 Encounter for screening mammogram for malignant neoplasm of breast: Secondary | ICD-10-CM

## 2016-11-21 ENCOUNTER — Ambulatory Visit (HOSPITAL_COMMUNITY)
Admission: RE | Admit: 2016-11-21 | Discharge: 2016-11-21 | Disposition: A | Discharge status: Home / Self Care | Payer: 59 | Source: Ambulatory Visit | Attending: Family Medicine | Admitting: Family Medicine

## 2016-11-21 DIAGNOSIS — Z1231 Encounter for screening mammogram for malignant neoplasm of breast: Secondary | ICD-10-CM | POA: Diagnosis not present

## 2016-12-11 ENCOUNTER — Telehealth: Payer: Self-pay | Admitting: Nutrition

## 2016-12-11 ENCOUNTER — Ambulatory Visit: Payer: 59 | Admitting: Nutrition

## 2016-12-11 NOTE — Telephone Encounter (Signed)
VM to call and reschedule missed appt. 

## 2017-01-01 DIAGNOSIS — H40012 Open angle with borderline findings, low risk, left eye: Secondary | ICD-10-CM | POA: Diagnosis not present

## 2017-01-01 DIAGNOSIS — H401111 Primary open-angle glaucoma, right eye, mild stage: Secondary | ICD-10-CM | POA: Diagnosis not present

## 2017-01-01 DIAGNOSIS — H5015 Alternating exotropia: Secondary | ICD-10-CM | POA: Diagnosis not present

## 2017-01-01 LAB — HM DIABETES EYE EXAM

## 2017-01-01 MED FILL — LUMIGAN 0.01% EYE DROPS: 0.01 | 25 days supply | Qty: 3 | Fill #0

## 2017-01-05 MED FILL — VIT D2 1.25 MG (50,000 UNIT: 1.25 MG | 27 days supply | Qty: 4 | Fill #1

## 2017-01-05 MED FILL — cloNIDine HCL 0.3 MG TABS: 0.3 | 30 days supply | Qty: 30 | Fill #1

## 2017-01-05 MED FILL — PANTOPRAZOLE SOD DR 40 MG T: 40 | 30 days supply | Qty: 30 | Fill #1

## 2017-01-05 MED FILL — metFORMIN HCL 1000 MG TABS: 1000 | 34 days supply | Qty: 68 | Fill #1

## 2017-01-05 MED FILL — AMLODIPINE BESYLATE 10 MG T: 10 | 30 days supply | Qty: 30 | Fill #1

## 2017-01-05 MED FILL — BENAZEPRIL HCL 40 MG TABLET: 40 | 30 days supply | Qty: 30 | Fill #1

## 2017-01-05 MED FILL — ROSUVASTATIN CALCIUM 20 MG: 20 | 30 days supply | Qty: 30 | Fill #1

## 2017-01-05 MED FILL — TRIAMTERENE-HCTZ 37.5-25 MG: 37.5-25 | 30 days supply | Qty: 30 | Fill #1

## 2017-01-21 ENCOUNTER — Telehealth: Payer: Self-pay

## 2017-01-21 NOTE — Telephone Encounter (Signed)
Called and left message for patient to return call.   Noted that patient is scheduled for office visit 3/1

## 2017-01-28 LAB — HEMOGLOBIN A1C
HEMOGLOBIN A1C: 7.3 % — AB (ref <5.7)
Mean Plasma Glucose: 163 mg/dL

## 2017-01-28 LAB — BASIC METABOLIC PANEL WITH GFR
BUN: 15 mg/dL (ref 7–25)
CO2: 31 mmol/L (ref 20–31)
Calcium: 9.6 mg/dL (ref 8.6–10.4)
Chloride: 102 mmol/L (ref 98–110)
Creat: 0.94 mg/dL (ref 0.50–0.99)
GFR, EST AFRICAN AMERICAN: 75 mL/min (ref >=60)
GFR, Est Non African American: 65 mL/min (ref >=60)
GLUCOSE: 110 mg/dL — AB (ref 65–99)
POTASSIUM: 3.8 mmol/L (ref 3.5–5.3)
Sodium: 142 mmol/L (ref 135–146)

## 2017-01-28 LAB — VITAMIN D 25 HYDROXY (VIT D DEFICIENCY, FRACTURES): VIT D 25 HYDROXY: 39 ng/mL (ref 30–100)

## 2017-01-29 ENCOUNTER — Encounter: Payer: Self-pay | Admitting: Family Medicine

## 2017-01-29 ENCOUNTER — Ambulatory Visit (INDEPENDENT_AMBULATORY_CARE_PROVIDER_SITE_OTHER): Payer: 59 | Admitting: Family Medicine

## 2017-01-29 VITALS — BP 130/84 | HR 82 | Resp 15 | Ht 62.0 in | Wt 251.0 lb

## 2017-01-29 DIAGNOSIS — I10 Essential (primary) hypertension: Secondary | ICD-10-CM

## 2017-01-29 DIAGNOSIS — E1169 Type 2 diabetes mellitus with other specified complication: Secondary | ICD-10-CM | POA: Diagnosis not present

## 2017-01-29 DIAGNOSIS — K219 Gastro-esophageal reflux disease without esophagitis: Secondary | ICD-10-CM

## 2017-01-29 DIAGNOSIS — L040 Acute lymphadenitis of face, head and neck: Secondary | ICD-10-CM

## 2017-01-29 DIAGNOSIS — E669 Obesity, unspecified: Secondary | ICD-10-CM

## 2017-01-29 DIAGNOSIS — E7849 Other hyperlipidemia: Secondary | ICD-10-CM

## 2017-01-29 DIAGNOSIS — E784 Other hyperlipidemia: Secondary | ICD-10-CM

## 2017-01-29 MED ORDER — ROSUVASTATIN CALCIUM 20 MG PO TABS: 20.0000 mg | ORAL_TABLET | Freq: Every day | ORAL | 1 refills | Status: DC

## 2017-01-29 MED ORDER — AMLODIPINE BESYLATE 10 MG PO TABS: 10.0000 mg | ORAL_TABLET | Freq: Every day | ORAL | 1 refills | Status: DC

## 2017-01-29 MED ORDER — AZELASTINE HCL 0.1 % NA SOLN: 2.0000 | Freq: Two times a day (BID) | NASAL | 12 refills | Status: DC

## 2017-01-29 MED ORDER — MONTELUKAST SODIUM 10 MG PO TABS: 10.0000 mg | ORAL_TABLET | Freq: Every day | ORAL | 1 refills | Status: DC

## 2017-01-29 MED ORDER — VITAMIN D (ERGOCALCIFEROL) 1.25 MG (50000 UNIT) PO CAPS: ORAL_CAPSULE | ORAL | 1 refills | Status: DC

## 2017-01-29 MED ORDER — CEPHALEXIN 500 MG PO CAPS
500.0000 mg | ORAL_CAPSULE | Freq: Two times a day (BID) | ORAL | 0 refills | Status: AC
Start: 2017-01-29 — End: 2017-02-05

## 2017-01-29 MED ORDER — PANTOPRAZOLE SODIUM 40 MG PO TBEC: 40.0000 mg | DELAYED_RELEASE_TABLET | Freq: Every day | ORAL | 1 refills | Status: DC

## 2017-01-29 MED ORDER — BENAZEPRIL HCL 40 MG PO TABS: 40.0000 mg | ORAL_TABLET | Freq: Every day | ORAL | 1 refills | Status: DC

## 2017-01-29 MED ORDER — CLONIDINE HCL 0.3 MG PO TABS: 0.3000 mg | ORAL_TABLET | Freq: Every day | ORAL | 1 refills | Status: DC

## 2017-01-29 MED FILL — AMLODIPINE BESYLATE 10 MG T: 10 | 30 days supply | Qty: 30 | Fill #0

## 2017-01-29 MED FILL — cloNIDine HCL 0.3 MG TABS: 0.3 | 30 days supply | Qty: 30 | Fill #0

## 2017-01-29 MED FILL — PANTOPRAZOLE SOD DR 40 MG T: 40 | 30 days supply | Qty: 30 | Fill #0

## 2017-01-29 MED FILL — VIT D2 1.25 MG (50,000 UNIT: 1.25 MG | 28 days supply | Qty: 4 | Fill #0

## 2017-01-29 MED FILL — BENAZEPRIL HCL 40 MG TABLET: 40 | 30 days supply | Qty: 30 | Fill #0

## 2017-01-29 MED FILL — MONTELUKAST SOD 10 MG TAB: 10 | 30 days supply | Qty: 30 | Fill #0

## 2017-01-29 MED FILL — ROSUVASTATIN CALCIUM 20 MG: 20 | 30 days supply | Qty: 30 | Fill #0

## 2017-01-29 NOTE — Patient Instructions (Signed)
Welcome to medicare in May, call if you need me sooner  You are treated for acute cervical adenitis, 1 week antibiotic course prescribed  Please cut back starchy foods and sweets, eat more vegetable, blood sugar has increased  No labs due for next visit It is important that you exercise regularly at least 30 minutes 5 times a week. If you develop chest pain, have severe difficulty breathing, or feel very tired, stop exercising immediately and seek medical attention    Please work on good  health habits so that your health will improve. 1. Commitment to daily physical activity for 30 to 60  minutes, if you are able to do this.  2. Commitment to wise food choices. Aim for half of your  food intake to be vegetable and fruit, one quarter starchy foods, and one quarter protein. Try to eat on a regular schedule  3 meals per day, snacking between meals should be limited to vegetables or fruits or small portions of nuts. 64 ounces of water per day is generally recommended, unless you have specific health conditions, like heart failure or kidney failure where you will need to limit fluid intake.  3. Commitment to sufficient and a  good quality of physical and mental rest daily, generally between 6 to 8 hours per day.  WITH PERSISTANCE AND PERSEVERANCE, THE IMPOSSIBLE , BECOMES THE NORM!

## 2017-01-29 NOTE — Assessment & Plan Note (Signed)
1 week history, antibiotic course prescribed

## 2017-01-29 NOTE — Progress Notes (Signed)
Tina Wilkins     MRN: [id number]      DOB: 07/28/1956   HPI Tina Wilkins is here for follow up and re-evaluation of chronic medical conditions, medication management and review of any available recent lab and radiology data.  Preventive health is updated, specifically  Cancer screening and Immunization.   Questions or concerns regarding consultations or procedures which the PT has had in the interim are  addressed. C/o left facial pain and pain in roof of  Mouth on left side, mentions also red spot on tongue all of which developed after she was eating nabs/ crackers and heard a pop in the mouth, developed 6 days ago. States had fever from 102 to 103.2 1 week ago , last Wednesaday and Thursday , fever  Some chills but denies respiratory, GI or GU symptoms  Denies polyuria, polydipsia, blurred vision , or hypoglycemic episodes.  ROS Denies chest congestion, productive cough or wheezing. Denies chest pains, palpitations and leg swelling Denies abdominal pain, nausea, vomiting,diarrhea or constipation.   Denies dysuria, frequency, hesitancy or incontinence. Denies joint pain, swelling and limitation in mobility. Denies headaches, seizures, numbness, or tingling. Denies depression, anxiety or insomnia. Denies skin break down or rash.   PE  BP 130/84   Pulse 82   Resp 15   Ht 5\' 2"  (1.575 m)   Wt 251 lb (113.9 kg)   SpO2 96%   BMI 45.91 kg/m   Patient alert and oriented and in no cardiopulmonary distress.  HEENT: No facial asymmetry, EOMI,   oropharynx pink and moist.  Neck supple, left anterior cervical adenitis,  no JVD, no mass.  Chest: Clear to auscultation bilaterally.  CVS: S1, S2 no murmurs, no S3.Regular rate.  ABD: Soft non tender.   Ext: No edema  MS: Adequate ROM spine, shoulders, hips and knees.  Skin: Intact, no ulcerations or rash noted.  Psych: Good eye contact, normal affect. Memory intact not anxious or depressed appearing.  CNS: CN 2-12 intact, power,   normal throughout.no focal deficits noted.   Assessment & Plan  Acute cervical adenitis 1 week history, antibiotic course prescribed  Diabetes mellitus type 2 in obese [facility](HCC) Not at goal, and deteriorated, no med change Tina Wilkins is reminded of the importance of commitment to daily physical activity for 30 minutes or more, as able and the need to limit carbohydrate intake to 30 to 60 grams per meal to help with blood sugar control.   The need to take medication as prescribed, test blood sugar as directed, and to call between visits if there is a concern that blood sugar is uncontrolled is also discussed.   Tina Wilkins is reminded of the importance of daily foot exam, annual eye examination, and good blood sugar, blood pressure and cholesterol control.  Diabetic Labs Latest Ref Rng & Units 01/27/2017 10/01/2016 06/09/2016 02/27/2016 10/10/2015  HbA1c <5.7 % 7.3(H) 6.8(H) 6.7(H) 7.1(H) 6.7(H)  Microalbumin Not estab mg/dL - - - 1.3 -  Micro/Creat Ratio <30 mcg/mg creat - - - 7 -  Chol 125 - 200 mg/dL - 409169 - 811162 -  HDL >=91>=46 mg/dL - 74 - 76 -  Calc LDL <478<130 mg/dL - 81 - 74 -  Triglycerides <150 mg/dL - 68 - 58 -  Creatinine 0.50 - 0.99 mg/dL 2.950.94 6.21(H1.01(H) 0.860.81 5.780.78 0.96   BP/Weight 01/29/2017 10/01/2016 09/10/2016 07/14/2016 06/11/2016 05/26/2016 03/28/2016  Systolic BP 130 140 - 157 - 136 140  Diastolic BP 84 90 - 97 -  82 90  Wt. (Lbs) 251 251 247 250.8 246.2 247 246  BMI 45.91 45.91 45.18 44.43 45.02 45.17 43.59   Foot/eye exam completion dates 02/14/2015 02/02/2014  Foot Form Completion Done Done        Hypertension goal BP (blood pressure) < 130/80 Controlled, no change in medication DASH diet and commitment to daily physical activity for a minimum of 30 minutes discussed and encouraged, as a part of hypertension management. The importance of attaining a healthy weight is also discussed.  BP/Weight 01/29/2017 10/01/2016 09/10/2016 07/14/2016 06/11/2016 05/26/2016 03/28/2016  Systolic BP 130  914 - 157 - 136 140  Diastolic BP 84 90 - 97 - 82 90  Wt. (Lbs) 251 251 247 250.8 246.2 247 246  BMI 45.91 45.91 45.18 44.43 45.02 45.17 43.59       Morbid obesity Unchanged Patient re-educated about  the importance of commitment to a  minimum of 150 minutes of exercise per week.  The importance of healthy food choices with portion control discussed. Encouraged to start a food diary, count calories and to consider  joining a support group. Sample diet sheets offered. Goals set by the patient for the next several months.   Weight /BMI 01/29/2017 10/01/2016 09/10/2016  WEIGHT 251 lb 251 lb 247 lb  HEIGHT 5\' 2"  5\' 2"  5\' 2"   BMI 45.91 kg/m2 45.91 kg/m2 45.18 kg/m2      Hyperlipidemia Hyperlipidemia:Low fat diet discussed and encouraged.   Lipid Panel  Lab Results  Component Value Date   CHOL 169 10/01/2016   HDL 74 10/01/2016   LDLCALC 81 10/01/2016   TRIG 68 10/01/2016   CHOLHDL 2.3 10/01/2016   Controlled, no change in medication     GERD (gastroesophageal reflux disease) Controlled, no change in medication

## 2017-01-31 NOTE — Assessment & Plan Note (Signed)
Unchanged Patient re-educated about  the importance of commitment to a  minimum of 150 minutes of exercise per week.  The importance of healthy food choices with portion control discussed. Encouraged to start a food diary, count calories and to consider  joining a support group. Sample diet sheets offered. Goals set by the patient for the next several months.   Weight /BMI 01/29/2017 10/01/2016 09/10/2016  WEIGHT 251 lb 251 lb 247 lb  HEIGHT 5\' 2"  5\' 2"  5\' 2"   BMI 45.91 kg/m2 45.91 kg/m2 45.18 kg/m2

## 2017-01-31 NOTE — Assessment & Plan Note (Signed)
Controlled, no change in medication  

## 2017-01-31 NOTE — Assessment & Plan Note (Signed)
Hyperlipidemia:Low fat diet discussed and encouraged.   Lipid Panel  Lab Results  Component Value Date   CHOL 169 10/01/2016   HDL 74 10/01/2016   LDLCALC 81 10/01/2016   TRIG 68 10/01/2016   CHOLHDL 2.3 10/01/2016   Controlled, no change in medication

## 2017-01-31 NOTE — Assessment & Plan Note (Signed)
Controlled, no change in medication DASH diet and commitment to daily physical activity for a minimum of 30 minutes discussed and encouraged, as a part of hypertension management. The importance of attaining a healthy weight is also discussed.  BP/Weight 01/29/2017 10/01/2016 09/10/2016 07/14/2016 06/11/2016 05/26/2016 03/28/2016  Systolic BP 130 140 - 157 - 136 140  Diastolic BP 84 90 - 97 - 82 90  Wt. (Lbs) 251 251 247 250.8 246.2 247 246  BMI 45.91 45.91 45.18 44.43 45.02 45.17 43.59

## 2017-01-31 NOTE — Assessment & Plan Note (Signed)
Not at goal, and deteriorated, no med change [name] is reminded of the importance of commitment to daily physical activity for 30 minutes or more, as able and the need to limit carbohydrate intake to 30 to 60 grams per meal to help with blood sugar control.   The need to take medication as prescribed, test blood sugar as directed, and to call between visits if there is a concern that blood sugar is uncontrolled is also discussed.   [name] is reminded of the importance of daily foot exam, annual eye examination, and good blood sugar, blood pressure and cholesterol control.  Diabetic Labs Latest Ref Rng & Units 01/27/2017 10/01/2016 06/09/2016 02/27/2016 10/10/2015  HbA1c <5.7 % 7.3(H) 6.8(H) 6.7(H) 7.1(H) 6.7(H)  Microalbumin Not estab mg/dL - - - 1.3 -  Micro/Creat Ratio <30 mcg/mg creat - - - 7 -  Chol 125 - 200 mg/dL - 914169 - 782162 -  HDL >=95>=46 mg/dL - 74 - 76 -  Calc LDL <621<130 mg/dL - 81 - 74 -  Triglycerides <150 mg/dL - 68 - 58 -  Creatinine 0.50 - 0.99 mg/dL 3.080.94 6.57(Q1.01(H) 4.690.81 6.290.78 0.96   BP/Weight 01/29/2017 10/01/2016 09/10/2016 07/14/2016 06/11/2016 05/26/2016 03/28/2016  Systolic BP 130 140 - 157 - 136 140  Diastolic BP 84 90 - 97 - 82 90  Wt. (Lbs) 251 251 247 250.8 246.2 247 246  BMI 45.91 45.91 45.18 44.43 45.02 45.17 43.59   Foot/eye exam completion dates 02/14/2015 02/02/2014  Foot Form Completion Done Done

## 2017-02-05 ENCOUNTER — Other Ambulatory Visit: Payer: Self-pay | Admitting: Family Medicine

## 2017-02-05 MED FILL — TRUE METRIX GLUCOSE TEST ST: 25 days supply | Qty: 25 | Fill #0

## 2017-02-06 ENCOUNTER — Other Ambulatory Visit: Payer: Self-pay

## 2017-02-06 MED ORDER — GLUCOSE BLOOD VI STRP: ORAL_STRIP | 12 refills | Status: DC

## 2017-02-06 NOTE — Progress Notes (Signed)
Rue track

## 2017-02-11 MED FILL — METOPROLOL SUCC ER 25 MG TA: 25 | 30 days supply | Qty: 30 | Fill #3

## 2017-03-16 ENCOUNTER — Other Ambulatory Visit: Payer: Self-pay | Admitting: Family Medicine

## 2017-03-16 MED FILL — TRIAMTERENE-HCTZ 37.5-25 MG: 37.5-25 | 30 days supply | Qty: 30 | Fill #2

## 2017-03-16 MED FILL — VIT D2 1.25 MG (50,000 UNIT: 1.25 MG | 28 days supply | Qty: 4 | Fill #2

## 2017-03-16 MED FILL — METHOCARBAMOL 500 MG TABLET: 500 | 30 days supply | Qty: 30 | Fill #0

## 2017-03-16 MED FILL — AMLODIPINE BESYLATE 10 MG T: 10 | 30 days supply | Qty: 30 | Fill #2

## 2017-03-16 MED FILL — BENAZEPRIL HCL 40 MG TABLET: 40 | 30 days supply | Qty: 30 | Fill #2

## 2017-03-16 MED FILL — ROSUVASTATIN CALCIUM 20 MG: 20 | 30 days supply | Qty: 30 | Fill #1

## 2017-03-16 MED FILL — METOPROLOL SUCC ER 25 MG TA: 25 | 30 days supply | Qty: 30 | Fill #4

## 2017-03-16 MED FILL — cloNIDine HCL 0.3 MG TABS: 0.3 | 30 days supply | Qty: 30 | Fill #1

## 2017-04-01 ENCOUNTER — Ambulatory Visit (INDEPENDENT_AMBULATORY_CARE_PROVIDER_SITE_OTHER): Payer: 59

## 2017-04-01 VITALS — BP 132/88 | HR 69 | Temp 98.5°F | Ht 62.0 in | Wt 250.1 lb

## 2017-04-01 DIAGNOSIS — E669 Obesity, unspecified: Secondary | ICD-10-CM

## 2017-04-01 DIAGNOSIS — E1169 Type 2 diabetes mellitus with other specified complication: Secondary | ICD-10-CM | POA: Diagnosis not present

## 2017-04-01 DIAGNOSIS — Z0000 Encounter for general adult medical examination without abnormal findings: Secondary | ICD-10-CM | POA: Diagnosis not present

## 2017-04-01 MED ORDER — GLUCOSE BLOOD VI STRP: ORAL_STRIP | 4 refills | Status: DC

## 2017-04-01 MED FILL — TRUE METRIX GLUCOSE TEST ST: 25 days supply | Qty: 25 | Fill #0

## 2017-04-01 NOTE — Progress Notes (Signed)
Subjective:   Tina Wilkins is a 61 y.o. female who presents for an Initial Medicare Annual Wellness Visit.  Review of Systems:  Cardiac Risk Factors include: diabetes mellitus;dyslipidemia;hypertension;obesity (BMI >30kg/m2)     Objective:    Today's Vitals   04/01/17 1028  BP: 132/88  Pulse: 69  Temp: 98.5 F (36.9 C)  TempSrc: Oral  SpO2: 95%  Weight: 250 lb 1.9 oz (113.5 kg)  Height:  (1.575 m)   Body mass index is 45.75 kg/m.   Current Medications (verified) Outpatient Encounter Prescriptions as of 04/01/2017  Medication Sig  . acetaminophen (TYLENOL) 500 MG tablet Take 500 mg by mouth every 6 (six) hours as needed.  . Alcohol Swabs (ALCOHOL PREPS) PADS Use as directed when testing blood glucose  . amLODipine (NORVASC) 10 MG tablet Take 1 tablet (10 mg total) by mouth daily.  Marland Kitchen aspirin 81 MG tablet Take 1 tablet (81 mg total) by mouth daily.  . benazepril (LOTENSIN) 40 MG tablet Take 1 tablet (40 mg total) by mouth daily.  . calcium-vitamin D (OSCAL WITH D) 500-200 MG-UNIT per tablet Take 1 tablet by mouth 2 (two) times daily.  . cloNIDine (CATAPRES) 0.3 MG tablet Take 1 tablet (0.3 mg total) by mouth at bedtime.  . fluticasone (FLONASE) 50 MCG/ACT nasal spray Place 2 sprays into both nostrils daily.  Marland Kitchen glucose blood (TRUETRACK TEST) test strip USE AS INSTRUCTED FOR ONCE DAILY TESTING  . LUMIGAN 0.01 % SOLN Place 1 drop into both eyes daily.  . methocarbamol (ROBAXIN) 500 MG tablet TAKE 1 TABLET BY MOUTH AT BEDTIME AS NEEDED FOR BACK PAIN AND SPASMS  . metoprolol succinate (TOPROL XL) 25 MG 24 hr tablet Take 1 tablet (25 mg total) by mouth daily.  . montelukast (SINGULAIR) 10 MG tablet Take 1 tablet (10 mg total) by mouth at bedtime.  . pantoprazole (PROTONIX) 40 MG tablet Take 1 tablet (40 mg total) by mouth daily. (Patient taking differently: Take 40 mg by mouth daily as needed. )  . rosuvastatin (CRESTOR) 20 MG tablet Take 1 tablet (20 mg total) by mouth  daily.  Marland Kitchen triamterene-hydrochlorothiazide (MAXZIDE-25) 37.5-25 MG tablet One and a half tablets once daily  . TRUEPLUS LANCETS 30G MISC USE TO TEST BLOOD SUGAR ONCE A DAY  . Vitamin D, Ergocalciferol, (DRISDOL) 50000 units CAPS capsule TAKE 1 CAPSULE (50,000 UNITS) BY MOUTH ONCE A WEEK.  Marland Kitchen azelastine (ASTELIN) 0.1 % nasal spray Place 2 sprays into both nostrils 2 (two) times daily. Use in each nostril as directed (Patient not taking: Reported on 04/01/2017)  . glucose blood test strip Use as instructed  . INVOKANA 300 MG TABS tablet TAKE 1 TABLET BY MOUTH DAILY (Patient not taking: Reported on 04/01/2017)  . metFORMIN (GLUCOPHAGE) 1000 MG tablet TAKE 1 TABLET BY MOUTH 2 TIMES DAILY WITH A MEAL. (Patient not taking: Reported on 04/01/2017)  . [DISCONTINUED] glucose blood (TRUETRACK TEST) test strip Use as instructed once daily testing dx E11.9  . [DISCONTINUED] methocarbamol (ROBAXIN) 500 MG tablet TAKE 1 TABLET BY MOUTH AT BEDTIME AS NEEDED FOR BACK PAIN AND SPASMS  . [DISCONTINUED] promethazine-dextromethorphan (PROMETHAZINE-DM) 6.25-15 MG/5ML syrup One teaspoon at bedtime , as needed, for excessive cough (Patient not taking: Reported on 04/01/2017)   No facility-administered encounter medications on file as of 04/01/2017.     Allergies (verified) Celecoxib; Nolamine [chlorphen-phenind-phenylprop]; Sitagliptin-metformin hcl; and Skelaxin   History: Past Medical History:  Diagnosis Date  . Essential hypertension 2005  . Hyperlipidemia 2005  .  Obesity   . Seasonal allergies   . Type 2 diabetes mellitus (HCC) 2010   Past Surgical History:  Procedure Laterality Date  . ABDOMINAL HYSTERECTOMY  2004   Secondary to fibroids, single ovary left  . CESAREAN SECTION  1989   2 vaginal deliveries before  . SHOULDER SURGERY Left    Family History  Problem Relation Age of Onset  . Heart disease Mother   . Stroke Mother   . Heart disease Father   . Stroke Father   . Diabetes Father   . Diabetes  Sister   . Heart disease Sister   . Lymphoma Sister   . Cancer Brother   . Heart disease Brother   . Cancer Brother     unknown type  . Lung disease Brother   . Heart disease Brother   . Liver cancer Brother   . Lung cancer Brother   . Liver cancer Brother   . Lung cancer Brother   . Seizures Son    Social History   Occupational History  . Not on file.   Social History Main Topics  . Smoking status: Never Smoker  . Smokeless tobacco: Never Used  . Alcohol use No  . Drug use: No  . Sexual activity: Yes    Birth control/ protection: Surgical    Tobacco Counseling Counseling given: Not Answered   Activities of Daily Living In your present state of health, do you have any difficulty performing the following activities: 04/01/2017  Hearing? N  Vision? N  Difficulty concentrating or making decisions? N  Walking or climbing stairs? N  Dressing or bathing? N  Doing errands, shopping? N  Preparing Food and eating ? N  Using the Toilet? N  In the past six months, have you accidently leaked urine? N  Do you have problems with loss of bowel control? N  Managing your Medications? N  Managing your Finances? N  Housekeeping or managing your Housekeeping? N  Some recent data might be hidden    Immunizations and Health Maintenance Immunization History  Administered Date(s) Administered  . Influenza Split 09/11/2014  . Influenza Whole 10/06/2006, 08/22/2009  . Pneumococcal Conjugate-13 02/14/2015  . Pneumococcal Polysaccharide-23 09/22/2013  . Tdap 01/26/2013  . Zoster 10/18/2014   Health Maintenance Due  Topic Date Due  . FOOT EXAM  02/26/2017    Patient Care Team: Kerri Perches, MD as PCP - General Jeani Hawking, MD as Consulting Physician (Gastroenterology) Chalmers Guest, MD as Consulting Physician (Ophthalmology) Vickki Hearing, MD as Consulting Physician (Orthopedic Surgery)  Indicate any recent Medical Services you may have received from other than  [facility] providers in the past year (date may be approximate).     Assessment:   This is a routine wellness examination for [name].  Hearing/Vision screen  Visual Acuity Screening   Right eye Left eye Both eyes  Without correction:     With correction:  Comments: Continue follow up with [facility]   Dietary issues and exercise activities discussed: Current Exercise Habits: Home exercise routine, Type of exercise: walking, Time (Minutes): 10, Frequency (Times/Week): 3, Weekly Exercise (Minutes/Week): 30, Intensity: Mild  Goals    . Exercise 3x per week (30 min per time)          Obese, recommend increasing your exercise program to 30-45 minutes at least 3 days a week as tolerated.        Depression Screen PHQ 2/9 Scores 04/01/2017 01/29/2017 12/05/2015 09/05/2015 07/25/2015 02/14/2015 02/02/2014  PHQ - 2 Score 0 0 0 0 0 0 2  PHQ- 9 Score - - - - - 3 5    Fall Risk Fall Risk  04/01/2017 01/29/2017 12/05/2015  Falls in the past year? No No No    Cognitive Function: Normal   6CIT Screen 04/01/2017  What Year? 0 points  What month? 0 points  What time? 0 points  Count back from 20 0 points  Months in reverse 0 points  Repeat phrase 0 points  Total Score 0    Screening Tests Health Maintenance  Topic Date Due  . FOOT EXAM  02/26/2017  . INFLUENZA VACCINE  07/01/2017  . HEMOGLOBIN A1C  07/27/2017  . OPHTHALMOLOGY EXAM  01/01/2018  . PNEUMOCOCCAL POLYSACCHARIDE VACCINE (2) 09/22/2018  . MAMMOGRAM  11/21/2018  . PAP SMEAR  05/27/2019  . TETANUS/TDAP  01/26/2023  . COLONOSCOPY  08/01/2025  . Hepatitis C Screening  Completed  . HIV Screening  Completed      Plan:  I have personally reviewed and noted the following in the patient's chart:   . Medical and social history . Use of alcohol, tobacco or illicit drugs  . Current medications and supplements . Functional ability and status . Nutritional status . Physical activity . Advanced directives . List of other  physicians . Hospitalizations, surgeries, and ER visits in previous 12 months . Vitals . Screenings to include cognitive, depression, and falls . Referrals and appointments  In addition, I have reviewed and discussed with patient certain preventive protocols, quality metrics, and best practice recommendations. A written personalized care plan for preventive services as well as general preventive health recommendations were provided to patient.     Candis Shine, LPN   [id number]

## 2017-04-01 NOTE — Patient Instructions (Addendum)
[name] , Thank you for taking time to come for your Medicare Wellness Visit. I appreciate your ongoing commitment to your health goals. Please review the following plan we discussed and let me know if I can assist you in the future.   Screening recommendations/referrals: Colonoscopy: Up to date, next due 08/2020 Mammogram: Up to date, next due 10/2017 Bone Density: Up to date Diabetic Eye Exam: Up to date, will request records from Dr. Venetia Maxon Recommended yearly dental visit for hygiene and checkup  Vaccinations: Influenza vaccine: Up to date, next due 07/2017 Pneumococcal vaccine: Up to date, next due at age 38 Tdap vaccine: Up to date, next due 01/2023 Shingles vaccine: Done    Advanced directives:Please bring a copy of your POA (Power of [place]) and/or Living Will to your next appointment.   Conditions/risks identified: Obese, recommend increasing your current exercise program at least 3 days a week for 30-45 minutes at a time as tolerated.   Next appointment: Follow up with Dr. Moshe Cipro in Early July for your complete physical. Follow up in 1 year for your annual wellness visit.  Preventive Care 40-64 Years, Female Preventive care refers to lifestyle choices and visits with your health care provider that can promote health and wellness. What does preventive care include?  A yearly physical exam. This is also called an annual well check.  Dental exams once or twice a year.  Routine eye exams. Ask your health care provider how often you should have your eyes checked.  Personal lifestyle choices, including:  Daily care of your teeth and gums.  Regular physical activity.  Eating a healthy diet.  Avoiding tobacco and drug use.  Limiting alcohol use.  Practicing safe sex.  Taking low-dose aspirin daily starting at age 90.  Taking vitamin and mineral supplements as recommended by your health care provider. What happens during an annual well check? The services and  screenings done by your health care provider during your annual well check will depend on your age, overall health, lifestyle risk factors, and family history of disease. Counseling  Your health care provider may ask you questions about your:  Alcohol use.  Tobacco use.  Drug use.  Emotional well-being.  Home and relationship well-being.  Sexual activity.  Eating habits.  Work and work Statistician.  Method of birth control.  Menstrual cycle.  Pregnancy history. Screening  You may have the following tests or measurements:  Height, weight, and BMI.  Blood pressure.  Lipid and cholesterol levels. These may be checked every 5 years, or more frequently if you are over 51 years old.  Skin check.  Lung cancer screening. You may have this screening every year starting at age 66 if you have a 30-pack-year history of smoking and currently smoke or have quit within the past 15 years.  Fecal occult blood test (FOBT) of the stool. You may have this test every year starting at age 62.  Flexible sigmoidoscopy or colonoscopy. You may have a sigmoidoscopy every 5 years or a colonoscopy every 10 years starting at age 75.  Hepatitis C blood test.  Hepatitis B blood test.  Sexually transmitted disease (STD) testing.  Diabetes screening. This is done by checking your blood sugar (glucose) after you have not eaten for a while (fasting). You may have this done every 1-3 years.  Mammogram. This may be done every 1-2 years. Talk to your health care provider about when you should start having regular mammograms. This may depend on whether you have a  family history of breast cancer.  BRCA-related cancer screening. This may be done if you have a family history of breast, ovarian, tubal, or peritoneal cancers.  Pelvic exam and Pap test. This may be done every 3 years starting at age 68. Starting at age 57, this may be done every 5 years if you have a Pap test in combination with an HPV  test.  Bone density scan. This is done to screen for osteoporosis. You may have this scan if you are at high risk for osteoporosis. Discuss your test results, treatment options, and if necessary, the need for more tests with your health care provider. Vaccines  Your health care provider may recommend certain vaccines, such as:  Influenza vaccine. This is recommended every year.  Tetanus, diphtheria, and acellular pertussis (Tdap, Td) vaccine. You may need a Td booster every 10 years.  Zoster vaccine. You may need this after age 88.  Pneumococcal 13-valent conjugate (PCV13) vaccine. You may need this if you have certain conditions and were not previously vaccinated.  Pneumococcal polysaccharide (PPSV23) vaccine. You may need one or two doses if you smoke cigarettes or if you have certain conditions. Talk to your health care provider about which screenings and vaccines you need and how often you need them. This information is not intended to replace advice given to you by your health care provider. Make sure you discuss any questions you have with your health care provider. Document Released: 12/14/2015 Document Revised: 08/06/2016 Document Reviewed: 09/18/2015 Elsevier Interactive Patient Education  2017 [place] Prevention in the Home Falls can cause injuries. They can happen to people of all ages. There are many things you can do to make your home safe and to help prevent falls. What can I do on the outside of my home?  Regularly fix the edges of walkways and driveways and fix any cracks.  Remove anything that might make you trip as you walk through a door, such as a raised step or threshold.  Trim any bushes or trees on the path to your home.  Use bright outdoor lighting.  Clear any walking paths of anything that might make someone trip, such as rocks or tools.  Regularly check to see if handrails are loose or broken. Make sure that both sides of any steps have  handrails.  Any raised decks and porches should have guardrails on the edges.  Have any leaves, snow, or ice cleared regularly.  Use sand or salt on walking paths during winter.  Clean up any spills in your garage right away. This includes oil or grease spills. What can I do in the bathroom?  Use night lights.  Install grab bars by the toilet and in the tub and shower. Do not use towel bars as grab bars.  Use non-skid mats or decals in the tub or shower.  If you need to sit down in the shower, use a plastic, non-slip stool.  Keep the floor dry. Clean up any water that spills on the floor as soon as it happens.  Remove soap buildup in the tub or shower regularly.  Attach bath mats securely with double-sided non-slip rug tape.  Do not have throw rugs and other things on the floor that can make you trip. What can I do in the bedroom?  Use night lights.  Make sure that you have a light by your bed that is easy to reach.  Do not use any sheets or blankets that are  too big for your bed. They should not hang down onto the floor.  Have a firm chair that has side arms. You can use this for support while you get dressed.  Do not have throw rugs and other things on the floor that can make you trip. What can I do in the kitchen?  Clean up any spills right away.  Avoid walking on wet floors.  Keep items that you use a lot in easy-to-reach places.  If you need to reach something above you, use a strong step stool that has a grab bar.  Keep electrical cords out of the way.  Do not use floor polish or wax that makes floors slippery. If you must use wax, use non-skid floor wax.  Do not have throw rugs and other things on the floor that can make you trip. What can I do with my stairs?  Do not leave any items on the stairs.  Make sure that there are handrails on both sides of the stairs and use them. Fix handrails that are broken or loose. Make sure that handrails are as long as  the stairways.  Check any carpeting to make sure that it is firmly attached to the stairs. Fix any carpet that is loose or worn.  Avoid having throw rugs at the top or bottom of the stairs. If you do have throw rugs, attach them to the floor with carpet tape.  Make sure that you have a light switch at the top of the stairs and the bottom of the stairs. If you do not have them, ask someone to add them for you. What else can I do to help prevent falls?  Wear shoes that:  Do not have high heels.  Have rubber bottoms.  Are comfortable and fit you well.  Are closed at the toe. Do not wear sandals.  If you use a stepladder:  Make sure that it is fully opened. Do not climb a closed stepladder.  Make sure that both sides of the stepladder are locked into place.  Ask someone to hold it for you, if possible.  Clearly mark and make sure that you can see:  Any grab bars or handrails.  First and last steps.  Where the edge of each step is.  Use tools that help you move around (mobility aids) if they are needed. These include:  Canes.  Walkers.  Scooters.  Crutches.  Turn on the lights when you go into a dark area. Replace any light bulbs as soon as they burn out.  Set up your furniture so you have a clear path. Avoid moving your furniture around.  If any of your floors are uneven, fix them.  If there are any pets around you, be aware of where they are.  Review your medicines with your doctor. Some medicines can make you feel dizzy. This can increase your chance of falling. Ask your doctor what other things that you can do to help prevent falls. This information is not intended to replace advice given to you by your health care provider. Make sure you discuss any questions you have with your health care provider. Document Released: 09/13/2009 Document Revised: 04/24/2016 Document Reviewed: 12/22/2014 Elsevier Interactive Patient Education  2017 [employer].

## 2017-04-13 ENCOUNTER — Telehealth: Payer: Self-pay

## 2017-04-13 NOTE — Telephone Encounter (Signed)
lmom pt had question about her bill.  I gave her the number to pt billing [phone].

## 2017-04-16 ENCOUNTER — Other Ambulatory Visit: Payer: Self-pay | Admitting: Cardiology

## 2017-04-16 MED FILL — cloNIDine HCL 0.3 MG TABS: 0.3 | 30 days supply | Qty: 30 | Fill #2

## 2017-04-16 MED FILL — METHOCARBAMOL 500 MG TABLET: 500 | 30 days supply | Qty: 30 | Fill #1

## 2017-04-16 MED FILL — ROSUVASTATIN CALCIUM 20 MG: 20 | 30 days supply | Qty: 30 | Fill #2

## 2017-04-16 MED FILL — RESTASIS MULTIDOSE 0.05% EY: 0.05 | 28 days supply | Qty: 6 | Fill #1

## 2017-04-16 MED FILL — METOPROLOL SUCC ER 25 MG TA: 25 | 30 days supply | Qty: 30 | Fill #0

## 2017-04-16 MED FILL — TRIAMTERENE-HCTZ 37.5-25 MG: 37.5-25 | 30 days supply | Qty: 30 | Fill #3

## 2017-06-02 ENCOUNTER — Other Ambulatory Visit: Payer: Self-pay | Admitting: Family Medicine

## 2017-06-02 MED FILL — METOPROLOL SUCC ER 25 MG TA: 25 | 30 days supply | Qty: 30 | Fill #1

## 2017-06-02 MED FILL — ROSUVASTATIN CALCIUM 20 MG: 20 | 30 days supply | Qty: 30 | Fill #3

## 2017-06-02 MED FILL — cloNIDine HCL 0.3 MG TABS: 0.3 | 30 days supply | Qty: 30 | Fill #3

## 2017-06-04 NOTE — Telephone Encounter (Signed)
Called patient to find out if she is taking medication 1 pill daily or 1.5 pill daily. Per Dr Lodema HongSimpson, refill as patient is taking.

## 2017-06-09 NOTE — Telephone Encounter (Signed)
Tina Wilkins from [facility] called and left a message on Nurse Line regarding this medication. She states that per their record, patient has been taking 1 tablet daily. Medication refill sent.

## 2017-06-17 ENCOUNTER — Ambulatory Visit (INDEPENDENT_AMBULATORY_CARE_PROVIDER_SITE_OTHER): Payer: 59 | Admitting: Family Medicine

## 2017-06-17 ENCOUNTER — Encounter: Payer: Self-pay | Admitting: Family Medicine

## 2017-06-17 VITALS — BP 150/94 | HR 67 | Resp 16 | Ht 62.0 in | Wt 256.0 lb

## 2017-06-17 DIAGNOSIS — E1169 Type 2 diabetes mellitus with other specified complication: Secondary | ICD-10-CM | POA: Diagnosis not present

## 2017-06-17 DIAGNOSIS — I10 Essential (primary) hypertension: Secondary | ICD-10-CM

## 2017-06-17 DIAGNOSIS — E669 Obesity, unspecified: Secondary | ICD-10-CM

## 2017-06-17 DIAGNOSIS — E784 Other hyperlipidemia: Secondary | ICD-10-CM

## 2017-06-17 DIAGNOSIS — Z1211 Encounter for screening for malignant neoplasm of colon: Secondary | ICD-10-CM | POA: Diagnosis not present

## 2017-06-17 DIAGNOSIS — Z0000 Encounter for general adult medical examination without abnormal findings: Secondary | ICD-10-CM | POA: Diagnosis not present

## 2017-06-17 DIAGNOSIS — E559 Vitamin D deficiency, unspecified: Secondary | ICD-10-CM | POA: Diagnosis not present

## 2017-06-17 DIAGNOSIS — L040 Acute lymphadenitis of face, head and neck: Secondary | ICD-10-CM

## 2017-06-17 DIAGNOSIS — E7849 Other hyperlipidemia: Secondary | ICD-10-CM

## 2017-06-17 LAB — COMPLETE METABOLIC PANEL WITH GFR
ALBUMIN: 4.5 g/dL (ref 3.6–5.1)
ALK PHOS: 95 U/L (ref 33–130)
ALT: 26 U/L (ref 6–29)
AST: 21 U/L (ref 10–35)
BILIRUBIN TOTAL: 0.7 mg/dL (ref 0.2–1.2)
BUN: 10 mg/dL (ref 7–25)
CALCIUM: 10.1 mg/dL (ref 8.6–10.4)
CHLORIDE: 104 mmol/L (ref 98–110)
CO2: 25 mmol/L (ref 20–31)
CREATININE: 0.86 mg/dL (ref 0.50–0.99)
GFR, EST AFRICAN AMERICAN: 84 mL/min (ref >=60)
GFR, Est Non African American: 73 mL/min (ref >=60)
Glucose, Bld: 109 mg/dL — ABNORMAL HIGH (ref 65–99)
Potassium: 4 mmol/L (ref 3.5–5.3)
Sodium: 141 mmol/L (ref 135–146)
Total Protein: 7.5 g/dL (ref 6.1–8.1)

## 2017-06-17 LAB — CBC
HCT: 38.5 % (ref 35.0–45.0)
HEMOGLOBIN: 12.5 g/dL (ref 11.7–15.5)
MCH: 28 pg (ref 27.0–33.0)
MCHC: 32.5 g/dL (ref 32.0–36.0)
MCV: 86.3 fL (ref 80.0–100.0)
MPV: 9.8 fL (ref 7.5–12.5)
PLATELETS: 436 10*3/uL — AB (ref 140–400)
RBC: 4.46 MIL/uL (ref 3.80–5.10)
RDW: 14.9 % (ref 11.0–15.0)
WBC: 7.4 10*3/uL (ref 3.8–10.8)

## 2017-06-17 LAB — LIPID PANEL
CHOLESTEROL: 187 mg/dL (ref <200)
HDL: 79 mg/dL (ref >50)
LDL Cholesterol: 91 mg/dL (ref <100)
TRIGLYCERIDES: 85 mg/dL (ref <150)
Total CHOL/HDL Ratio: 2.4 Ratio (ref <5.0)
VLDL: 17 mg/dL (ref <30)

## 2017-06-17 LAB — TSH: TSH: 1.82 mIU/L

## 2017-06-17 MED ORDER — PENICILLIN V POTASSIUM 500 MG PO TABS: 500.0000 mg | ORAL_TABLET | Freq: Three times a day (TID) | ORAL | 0 refills | Status: DC

## 2017-06-17 MED ORDER — TRIAMTERENE-HCTZ 75-50 MG PO TABS: 1.0000 | ORAL_TABLET | Freq: Every day | ORAL | 1 refills | Status: DC

## 2017-06-17 MED FILL — TRIAMTERENE/HCTZ 75/50 TAB: 75-50 | 30 days supply | Qty: 30 | Fill #0

## 2017-06-17 MED FILL — PENICILLIN VK 500 MG TABLET: 500 | 7 days supply | Qty: 21 | Fill #0

## 2017-06-17 NOTE — Patient Instructions (Addendum)
Nurse bP check in 4 weeks   MD follow up with labs iin 3.5 months.  Microalb today  CBC, lipid, cmp and EgFR, HBA1C, TSH, Vit D today  Increase dose of  Triamterene to 75/50 one daily, the ones you have take two daily not one and a half.  It is important that you exercise regularly at least 30 minutes 5 times a week. If you develop chest pain, have severe difficulty breathing, or feel very tired, stop exercising immediately and seek medical attention   Please work on good  health habits so that your health will improve. 1. Commitment to daily physical activity for 30 to 60  minutes, if you are able to do this.  2. Commitment to wise food choices. Aim for half of your  food intake to be vegetable and fruit, one quarter starchy foods, and one quarter protein. Try to eat on a regular schedule  3 meals per day, snacking between meals should be limited to vegetables or fruits or small portions of nuts. 64 ounces of water per day is generally recommended, unless you have specific health conditions, like heart failure or kidney failure where you will need to limit fluid intake.  3. Commitment to sufficient and a  good quality of physical and mental rest daily, generally between 6 to 8 hours per day.  WITH PERSISTANCE AND PERSEVERANCE, THE IMPOSSIBLE , BECOMES THE NORM!    If sore throat worsens take penicillin prescribed

## 2017-06-18 LAB — MICROALBUMIN / CREATININE URINE RATIO
CREATININE, URINE: 56 mg/dL (ref 20–320)
Microalb Creat Ratio: 13 mcg/mg creat (ref <30)
Microalb, Ur: 0.7 mg/dL

## 2017-06-18 LAB — VITAMIN D 25 HYDROXY (VIT D DEFICIENCY, FRACTURES): Vit D, 25-Hydroxy: 30 ng/mL (ref 30–100)

## 2017-06-18 LAB — HEMOGLOBIN A1C
HEMOGLOBIN A1C: 7 % — AB (ref <5.7)
Mean Plasma Glucose: 154 mg/dL

## 2017-06-21 ENCOUNTER — Telehealth: Payer: Self-pay | Admitting: Family Medicine

## 2017-06-21 ENCOUNTER — Other Ambulatory Visit: Payer: Self-pay | Admitting: Family Medicine

## 2017-06-21 NOTE — Assessment & Plan Note (Signed)
Annual exam as documented. Counseling done  re healthy lifestyle involving commitment to 150 minutes exercise per week, heart healthy diet, and attaining healthy weight.The importance of adequate sleep also discussed. Changes in health habits are decided on by the patient with goals and time frames  set for achieving them. Immunization and cancer screening needs are specifically addressed at this visit. 

## 2017-06-21 NOTE — Assessment & Plan Note (Signed)
3 day h/o intermittent chills with mildly tender left lymph adenopathy , script for penicillin provided for use if deteriorates. Advised salt water gargles , voice rest and tylenol

## 2017-06-21 NOTE — Progress Notes (Signed)
Tina Wilkins     MRN: [id number]      DOB: [birth year]  HPI: Patient is in for annual physical exam. 3 day h/o loss of voice , chills and tender left neck glands. C/o increased stress, multiple deaths in close family and stress of mother in law Labs are to be drawn and will be followed up after visit, pt has discontinued diabetic medication Immunization is reviewed , and  updated if needed.   PE: BP (!) 150/94   Pulse 67   Resp 16   Ht 5\' 2"  (1.575 m)   Wt 256 lb (116.1 kg)   SpO2 99%   BMI 46.82 kg/m  Pleasant  female, alert and oriented x 3, in no cardio-pulmonary distress. Afebrile. HEENT No facial trauma or asymetry. Sinuses non tender.  Extra occullar muscles intact, pupils equally reactive to light. External ears normal, tympanic membranes clear. Oropharynx moist, no exudate. Neck: supple, left anterior mildly tender  Adenopathy, no JVD or thyromegaly.No bruits.  Chest: Clear to ascultation bilaterally.No crackles or wheezes. Non tender to palpation  Breast: No asymetry,no masses or lumps. No tenderness. No nipple discharge or inversion. No axillary or supraclavicular adenopathy  Cardiovascular system; Heart sounds normal,  S1 and  S2 ,no S3.  No murmur, or thrill. Apical beat not displaced Peripheral pulses normal.  Abdomen: Soft, non tender, no organomegaly or masses. No bruits. Bowel sounds normal. No guarding, tenderness or rebound.  Rectal:  Normal sphincter tone. No rectal mass. Guaiac negative stool.  GU: External genitalia normal female genitalia , normal female distribution of hair. No lesions. Urethral meatus normal in size, no  Prolapse, no lesions visibly  Present. Bladder non tender. Vagina pink and moist , with no visible lesions , discharge present . Adequate pelvic support no  cystocele or rectocele noted  Uterus absent, no adnexal masses, no  adnexal tenderness.   Musculoskeletal exam: Full ROM of spine, hips , shoulders and  knees. No deformity ,swelling or crepitus noted. No muscle wasting or atrophy.   Neurologic: Cranial nerves 2 to 12 intact. Power, tone ,sensation and reflexes normal throughout. No disturbance in gait. No tremor.  Skin: Intact, no ulceration, erythema , scaling or rash noted. Pigmentation normal throughout  Psych; Normal mood and affect. Judgement and concentration normal   Assessment & Plan:  Annual physical exam Annual exam as documented. Counseling done  re healthy lifestyle involving commitment to 150 minutes exercise per week, heart healthy diet, and attaining healthy weight.The importance of adequate sleep also discussed. Changes in health habits are decided on by the patient with goals and time frames  set for achieving them. Immunization and cancer screening needs are specifically addressed at this visit.   Hypertension goal BP (blood pressure) < 130/80 Uncontrolled, dose incease in medication with nurse BP check in 4 weeks DASH diet and commitment to daily physical activity for a minimum of 30 minutes discussed and encouraged, as a part of hypertension management. The importance of attaining a healthy weight is also discussed.  BP/Weight 06/17/2017 04/01/2017 01/29/2017 10/01/2016 09/10/2016 07/14/2016 06/11/2016  Systolic BP 150 132 130 140 - 409 -  Diastolic BP 94 88 84 90 - 97 -  Wt. (Lbs) 256 250.12 251 251 247 250.8 246.2  BMI 46.82 45.75 45.91 45.91 45.18 44.43 45.02       Diabetes mellitus type 2 in obese [facility]) Updated lab needed to determine appropriate management. Pt states she has discontinued medication for at least 3 to 4  weeks  Acute cervical adenitis 3 day h/o intermittent chills with mildly tender left lymph adenopathy , script for penicillin provided for use if deteriorates. Advised salt water gargles , voice rest and tylenol

## 2017-06-21 NOTE — Telephone Encounter (Signed)
Pls discuss the result note with me re use of metformin in this pt before you call her , and after you speak with her also , see result note of 06/21/2017, thanks

## 2017-06-21 NOTE — Assessment & Plan Note (Addendum)
Updated lab needed to determine appropriate management. Pt states she has discontinued medication for at least 3 to 4 weeks

## 2017-06-21 NOTE — Assessment & Plan Note (Signed)
Uncontrolled, dose incease in medication with nurse BP check in 4 weeks DASH diet and commitment to daily physical activity for a minimum of 30 minutes discussed and encouraged, as a part of hypertension management. The importance of attaining a healthy weight is also discussed.  BP/Weight 06/17/2017 04/01/2017 01/29/2017 10/01/2016 09/10/2016 07/14/2016 06/11/2016  Systolic BP 150 132 130 140 - 161157 -  Diastolic BP 94 88 84 90 - 97 -  Wt. (Lbs) 256 250.12 251 251 247 250.8 246.2  BMI 46.82 45.75 45.91 45.91 45.18 44.43 45.02

## 2017-06-22 ENCOUNTER — Encounter: Payer: Self-pay | Admitting: Family Medicine

## 2017-06-22 ENCOUNTER — Telehealth: Payer: Self-pay | Admitting: Family Medicine

## 2017-06-22 DIAGNOSIS — E1169 Type 2 diabetes mellitus with other specified complication: Secondary | ICD-10-CM

## 2017-06-22 DIAGNOSIS — E669 Obesity, unspecified: Principal | ICD-10-CM

## 2017-06-22 LAB — POC HEMOCCULT BLD/STL (OFFICE/1-CARD/DIAGNOSTIC): Fecal Occult Blood, POC: NEGATIVE

## 2017-06-22 NOTE — Telephone Encounter (Signed)
Lab orders entered

## 2017-07-06 MED FILL — ROSUVASTATIN CALCIUM 20 MG: 20 | 30 days supply | Qty: 30 | Fill #4

## 2017-07-06 MED FILL — cloNIDine HCL 0.3 MG TABS: 0.3 | 30 days supply | Qty: 30 | Fill #4

## 2017-07-06 MED FILL — METOPROLOL SUCC ER 25 MG TA: 25 | 30 days supply | Qty: 30 | Fill #2

## 2017-07-06 MED FILL — VIT D2 1.25 MG (50,000 UNIT: 1.25 MG | 28 days supply | Qty: 4 | Fill #3

## 2017-07-09 LAB — RENAL FUNCTION PANEL
Albumin: 4.6 g/dL (ref 3.6–5.1)
BUN: 19 mg/dL (ref 7–25)
CALCIUM: 10.2 mg/dL (ref 8.6–10.4)
CHLORIDE: 101 mmol/L (ref 98–110)
CO2: 25 mmol/L (ref 20–32)
CREATININE: 1.03 mg/dL — AB (ref 0.50–0.99)
Glucose, Bld: 117 mg/dL — ABNORMAL HIGH (ref 65–99)
PHOSPHORUS: 2.9 mg/dL (ref 2.5–4.5)
POTASSIUM: 3.8 mmol/L (ref 3.5–5.3)
Sodium: 140 mmol/L (ref 135–146)

## 2017-07-09 LAB — HEMOGLOBIN A1C
HEMOGLOBIN A1C: 7.2 % — AB (ref <5.7)
Mean Plasma Glucose: 160 mg/dL

## 2017-07-09 LAB — GLUCOSE, RANDOM: GLUCOSE: 117 mg/dL — AB (ref 65–99)

## 2017-07-09 MED FILL — TRUE METRIX GLUCOSE TEST ST: 25 days supply | Qty: 25 | Fill #1

## 2017-07-15 ENCOUNTER — Ambulatory Visit: Payer: 59

## 2017-07-15 ENCOUNTER — Telehealth: Payer: Self-pay

## 2017-07-15 VITALS — BP 128/84

## 2017-07-15 DIAGNOSIS — I10 Essential (primary) hypertension: Secondary | ICD-10-CM

## 2017-07-15 DIAGNOSIS — J312 Chronic pharyngitis: Secondary | ICD-10-CM

## 2017-07-15 NOTE — Progress Notes (Signed)
Blood pressure has improved and is in acceptable range. Will keep next appt for follow up

## 2017-07-15 NOTE — Telephone Encounter (Signed)
Called patient no answer.

## 2017-07-15 NOTE — Telephone Encounter (Signed)
Called patient and left message for them to return call at the office   

## 2017-07-15 NOTE — Telephone Encounter (Signed)
Still has a sore throat, was treated the last time she was here with antibiotic but still having a lot of drainage sometimes mucus is greenish yellow and she wakes up with crusty eyes and sounds hoarse. No fever. Wants something else called in to [facility] [place]

## 2017-07-15 NOTE — Telephone Encounter (Signed)
I recommend sputum sent for c/s , since penicillin has not been effective per her reporting do not want resistance to develop For sore throat can send in magic mouthwash and decongestant perles for cough if she wishes , recommend a lot of water pls get back to me

## 2017-07-17 NOTE — Addendum Note (Signed)
Addended by: Mack Hook on: 07/17/2017 11:18 AM   Modules accepted: Orders

## 2017-07-17 NOTE — Telephone Encounter (Signed)
Tina Wilkins- can you call this patient and tell her to come collect a specimen cup and label it and order a resp sputum culture and put it in the specimen bag for her to take back to the lab once collected

## 2017-07-17 NOTE — Telephone Encounter (Signed)
Called patient regarding message below. No answer, unable to leave message.  

## 2017-08-06 MED FILL — cloNIDine HCL 0.3 MG TABS: 0.3 | 30 days supply | Qty: 30 | Fill #5

## 2017-08-06 MED FILL — LUMIGAN 0.01% EYE DROPS: 0.01 | 25 days supply | Qty: 3 | Fill #1

## 2017-08-06 MED FILL — BENAZEPRIL HCL 40 MG TABLET: 40 | 30 days supply | Qty: 30 | Fill #3

## 2017-08-06 MED FILL — TRIAMTERENE-HCTZ 75-50 MG T: 75-50 | 30 days supply | Qty: 30 | Fill #1

## 2017-08-06 MED FILL — METHOCARBAMOL 500 MG TABLET: 500 | 30 days supply | Qty: 30 | Fill #2

## 2017-08-06 MED FILL — METOPROLOL SUCC ER 25 MG TA: 25 | 30 days supply | Qty: 30 | Fill #3

## 2017-09-09 ENCOUNTER — Other Ambulatory Visit: Payer: Self-pay | Admitting: Family Medicine

## 2017-09-09 MED FILL — METHOCARBAMOL 500 MG TABLET: 500 | 30 days supply | Qty: 30 | Fill #3

## 2017-09-09 MED FILL — TRIAMTERENE-HCTZ 75-50 MG T: 75-50 | 30 days supply | Qty: 30 | Fill #2

## 2017-09-09 MED FILL — ROSUVASTATIN CALCIUM 20 MG: 20 | 30 days supply | Qty: 30 | Fill #5

## 2017-09-09 MED FILL — BENAZEPRIL HCL 40 MG TABLET: 40 | 30 days supply | Qty: 30 | Fill #1

## 2017-09-09 MED FILL — cloNIDine HCL 0.3 MG TABS: 0.3 | 30 days supply | Qty: 30 | Fill #0

## 2017-09-09 MED FILL — METOPROLOL SUCC ER 25 MG TA: 25 | 30 days supply | Qty: 30 | Fill #4

## 2017-10-06 ENCOUNTER — Other Ambulatory Visit: Payer: Self-pay | Admitting: Family Medicine

## 2017-10-06 MED FILL — METOPROLOL SUCC ER 25 MG TA: 25 | 30 days supply | Qty: 30 | Fill #5

## 2017-10-06 MED FILL — cloNIDine HCL 0.3 MG TABS: 0.3 | 30 days supply | Qty: 30 | Fill #1

## 2017-10-06 MED FILL — TRIAMTERENE-HCTZ 75-50 MG T: 75-50 | 30 days supply | Qty: 30 | Fill #3

## 2017-10-06 MED FILL — ROSUVASTATIN CALCIUM 20 MG: 20 | 30 days supply | Qty: 30 | Fill #0

## 2017-10-06 MED FILL — BENAZEPRIL HCL 40 MG TABLET: 40 | 30 days supply | Qty: 30 | Fill #2

## 2017-10-08 ENCOUNTER — Ambulatory Visit: Payer: 59 | Admitting: Family Medicine

## 2017-10-14 ENCOUNTER — Other Ambulatory Visit: Payer: Self-pay | Admitting: Family Medicine

## 2017-10-14 DIAGNOSIS — Z1239 Encounter for other screening for malignant neoplasm of breast: Secondary | ICD-10-CM

## 2017-11-06 MED FILL — BENAZEPRIL HCL 40 MG TABLET: 40 | 30 days supply | Qty: 30 | Fill #3

## 2017-11-06 MED FILL — cloNIDine HCL 0.3 MG TABS: 0.3 | 30 days supply | Qty: 30 | Fill #2

## 2017-11-06 MED FILL — METOPROLOL SUCC ER 25 MG TA: 25 | 30 days supply | Qty: 30 | Fill #6

## 2017-11-06 MED FILL — TRIAMTERENE-HCTZ 75-50 MG T: 75-50 | 30 days supply | Qty: 30 | Fill #4

## 2017-11-10 MED FILL — ROSUVASTATIN CALCIUM 20 MG: 20 | 30 days supply | Qty: 30 | Fill #1

## 2017-12-02 ENCOUNTER — Ambulatory Visit (HOSPITAL_COMMUNITY): Payer: 59

## 2017-12-09 ENCOUNTER — Other Ambulatory Visit: Payer: Self-pay | Admitting: Family Medicine

## 2017-12-09 ENCOUNTER — Ambulatory Visit (HOSPITAL_COMMUNITY)
Admission: RE | Admit: 2017-12-09 | Discharge: 2017-12-09 | Disposition: A | Discharge status: Home / Self Care | Payer: 59 | Source: Ambulatory Visit | Attending: Family Medicine | Admitting: Family Medicine

## 2017-12-09 DIAGNOSIS — Z1231 Encounter for screening mammogram for malignant neoplasm of breast: Secondary | ICD-10-CM | POA: Diagnosis not present

## 2017-12-09 DIAGNOSIS — Z1239 Encounter for other screening for malignant neoplasm of breast: Secondary | ICD-10-CM

## 2017-12-09 MED FILL — BENAZEPRIL HCL 40 MG TABLET: 40 | 30 days supply | Qty: 30 | Fill #0

## 2017-12-09 MED FILL — TRIAMTERENE-HCTZ 75-50 MG T: 75-50 | 30 days supply | Qty: 30 | Fill #5

## 2017-12-09 MED FILL — metFORMIN HCL 1000 MG TABS: 1000 | 30 days supply | Qty: 60 | Fill #0

## 2017-12-09 MED FILL — ROSUVASTATIN CALCIUM 20 MG: 20 | 30 days supply | Qty: 30 | Fill #2

## 2017-12-09 MED FILL — METOPROLOL SUCC ER 25 MG TA: 25 | 30 days supply | Qty: 30 | Fill #7

## 2017-12-09 MED FILL — VIT D2 1.25 MG (50,000 UNIT: 1.25 MG | 27 days supply | Qty: 4 | Fill #0

## 2017-12-09 MED FILL — METHOCARBAMOL 500 MG TABS: 500 | 30 days supply | Qty: 30 | Fill #4

## 2017-12-09 MED FILL — PANTOPRAZOLE SOD DR 40 MG T: 40 | 30 days supply | Qty: 30 | Fill #1

## 2017-12-09 MED FILL — cloNIDine HCL 0.3 MG TABS: 0.3 | 30 days supply | Qty: 30 | Fill #3

## 2017-12-11 MED FILL — TRUE METRIX GLUCOSE TEST ST: 25 days supply | Qty: 25 | Fill #2

## 2017-12-14 MED FILL — LUMIGAN 0.01% EYE DROPS: 0.01 | 25 days supply | Qty: 3 | Fill #2

## 2017-12-15 MED FILL — RESTASIS MULTIDOSE 0.05% EY: 0.05 | 25 days supply | Qty: 6 | Fill #0

## 2018-01-12 MED FILL — metFORMIN HCL 1000 MG TABS: 1000 | 30 days supply | Qty: 60 | Fill #1

## 2018-01-12 MED FILL — cloNIDine HCL 0.3 MG TABS: 0.3 | 30 days supply | Qty: 30 | Fill #4

## 2018-01-12 MED FILL — METOPROLOL SUCCINATE ER 25: 25 | 30 days supply | Qty: 30 | Fill #8

## 2018-01-12 MED FILL — AMLODIPINE BESYLATE 10 MG T: 10 | 30 days supply | Qty: 30 | Fill #1

## 2018-01-12 MED FILL — PANTOPRAZOLE SOD DR 40 MG T: 40 | 30 days supply | Qty: 30 | Fill #2

## 2018-01-12 MED FILL — METHOCARBAMOL 500 MG TABLET: 500 | 30 days supply | Qty: 30 | Fill #5

## 2018-01-12 MED FILL — BENAZEPRIL HCL 40 MG TABLET: 40 | 30 days supply | Qty: 30 | Fill #1

## 2018-01-12 MED FILL — VIT D2 1.25 MG (50,000 UNIT: 1.25 MG | 28 days supply | Qty: 4 | Fill #1

## 2018-01-21 ENCOUNTER — Other Ambulatory Visit: Payer: Self-pay

## 2018-01-21 ENCOUNTER — Telehealth: Payer: Self-pay | Admitting: Family Medicine

## 2018-01-21 DIAGNOSIS — I10 Essential (primary) hypertension: Secondary | ICD-10-CM

## 2018-01-21 DIAGNOSIS — E669 Obesity, unspecified: Principal | ICD-10-CM

## 2018-01-21 DIAGNOSIS — E1169 Type 2 diabetes mellitus with other specified complication: Secondary | ICD-10-CM

## 2018-01-21 DIAGNOSIS — E7849 Other hyperlipidemia: Secondary | ICD-10-CM

## 2018-01-21 MED ORDER — TRIAMTERENE-HCTZ 75-50 MG PO TABS: 1.0000 | ORAL_TABLET | Freq: Every day | ORAL | 1 refills | Status: DC

## 2018-01-21 MED FILL — TRIAMTERENE-HCTZ 75-50 MG T: 75-50 | 30 days supply | Qty: 30 | Fill #0

## 2018-01-21 NOTE — Telephone Encounter (Signed)
Please order LABs for March Appt, please let me know so I can call the pt back,.Marland Kitchen.Marland Kitchen.Thanks

## 2018-01-25 NOTE — Telephone Encounter (Signed)
Labs have been ordered fasting.

## 2018-02-02 ENCOUNTER — Ambulatory Visit: Payer: 59 | Admitting: Family Medicine

## 2018-02-06 DIAGNOSIS — E1169 Type 2 diabetes mellitus with other specified complication: Secondary | ICD-10-CM | POA: Diagnosis not present

## 2018-02-06 DIAGNOSIS — E7849 Other hyperlipidemia: Secondary | ICD-10-CM | POA: Diagnosis not present

## 2018-02-08 LAB — LIPID PANEL
CHOLESTEROL: 161 mg/dL (ref <200)
HDL: 57 mg/dL (ref >50)
LDL CHOLESTEROL (CALC): 87 mg/dL
Non-HDL Cholesterol (Calc): 104 mg/dL (calc) (ref <130)
TRIGLYCERIDES: 84 mg/dL (ref <150)
Total CHOL/HDL Ratio: 2.8 (calc) (ref <5.0)

## 2018-02-08 LAB — COMPLETE METABOLIC PANEL WITH GFR
AG RATIO: 1.5 (calc) (ref 1.0–2.5)
ALKALINE PHOSPHATASE (APISO): 93 U/L (ref 33–130)
ALT: 23 U/L (ref 6–29)
AST: 20 U/L (ref 10–35)
Albumin: 4.2 g/dL (ref 3.6–5.1)
BILIRUBIN TOTAL: 0.7 mg/dL (ref 0.2–1.2)
BUN/Creatinine Ratio: 10 (calc) (ref 6–22)
BUN: 10 mg/dL (ref 7–25)
CHLORIDE: 103 mmol/L (ref 98–110)
CO2: 30 mmol/L (ref 20–32)
Calcium: 9.5 mg/dL (ref 8.6–10.4)
Creat: 1.03 mg/dL — ABNORMAL HIGH (ref 0.50–0.99)
GFR, Est African American: 67 mL/min/{1.73_m2} (ref >=60)
GFR, Est Non African American: 58 mL/min/{1.73_m2} — ABNORMAL LOW (ref >=60)
GLUCOSE: 118 mg/dL — AB (ref 65–99)
Globulin: 2.8 g/dL (calc) (ref 1.9–3.7)
POTASSIUM: 4 mmol/L (ref 3.5–5.3)
Sodium: 141 mmol/L (ref 135–146)
TOTAL PROTEIN: 7 g/dL (ref 6.1–8.1)

## 2018-02-08 LAB — HEMOGLOBIN A1C
HEMOGLOBIN A1C: 7 %{Hb} — AB (ref <5.7)
MEAN PLASMA GLUCOSE: 154 (calc)
eAG (mmol/L): 8.5 (calc)

## 2018-02-09 ENCOUNTER — Ambulatory Visit: Payer: 59 | Admitting: Family Medicine

## 2018-02-09 ENCOUNTER — Encounter: Payer: Self-pay | Admitting: Family Medicine

## 2018-02-09 VITALS — BP 140/82 | HR 66 | Temp 98.2°F | Resp 16 | Ht 62.0 in | Wt 252.0 lb

## 2018-02-09 DIAGNOSIS — E1169 Type 2 diabetes mellitus with other specified complication: Secondary | ICD-10-CM | POA: Diagnosis not present

## 2018-02-09 DIAGNOSIS — J209 Acute bronchitis, unspecified: Secondary | ICD-10-CM

## 2018-02-09 DIAGNOSIS — E7849 Other hyperlipidemia: Secondary | ICD-10-CM | POA: Diagnosis not present

## 2018-02-09 DIAGNOSIS — E669 Obesity, unspecified: Secondary | ICD-10-CM

## 2018-02-09 DIAGNOSIS — J0100 Acute maxillary sinusitis, unspecified: Secondary | ICD-10-CM

## 2018-02-09 DIAGNOSIS — I10 Essential (primary) hypertension: Secondary | ICD-10-CM | POA: Diagnosis not present

## 2018-02-09 MED ORDER — MONTELUKAST SODIUM 10 MG PO TABS: 10.0000 mg | ORAL_TABLET | Freq: Every day | ORAL | 1 refills | Status: DC

## 2018-02-09 MED ORDER — PENICILLIN V POTASSIUM 500 MG PO TABS: 500.0000 mg | ORAL_TABLET | Freq: Three times a day (TID) | ORAL | 0 refills | Status: DC

## 2018-02-09 MED ORDER — FLUTICASONE PROPIONATE 50 MCG/ACT NA SUSP: 2.0000 | Freq: Every day | NASAL | 3 refills | Status: DC

## 2018-02-09 MED FILL — MONTELUKAST SOD 10 MG TAB: 10 | 30 days supply | Qty: 30 | Fill #0

## 2018-02-09 MED FILL — FLUTICASONE PROP 50 MCG SPR: 50 | 30 days supply | Qty: 16 | Fill #0

## 2018-02-09 MED FILL — PENICILLIN VK 500 MG TABLET: 500 | 7 days supply | Qty: 21 | Fill #0

## 2018-02-09 NOTE — Progress Notes (Signed)
Tina RoyalsRosetta G Wilkins     MRN: [id number]      DOB: 09/20/1956   HPI Tina Wilkins is here for follow up and re-evaluation of chronic medical conditions, medication management and review of any available recent lab and radiology data.  Preventive health is updated, specifically  Cancer screening and Immunization.   Questions or concerns regarding consultations or procedures which the PT has had in the interim are  addressed. The PT denies any adverse reactions to current medications since the last visit.  2 week h/o worsening head and chest congestion, associated with fever and chills intermittently. Nasal drainage has thickened , and is yellowish green, and at times bloody. Sputum is thick and yellow. C/o bilateral ear pressure, denies hearing loss and sore throat. Increasing fatigue , poor appetitie and sleep disturbed by cough. No improvement with OTC medication.    ROS Denies abdominal pain, nausea, vomiting,diarrhea or constipation.   Denies dysuria, frequency, hesitancy or incontinence. Denies joint pain, swelling and limitation in mobility. Denies headaches, seizures, numbness, or tingling. Denies depression, anxiety or insomnia. Denies skin break down or rash.   PE  BP 140/82   Pulse 66   Temp 98.2 F (36.8 C) (Oral)   Resp 16   Ht 5\' 2"  (1.575 m)   Wt 252 lb (114.3 kg)   SpO2 97%   BMI 46.09 kg/m   Patient alert and oriented and in no cardiopulmonary distress.  HEENT: No facial asymmetry, EOMI,   oropharynx pink and moist.  Neck supple no JVD, no mass.Maxillary sinus tenderness, TM erythematous, anterior cervical adenitis  Chest: adequate air entry bilaterally , few crackles , no wheezes  CVS: S1, S2 no murmurs, no S3.Regular rate.  ABD: Soft non tender.   Ext: No edema  MS: Adequate ROM spine, shoulders, hips and knees.  Skin: Intact, no ulcerations or rash noted.  Psych: Good eye contact, normal affect. Memory intact not anxious or depressed  appearing.  CNS: CN 2-12 intact, power,  normal throughout.no focal deficits noted.   Assessment & Plan  Acute sinusitis Penicillin prescribed  Acute bronchitis Tessalon perles and penicillin V prescribed  Diabetes mellitus type 2 in obese [facility]) Tina Wilkins is reminded of the importance of commitment to daily physical activity for 30 minutes or more, as able and the need to limit carbohydrate intake to 30 to 60 grams per meal to help with blood sugar control.   The need to take medication as prescribed, test blood sugar as directed, and to call between visits if there is a concern that blood sugar is uncontrolled is also discussed.   Tina Wilkins is reminded of the importance of daily foot exam, annual eye examination, and good blood sugar, blood pressure and cholesterol control. Controlled, no change in medication   Diabetic Labs Latest Ref Rng & Units 02/06/2018 07/08/2017 06/17/2017 01/27/2017 10/01/2016  HbA1c <5.7 % of total Hgb 7.0(H) 7.2(H) 7.0(H) 7.3(H) 6.8(H)  Microalbumin Not estab mg/dL - - 0.7 - -  Micro/Creat Ratio <30 mcg/mg creat - - 13 - -  Chol <200 mg/dL 102161 - 725187 - 366169  HDL >44>50 mg/dL 57 - 79 - 74  Calc LDL mg/dL (calc) 87 - 91 - 81  Triglycerides <150 mg/dL 84 - 85 - 68  Creatinine 0.50 - 0.99 mg/dL 0.34(V1.03(H) 4.25(Z1.03(H) 5.630.86 8.750.94 1.01(H)   BP/Weight 02/09/2018 07/15/2017 06/17/2017 04/01/2017 01/29/2017 10/01/2016 09/10/2016  Systolic BP 140 128 150 132 130 140 -  Diastolic BP 82 84 94 88 84  90 -  Wt. (Lbs) 252 - 256 250.12 251 251 247  BMI 46.09 - 46.82 45.75 45.91 45.91 45.18   Foot/eye exam completion dates Latest Ref Rng & Units 06/17/2017 01/01/2017  Eye Exam No Retinopathy - No Retinopathy  Foot Form Completion - Done -        Hyperlipidemia Hyperlipidemia:Low fat diet discussed and encouraged.   Lipid Panel  Lab Results  Component Value Date   CHOL 161 02/06/2018   HDL 57 02/06/2018   LDLCALC 87 02/06/2018   TRIG 84 02/06/2018   CHOLHDL 2.8 02/06/2018    Controlled, no change in medication     Hypertension goal BP (blood pressure) < 130/80 Uncontrolled DASH diet and commitment to daily physical activity for a minimum of 30 minutes discussed and encouraged, as a part of hypertension management. The importance of attaining a healthy weight is also discussed.  BP/Weight 02/09/2018 07/15/2017 06/17/2017 04/01/2017 01/29/2017 10/01/2016 09/10/2016  Systolic BP 140 128 150 132 130 140 -  Diastolic BP 82 84 94 88 84 90 -  Wt. (Lbs) 252 - 256 250.12 251 251 247  BMI 46.09 - 46.82 45.75 45.91 45.91 45.18       Morbid obesity Improved. Patient re-educated about  the importance of commitment to a  minimum of 150 minutes of exercise per week.  The importance of healthy food choices with portion control discussed. Encouraged to start a food diary, count calories and to consider  joining a support group. Sample diet sheets offered. Goals set by the patient for the next several months.   Weight /BMI 02/09/2018 06/17/2017 04/01/2017  WEIGHT 252 lb 256 lb 250 lb 1.9 oz  HEIGHT 5\' 2"  5\' 2"  5\' 2"   BMI 46.09 kg/m2 46.82 kg/m2 45.75 kg/m2

## 2018-02-09 NOTE — Patient Instructions (Addendum)
Physical exam end August, call if you need me before  Fasting CBC, lipid, cmp and eGFR, HBA1C, tSH and microalb 3 days before next visit  You are treated for sinusitis, penicillin is sent in  Updated Shingrix vaccines are indicated  Call and come in when better    Please start daily Singulair and Flonase both are sent in  Blood pressure is increased today, however  This is because of your recent use of medication for the cough   Blood work is excellent    Please work on good  health habits so that your health will improve. 1. Commitment to daily physical activity for 30 to 60  minutes, if you are able to do this.  2. Commitment to wise food choices. Aim for half of your  food intake to be vegetable and fruit, one quarter starchy foods, and one quarter protein. Try to eat on a regular schedule  3 meals per day, snacking between meals should be limited to vegetables or fruits or small portions of nuts. 64 ounces of water per day is generally recommended, unless you have specific health conditions, like heart failure or kidney failure where you will need to limit fluid intake.  3. Commitment to sufficient and a  good quality of physical and mental rest daily, generally between 6 to 8 hours per day.  WITH PERSISTANCE AND PERSEVERANCE, THE IMPOSSIBLE , BECOMES THE NORM!  It is important that you exercise regularly at least 30 minutes 5 times a week. If you develop chest pain, have severe difficulty breathing, or feel very tired, stop exercising immediately and seek medical attention

## 2018-02-15 ENCOUNTER — Encounter: Payer: Self-pay | Admitting: Family Medicine

## 2018-02-15 MED FILL — METOPROLOL SUCCINATE ER 25: 25 | 30 days supply | Qty: 30 | Fill #9

## 2018-02-15 MED FILL — ROSUVASTATIN CALCIUM 20 MG: 20 | 30 days supply | Qty: 30 | Fill #3

## 2018-02-15 MED FILL — TRIAMTERENE-HCTZ 75-50 MG T: 75-50 | 30 days supply | Qty: 30 | Fill #1

## 2018-02-15 MED FILL — cloNIDine HCL 0.3 MG TABS: 0.3 | 30 days supply | Qty: 30 | Fill #5

## 2018-02-15 NOTE — Assessment & Plan Note (Signed)
Tessalon perles and penicillin V prescribed

## 2018-02-15 NOTE — Assessment & Plan Note (Signed)
Improved. Patient re-educated about  the importance of commitment to a  minimum of 150 minutes of exercise per week.  The importance of healthy food choices with portion control discussed. Encouraged to start a food diary, count calories and to consider  joining a support group. Sample diet sheets offered. Goals set by the patient for the next several months.   Weight /BMI 02/09/2018 06/17/2017 04/01/2017  WEIGHT 252 lb 256 lb 250 lb 1.9 oz  HEIGHT 5\' 2"  5\' 2"  5\' 2"   BMI 46.09 kg/m2 46.82 kg/m2 45.75 kg/m2

## 2018-02-15 NOTE — Assessment & Plan Note (Signed)
Uncontrolled DASH diet and commitment to daily physical activity for a minimum of 30 minutes discussed and encouraged, as a part of hypertension management. The importance of attaining a healthy weight is also discussed.  BP/Weight 02/09/2018 07/15/2017 06/17/2017 04/01/2017 01/29/2017 10/01/2016 09/10/2016  Systolic BP 140 128 150 132 130 140 -  Diastolic BP 82 84 94 88 84 90 -  Wt. (Lbs) 252 - 256 250.12 251 251 247  BMI 46.09 - 46.82 45.75 45.91 45.91 45.18

## 2018-02-15 NOTE — Assessment & Plan Note (Signed)
Hyperlipidemia:Low fat diet discussed and encouraged.   Lipid Panel  Lab Results  Component Value Date   CHOL 161 02/06/2018   HDL 57 02/06/2018   LDLCALC 87 02/06/2018   TRIG 84 02/06/2018   CHOLHDL 2.8 02/06/2018   Controlled, no change in medication

## 2018-02-15 NOTE — Assessment & Plan Note (Signed)
Penicillin prescribed 

## 2018-02-15 NOTE — Assessment & Plan Note (Signed)
Tina Wilkins is reminded of the importance of commitment to daily physical activity for 30 minutes or more, as able and the need to limit carbohydrate intake to 30 to 60 grams per meal to help with blood sugar control.   The need to take medication as prescribed, test blood sugar as directed, and to call between visits if there is a concern that blood sugar is uncontrolled is also discussed.   Tina Wilkins is reminded of the importance of daily foot exam, annual eye examination, and good blood sugar, blood pressure and cholesterol control. Controlled, no change in medication   Diabetic Labs Latest Ref Rng & Units 02/06/2018 07/08/2017 06/17/2017 01/27/2017 10/01/2016  HbA1c <5.7 % of total Hgb 7.0(H) 7.2(H) 7.0(H) 7.3(H) 6.8(H)  Microalbumin Not estab mg/dL - - 0.7 - -  Micro/Creat Ratio <30 mcg/mg creat - - 13 - -  Chol <200 mg/dL 161161 - 096187 - 045169  HDL >40>50 mg/dL 57 - 79 - 74  Calc LDL mg/dL (calc) 87 - 91 - 81  Triglycerides <150 mg/dL 84 - 85 - 68  Creatinine 0.50 - 0.99 mg/dL 9.81(X1.03(H) 9.14(N1.03(H) 8.290.86 5.620.94 1.01(H)   BP/Weight 02/09/2018 07/15/2017 06/17/2017 04/01/2017 01/29/2017 10/01/2016 09/10/2016  Systolic BP 140 128 150 132 130 140 -  Diastolic BP 82 84 94 88 84 90 -  Wt. (Lbs) 252 - 256 250.12 251 251 247  BMI 46.09 - 46.82 45.75 45.91 45.91 45.18   Foot/eye exam completion dates Latest Ref Rng & Units 06/17/2017 01/01/2017  Eye Exam No Retinopathy - No Retinopathy  Foot Form Completion - Done -

## 2018-03-02 DIAGNOSIS — H401111 Primary open-angle glaucoma, right eye, mild stage: Secondary | ICD-10-CM | POA: Diagnosis not present

## 2018-03-18 ENCOUNTER — Other Ambulatory Visit: Payer: Self-pay | Admitting: Family Medicine

## 2018-03-18 MED FILL — RESTASIS MULTIDOSE 0.05% EY: 0.05 | 25 days supply | Qty: 6 | Fill #1

## 2018-03-18 MED FILL — TRIAMTERENE-HCTZ 75-50 MG T: 75-50 | 30 days supply | Qty: 30 | Fill #2

## 2018-03-18 MED FILL — ROSUVASTATIN CALCIUM 20 MG: 20 | 30 days supply | Qty: 30 | Fill #4

## 2018-03-18 MED FILL — METOPROLOL SUCCINATE ER 25: 25 | 30 days supply | Qty: 30 | Fill #10

## 2018-03-22 MED FILL — LUMIGAN 0.01% EYE DROPS: 0.01 | 25 days supply | Qty: 3 | Fill #0

## 2018-03-22 MED FILL — cloNIDine HCL 0.3 MG TABS: 0.3 | 30 days supply | Qty: 30 | Fill #0

## 2018-04-30 ENCOUNTER — Other Ambulatory Visit: Payer: Self-pay | Admitting: Cardiology

## 2018-04-30 MED FILL — cloNIDine HCL 0.3 MG TABS: 0.3 | 30 days supply | Qty: 30 | Fill #1

## 2018-04-30 MED FILL — METOPROLOL SUCCINATE ER 25: 25 | 30 days supply | Qty: 30 | Fill #0

## 2018-04-30 MED FILL — ROSUVASTATIN CALCIUM 20 MG: 20 | 30 days supply | Qty: 30 | Fill #5

## 2018-04-30 MED FILL — TRIAMTERENE-HCTZ 75-50 MG T: 75-50 | 30 days supply | Qty: 30 | Fill #3

## 2018-05-03 MED FILL — LUMIGAN 0.01% EYE DROPS: 0.01 | 25 days supply | Qty: 3 | Fill #1

## 2018-06-01 ENCOUNTER — Other Ambulatory Visit: Payer: Self-pay | Admitting: Family Medicine

## 2018-06-01 ENCOUNTER — Other Ambulatory Visit: Payer: Self-pay | Admitting: Cardiology

## 2018-06-01 MED FILL — VIT D2 1.25 MG (50,000 UNIT: 1.25 MG | 28 days supply | Qty: 4 | Fill #1

## 2018-06-01 MED FILL — ROSUVASTATIN CALCIUM 20 MG: 20 | 30 days supply | Qty: 30 | Fill #0

## 2018-06-01 MED FILL — cloNIDine HCL 0.3 MG TABS: 0.3 | 30 days supply | Qty: 30 | Fill #2

## 2018-06-01 MED FILL — TRIAMTERENE-HCTZ 75-50 MG T: 75-50 | 30 days supply | Qty: 30 | Fill #4

## 2018-06-01 MED FILL — MONTELUKAST SOD 10 MG TAB: 10 | 30 days supply | Qty: 30 | Fill #1

## 2018-06-01 MED FILL — METOPROLOL SUCCINATE ER 25: 25 | 30 days supply | Qty: 30 | Fill #0

## 2018-07-06 MED FILL — XIIDRA 5% EYE DROPS: 5 | 30 days supply | Qty: 60 | Fill #0

## 2018-07-06 MED FILL — cloNIDine HCL 0.3 MG TABS: 0.3 | 30 days supply | Qty: 30 | Fill #3

## 2018-07-06 MED FILL — RESTASIS 0.05% EYE EMULSION: 0.05 | 30 days supply | Qty: 60 | Fill #0

## 2018-07-06 MED FILL — TRIAMTERENE-HCTZ 75-50 MG T: 75-50 | 30 days supply | Qty: 30 | Fill #5

## 2018-07-06 MED FILL — ROSUVASTATIN CALCIUM 20 MG: 20 | 30 days supply | Qty: 30 | Fill #1

## 2018-07-06 MED FILL — METOPROLOL SUCCINATE ER 25: 25 | 30 days supply | Qty: 30 | Fill #1

## 2018-07-06 MED FILL — MONTELUKAST SOD 10 MG TAB: 10 | 30 days supply | Qty: 30 | Fill #2

## 2018-07-06 MED FILL — LUMIGAN 0.01% EYE DROPS: 0.01 | 25 days supply | Qty: 3 | Fill #2

## 2018-07-23 ENCOUNTER — Telehealth: Payer: Self-pay

## 2018-07-23 DIAGNOSIS — E1169 Type 2 diabetes mellitus with other specified complication: Secondary | ICD-10-CM

## 2018-07-23 DIAGNOSIS — E7849 Other hyperlipidemia: Secondary | ICD-10-CM | POA: Diagnosis not present

## 2018-07-23 DIAGNOSIS — I10 Essential (primary) hypertension: Secondary | ICD-10-CM | POA: Diagnosis not present

## 2018-07-23 DIAGNOSIS — E669 Obesity, unspecified: Secondary | ICD-10-CM

## 2018-07-23 NOTE — Telephone Encounter (Signed)
Labs ordered per dr simpson  

## 2018-07-24 LAB — HEMOGLOBIN A1C
Hgb A1c MFr Bld: 7.6 % of total Hgb — ABNORMAL HIGH (ref <5.7)
Mean Plasma Glucose: 171 (calc)
eAG (mmol/L): 9.5 (calc)

## 2018-07-24 LAB — CBC
HCT: 37.3 % (ref 35.0–45.0)
Hemoglobin: 12.1 g/dL (ref 11.7–15.5)
MCH: 27.7 pg (ref 27.0–33.0)
MCHC: 32.4 g/dL (ref 32.0–36.0)
MCV: 85.4 fL (ref 80.0–100.0)
MPV: 10.2 fL (ref 7.5–12.5)
PLATELETS: 408 10*3/uL — AB (ref 140–400)
RBC: 4.37 10*6/uL (ref 3.80–5.10)
RDW: 13.9 % (ref 11.0–15.0)
WBC: 7.6 10*3/uL (ref 3.8–10.8)

## 2018-07-24 LAB — MICROALBUMIN, URINE: Microalb, Ur: 0.5 mg/dL

## 2018-07-24 LAB — COMPLETE METABOLIC PANEL WITH GFR
AG Ratio: 1.5 (calc) (ref 1.0–2.5)
ALT: 19 U/L (ref 6–29)
AST: 16 U/L (ref 10–35)
Albumin: 4.4 g/dL (ref 3.6–5.1)
Alkaline phosphatase (APISO): 84 U/L (ref 33–130)
BUN/Creatinine Ratio: 15 (calc) (ref 6–22)
BUN: 15 mg/dL (ref 7–25)
CO2: 29 mmol/L (ref 20–32)
Calcium: 10.1 mg/dL (ref 8.6–10.4)
Chloride: 103 mmol/L (ref 98–110)
Creat: 1.03 mg/dL — ABNORMAL HIGH (ref 0.50–0.99)
GFR, Est African American: 67 mL/min/{1.73_m2} (ref >=60)
GFR, Est Non African American: 57 mL/min/{1.73_m2} — ABNORMAL LOW (ref >=60)
Globulin: 2.9 g/dL (calc) (ref 1.9–3.7)
Glucose, Bld: 122 mg/dL — ABNORMAL HIGH (ref 65–99)
Potassium: 4.1 mmol/L (ref 3.5–5.3)
Sodium: 142 mmol/L (ref 135–146)
Total Bilirubin: 0.8 mg/dL (ref 0.2–1.2)
Total Protein: 7.3 g/dL (ref 6.1–8.1)

## 2018-07-24 LAB — LIPID PANEL
CHOL/HDL RATIO: 2.6 (calc) (ref <5.0)
CHOLESTEROL: 166 mg/dL (ref <200)
HDL: 65 mg/dL (ref >50)
LDL Cholesterol (Calc): 82 mg/dL (calc)
NON-HDL CHOLESTEROL (CALC): 101 mg/dL (ref <130)
TRIGLYCERIDES: 93 mg/dL (ref <150)

## 2018-07-24 LAB — TSH: TSH: 1.91 m[IU]/L (ref 0.40–4.50)

## 2018-07-28 ENCOUNTER — Encounter: Payer: Self-pay | Admitting: Family Medicine

## 2018-07-28 ENCOUNTER — Ambulatory Visit (INDEPENDENT_AMBULATORY_CARE_PROVIDER_SITE_OTHER): Payer: 59 | Admitting: Family Medicine

## 2018-07-28 VITALS — BP 138/86 | HR 76 | Resp 16 | Ht 62.0 in | Wt 255.0 lb

## 2018-07-28 DIAGNOSIS — Z0000 Encounter for general adult medical examination without abnormal findings: Secondary | ICD-10-CM | POA: Diagnosis not present

## 2018-07-28 DIAGNOSIS — E669 Obesity, unspecified: Secondary | ICD-10-CM

## 2018-07-28 DIAGNOSIS — E1169 Type 2 diabetes mellitus with other specified complication: Secondary | ICD-10-CM | POA: Diagnosis not present

## 2018-07-28 DIAGNOSIS — Z23 Encounter for immunization: Secondary | ICD-10-CM | POA: Diagnosis not present

## 2018-07-28 NOTE — Progress Notes (Signed)
Tina Wilkins     MRN: [id number]      DOB: [birth year]  HPI: Patient is in for annual physical exam. No other health concerns are expressed or addressed at the visit. Recent labs,  are reviewed.No medication changes needed Immunization is reviewed , and  updated.   PE: BP 138/86   Pulse 76   Resp 16   Ht 5\' 2"  (1.575 m)   Wt 255 lb (115.7 kg)   SpO2 98%   BMI 46.64 kg/m   Pleasant  female, alert and oriented x 3, in no cardio-pulmonary distress. Afebrile. HEENT No facial trauma or asymetry. Sinuses non tender.  Extra occullar muscles intact, . External ears normal, tympanic membranes clear. Oropharynx moist, no exudate. Neck: supple, no adenopathy,JVD or thyromegaly.No bruits.  Chest: Clear to ascultation bilaterally.No crackles or wheezes. Non tender to palpation  Breast: No asymetry,no masses or lumps. No tenderness. No nipple discharge or inversion. No axillary or supraclavicular adenopathy  Cardiovascular system; Heart sounds normal,  S1 and  S2 ,no S3.  No murmur, or thrill. Apical beat not displaced Peripheral pulses normal.  Abdomen: Soft, non tender, no organomegaly or masses. No bruits. Bowel sounds normal. No guarding, tenderness or rebound.  Rectal:  needs to return 3 stool cards.  GU: Asymptomatic , no exam needed Musculoskeletal exam: Full ROM of spine, hips , shoulders and knees. No deformity ,swelling or crepitus noted. No muscle wasting or atrophy.   Neurologic: Cranial nerves 2 to 12 intact. Power, tone ,sensation and reflexes normal throughout. No disturbance in gait. No tremor.  Skin: Intact, no ulceration, erythema , scaling or rash noted. Pigmentation normal throughout  Psych; Normal mood and affect. Judgement and concentration normal   Assessment & Plan:  Annual physical exam Annual exam as documented. Counseling done  re healthy lifestyle involving commitment to 150 minutes exercise per week, heart healthy diet, and  attaining healthy weight.The importance of adequate sleep also discussed. Regular seat belt use and home safety, is also discussed. Changes in health habits are decided on by the patient with goals and time frames  set for achieving them. Immunization and cancer screening needs are specifically addressed at this visit.   Diabetes mellitus type 2 in obese [facility]) Not as well controlled, no med change Tina Wilkins is reminded of the importance of commitment to daily physical activity for 30 minutes or more, as able and the need to limit carbohydrate intake to 30 to 60 grams per meal to help with blood sugar control.   The need to take medication as prescribed, test blood sugar as directed, and to call between visits if there is a concern that blood sugar is uncontrolled is also discussed.   Tina Wilkins is reminded of the importance of daily foot exam, annual eye examination, and good blood sugar, blood pressure and cholesterol control.  Diabetic Labs Latest Ref Rng & Units 07/23/2018 02/06/2018 07/08/2017 06/17/2017 01/27/2017  HbA1c <5.7 % of total Hgb 7.6(H) 7.0(H) 7.2(H) 7.0(H) 7.3(H)  Microalbumin mg/dL 0.5 - - 0.7 -  Micro/Creat Ratio <30 mcg/mg creat - - - 13 -  Chol <200 mg/dL 914 782 - 956 -  HDL >21 mg/dL 65 57 - 79 -  Calc LDL mg/dL (calc) 82 87 - 91 -  Triglycerides <150 mg/dL 93 84 - 85 -  Creatinine 0.50 - 0.99 mg/dL 3.08(M) 5.78(I) 6.96(E) 0.86 0.94   BP/Weight 07/28/2018 02/09/2018 07/15/2017 06/17/2017 04/01/2017 01/29/2017 10/01/2016  Systolic BP 138 140 128 150  132 130 140  Diastolic BP 86 82 84 94 88 84 90  Wt. (Lbs) 255 252 - 256 250.12 251 251  BMI 46.64 46.09 - 46.82 45.75 45.91 45.91   Foot/eye exam completion dates Latest Ref Rng & Units 07/28/2018 06/17/2017  Eye Exam No Retinopathy - -  Foot Form Completion - Done Done   Updated lab needed at/ before next visit.      Morbid obesity Deteriorated. Patient re-educated about  the importance of commitment to a  minimum of 150  minutes of exercise per week.  The importance of healthy food choices with portion control discussed. Encouraged to start a food diary, count calories and to consider  joining a support group. Sample diet sheets offered. Goals set by the patient for the next several months.   Weight /BMI 07/28/2018 02/09/2018 06/17/2017  WEIGHT 255 lb 252 lb 256 lb  HEIGHT 5\' 2"  5\' 2"  5\' 2"   BMI 46.64 kg/m2 46.09 kg/m2 46.82 kg/m2      Need for immunization against influenza After obtaining informed consent, the vaccine is  administered by LPN.

## 2018-07-28 NOTE — Assessment & Plan Note (Signed)

## 2018-07-28 NOTE — Assessment & Plan Note (Signed)
Not as well controlled, no med change [name] is reminded of the importance of commitment to daily physical activity for 30 minutes or more, as able and the need to limit carbohydrate intake to 30 to 60 grams per meal to help with blood sugar control.   The need to take medication as prescribed, test blood sugar as directed, and to call between visits if there is a concern that blood sugar is uncontrolled is also discussed.   [name] is reminded of the importance of daily foot exam, annual eye examination, and good blood sugar, blood pressure and cholesterol control.  Diabetic Labs Latest Ref Rng & Units 07/23/2018 02/06/2018 07/08/2017 06/17/2017 01/27/2017  HbA1c <5.7 % of total Hgb 7.6(H) 7.0(H) 7.2(H) 7.0(H) 7.3(H)  Microalbumin mg/dL 0.5 - - 0.7 -  Micro/Creat Ratio <30 mcg/mg creat - - - 13 -  Chol <200 mg/dL 161166 096161 - 045187 -  HDL >40>50 mg/dL 65 57 - 79 -  Calc LDL mg/dL (calc) 82 87 - 91 -  Triglycerides <150 mg/dL 93 84 - 85 -  Creatinine 0.50 - 0.99 mg/dL 9.81(X1.03(H) 9.14(N1.03(H) 8.29(F1.03(H) 0.86 0.94   BP/Weight 07/28/2018 02/09/2018 07/15/2017 06/17/2017 04/01/2017 01/29/2017 10/01/2016  Systolic BP 138 140 128 150 132 130 140  Diastolic BP 86 82 84 94 88 84 90  Wt. (Lbs) 255 252 - 256 250.12 251 251  BMI 46.64 46.09 - 46.82 45.75 45.91 45.91   Foot/eye exam completion dates Latest Ref Rng & Units 07/28/2018 06/17/2017  Eye Exam No Retinopathy - -  Foot Form Completion - Done Done   Updated lab needed at/ before next visit.

## 2018-07-28 NOTE — Assessment & Plan Note (Signed)
Deteriorated. Patient re-educated about  the importance of commitment to a  minimum of 150 minutes of exercise per week.  The importance of healthy food choices with portion control discussed. Encouraged to start a food diary, count calories and to consider  joining a support group. Sample diet sheets offered. Goals set by the patient for the next several months.   Weight /BMI 07/28/2018 02/09/2018 06/17/2017  WEIGHT 255 lb 252 lb 256 lb  HEIGHT 5\' 2"  5\' 2"  5\' 2"   BMI 46.64 kg/m2 46.09 kg/m2 46.82 kg/m2

## 2018-07-28 NOTE — Assessment & Plan Note (Signed)
After obtaining informed consent, the vaccine is  administered by LPN.  

## 2018-07-28 NOTE — Patient Instructions (Addendum)
F/u 2nd week in December, call if you need me n before  Flu vaccine today  Please leave with lab order for non fasting HBA1C, che,m 7 and eGFR to be drawn 5 to 7 days before next appointment   Please return 3 stool cards next week for colon screen  You are referred to diabetic educator, for help with diabetes,obesity, hyperlipidemia  Your blood sugar is not as well controlled and you have gained weight . I KNOW this will be improved when you return  It is important that you exercise regularly at least 30 minutes 5 times a week. If you develop chest pain, have severe difficulty breathing, or feel very tired, stop exercising immediately and seek medical attention

## 2018-08-05 DIAGNOSIS — H401131 Primary open-angle glaucoma, bilateral, mild stage: Secondary | ICD-10-CM | POA: Diagnosis not present

## 2018-08-05 LAB — HM DIABETES EYE EXAM

## 2018-08-10 ENCOUNTER — Other Ambulatory Visit: Payer: Self-pay | Admitting: Family Medicine

## 2018-08-10 DIAGNOSIS — I10 Essential (primary) hypertension: Secondary | ICD-10-CM

## 2018-08-10 MED FILL — ROSUVASTATIN CALCIUM 20 MG: 20 | 30 days supply | Qty: 30 | Fill #2

## 2018-08-10 MED FILL — BENAZEPRIL HCL 40 MG TABLET: 40 | 30 days supply | Qty: 30 | Fill #2

## 2018-08-10 MED FILL — MONTELUKAST SOD 10 MG TAB: 10 | 30 days supply | Qty: 30 | Fill #3

## 2018-08-10 MED FILL — METOPROLOL SUCCINATE ER 25: 25 | 30 days supply | Qty: 30 | Fill #2

## 2018-08-10 MED FILL — cloNIDine HCL 0.3 MG TABS: 0.3 | 30 days supply | Qty: 30 | Fill #4

## 2018-08-12 MED FILL — TRIAMTERENE-HCTZ 75-50 MG T: 75-50 | 30 days supply | Qty: 30 | Fill #0

## 2018-08-12 MED FILL — PANTOPRAZOLE SOD DR 40 MG T: 40 | 30 days supply | Qty: 30 | Fill #0

## 2018-08-17 ENCOUNTER — Other Ambulatory Visit (INDEPENDENT_AMBULATORY_CARE_PROVIDER_SITE_OTHER): Payer: 59

## 2018-08-17 DIAGNOSIS — Z1211 Encounter for screening for malignant neoplasm of colon: Secondary | ICD-10-CM

## 2018-08-17 LAB — HEMOCCULT GUIAC POC 1CARD (OFFICE)
Card #3 Fecal Occult Blood, POC: NEGATIVE
FECAL OCCULT BLD: NEGATIVE
FECAL OCCULT BLD: NEGATIVE

## 2018-09-10 ENCOUNTER — Other Ambulatory Visit: Payer: Self-pay | Admitting: Family Medicine

## 2018-09-10 MED FILL — TRIAMTERENE-HCTZ 75-50 MG T: 75-50 | 30 days supply | Qty: 30 | Fill #1

## 2018-09-10 MED FILL — VIT D2 1.25 MG (50,000 UNIT: 1.25 MG | 28 days supply | Qty: 4 | Fill #2

## 2018-09-10 MED FILL — METOPROLOL SUCCINATE ER 25: 25 | 30 days supply | Qty: 30 | Fill #3

## 2018-09-10 MED FILL — cloNIDine HCL 0.3 MG TABS: 0.3 | 30 days supply | Qty: 30 | Fill #5

## 2018-09-13 MED FILL — ROSUVASTATIN CALCIUM 20 MG: 20 | 30 days supply | Qty: 30 | Fill #0

## 2018-10-12 ENCOUNTER — Other Ambulatory Visit: Payer: Self-pay | Admitting: Family Medicine

## 2018-10-12 MED FILL — TRIAMTERENE-HCTZ 75-50 MG T: 75-50 | 30 days supply | Qty: 30 | Fill #2

## 2018-10-12 MED FILL — cloNIDine HCL 0.3 MG TABS: 0.3 | 30 days supply | Qty: 30 | Fill #0

## 2018-10-12 MED FILL — ROSUVASTATIN CALCIUM 20 MG: 20 | 30 days supply | Qty: 30 | Fill #1

## 2018-10-12 MED FILL — METOPROLOL SUCCINATE ER 25: 25 | 30 days supply | Qty: 30 | Fill #4

## 2018-11-04 ENCOUNTER — Other Ambulatory Visit: Payer: Self-pay | Admitting: Family Medicine

## 2018-11-04 DIAGNOSIS — Z1231 Encounter for screening mammogram for malignant neoplasm of breast: Secondary | ICD-10-CM

## 2018-11-15 DIAGNOSIS — E1169 Type 2 diabetes mellitus with other specified complication: Secondary | ICD-10-CM | POA: Diagnosis not present

## 2018-11-15 DIAGNOSIS — E669 Obesity, unspecified: Secondary | ICD-10-CM | POA: Diagnosis not present

## 2018-11-16 ENCOUNTER — Encounter: Payer: Self-pay | Admitting: Family Medicine

## 2018-11-16 ENCOUNTER — Ambulatory Visit (INDEPENDENT_AMBULATORY_CARE_PROVIDER_SITE_OTHER): Payer: 59 | Admitting: Family Medicine

## 2018-11-16 VITALS — BP 128/80 | HR 71 | Resp 14 | Ht 62.0 in | Wt 251.0 lb

## 2018-11-16 DIAGNOSIS — E1169 Type 2 diabetes mellitus with other specified complication: Secondary | ICD-10-CM | POA: Diagnosis not present

## 2018-11-16 DIAGNOSIS — E7849 Other hyperlipidemia: Secondary | ICD-10-CM

## 2018-11-16 DIAGNOSIS — E669 Obesity, unspecified: Secondary | ICD-10-CM

## 2018-11-16 DIAGNOSIS — I10 Essential (primary) hypertension: Secondary | ICD-10-CM

## 2018-11-16 DIAGNOSIS — K219 Gastro-esophageal reflux disease without esophagitis: Secondary | ICD-10-CM

## 2018-11-16 DIAGNOSIS — E1159 Type 2 diabetes mellitus with other circulatory complications: Secondary | ICD-10-CM | POA: Diagnosis not present

## 2018-11-16 DIAGNOSIS — R490 Dysphonia: Secondary | ICD-10-CM

## 2018-11-16 LAB — HEMOGLOBIN A1C
HEMOGLOBIN A1C: 7.2 %{Hb} — AB (ref <5.7)
Mean Plasma Glucose: 160 (calc)
eAG (mmol/L): 8.9 (calc)

## 2018-11-16 LAB — BASIC METABOLIC PANEL WITH GFR
BUN: 14 mg/dL (ref 7–25)
CALCIUM: 10 mg/dL (ref 8.6–10.4)
CO2: 29 mmol/L (ref 20–32)
CREATININE: 0.98 mg/dL (ref 0.50–0.99)
Chloride: 102 mmol/L (ref 98–110)
GFR, Est African American: 71 mL/min/{1.73_m2} (ref >=60)
GFR, Est Non African American: 61 mL/min/{1.73_m2} (ref >=60)
Glucose, Bld: 110 mg/dL (ref 65–139)
Potassium: 3.7 mmol/L (ref 3.5–5.3)
Sodium: 142 mmol/L (ref 135–146)

## 2018-11-16 MED ORDER — MONTELUKAST SODIUM 10 MG PO TABS: 10.0000 mg | ORAL_TABLET | Freq: Every day | ORAL | 11 refills | Status: DC

## 2018-11-16 MED ORDER — ROSUVASTATIN CALCIUM 20 MG PO TABS: 20.0000 mg | ORAL_TABLET | Freq: Every day | ORAL | 11 refills | Status: DC

## 2018-11-16 MED ORDER — TRIAMTERENE-HCTZ 75-50 MG PO TABS: 1.0000 | ORAL_TABLET | Freq: Every day | ORAL | 11 refills | Status: DC

## 2018-11-16 MED FILL — cloNIDine HCL 0.3 MG TABS: 0.3 | 30 days supply | Qty: 30 | Fill #1

## 2018-11-16 MED FILL — ROSUVASTATIN CALCIUM 20 MG: 20 | 30 days supply | Qty: 30 | Fill #2

## 2018-11-16 MED FILL — TRIAMTERENE-HCTZ 75-50 MG T: 75-50 | 30 days supply | Qty: 30 | Fill #3

## 2018-11-16 MED FILL — VIT D2 1.25 MG (50,000 UNIT: 1.25 MG | 28 days supply | Qty: 4 | Fill #3

## 2018-11-16 MED FILL — METOPROLOL SUCCINATE ER 25: 25 | 30 days supply | Qty: 30 | Fill #5

## 2018-11-16 MED FILL — metFORMIN HCL 1000 MG TABS: 1000 | 30 days supply | Qty: 60 | Fill #2

## 2018-11-16 MED FILL — MONTELUKAST SOD 10 MG TAB: 10 | 30 days supply | Qty: 30 | Fill #4

## 2018-11-16 NOTE — Progress Notes (Signed)
Tina Wilkins     MRN: [id number]      DOB: 08/22/1956   HPI Tina Wilkins is here for follow up and re-evaluation of chronic medical conditions, medication management and review of any available recent lab and radiology data.  Preventive health is updated, specifically  Cancer screening and Immunization.   Questions or concerns regarding consultations or procedures which the PT has had in the interim are  addressed. The PT denies any adverse reactions to current medications since the last visit.  Denies polyuria, polydipsia, blurred vision , or hypoglycemic episodes. C/o chronic painless hoarseness C/o solid dysphagia intermittently, esp for pills, not taking her PPI regulrly, and drinks caffeine, states unaware that this is a problem  ROS Denies recent fever or chills. Denies sinus pressure, nasal congestion, ear pain or sore throat. Denies chest congestion, productive cough or wheezing. Denies chest pains, palpitations and leg swelling Denies abdominal pain, nausea, vomiting,diarrhea or constipation.   Denies dysuria, frequency, hesitancy or incontinence. Denies joint pain, swelling and limitation in mobility. Denies headaches, seizures, numbness, or tingling. Denies depression, anxiety or insomnia. Denies skin break down or rash.   PE  BP 128/80 (BP Location: Left Arm, Patient Position: Sitting, Cuff Size: Large)   Pulse 71   Resp 14   Ht 5\' 2"  (1.575 m)   Wt 251 lb 0.6 oz (113.9 kg)   SpO2 97% Comment: room air  BMI 45.92 kg/m   Patient alert and oriented and in no cardiopulmonary distress.  HEENT: No facial asymmetry, EOMI,   oropharynx pink and moist.  Neck supple no JVD, no mass.  Chest: Clear to auscultation bilaterally.  CVS: S1, S2 no murmurs, no S3.Regular rate.  ABD: Soft non tender.   Ext: No edema  MS: Adequate ROM spine, shoulders, hips and knees.  Skin: Intact, no ulcerations or rash noted.  Psych: Good eye contact, normal affect. Memory intact  not anxious or depressed appearing.  CNS: CN 2-12 intact, power,  normal throughout.no focal deficits noted.   Assessment & Plan  Hypertension goal BP (blood pressure) < 130/80 Controlled, no change in medication DASH diet and commitment to daily physical activity for a minimum of 30 minutes discussed and encouraged, as a part of hypertension management. The importance of attaining a healthy weight is also discussed.  BP/Weight 11/16/2018 07/28/2018 02/09/2018 07/15/2017 06/17/2017 04/01/2017 01/29/2017  Systolic BP 128 138 140 128 150 132 130  Diastolic BP 80 86 82 84 94 88 84  Wt. (Lbs) 251.04 255 252 - 256 250.12 251  BMI 45.92 46.64 46.09 - 46.82 45.75 45.91       Type 2 diabetes mellitus with vascular disease (HCC) Improved, continue same  Tina Wilkins is reminded of the importance of commitment to daily physical activity for 30 minutes or more, as able and the need to limit carbohydrate intake to 30 to 60 grams per meal to help with blood sugar control.   The need to take medication as prescribed, test blood sugar as directed, and to call between visits if there is a concern that blood sugar is uncontrolled is also discussed.   Tina Wilkins is reminded of the importance of daily foot exam, annual eye examination, and good blood sugar, blood pressure and cholesterol control.  Diabetic Labs Latest Ref Rng & Units 11/15/2018 07/23/2018 02/06/2018 07/08/2017 06/17/2017  HbA1c <5.7 % of total Hgb 7.2(H) 7.6(H) 7.0(H) 7.2(H) 7.0(H)  Microalbumin mg/dL - 0.5 - - 0.7  Micro/Creat Ratio <30 mcg/mg creat - - - -  13  Chol <200 mg/dL - 829 562 - 130  HDL >86 mg/dL - 65 57 - 79  Calc LDL mg/dL (calc) - 82 87 - 91  Triglycerides <150 mg/dL - 93 84 - 85  Creatinine 0.50 - 0.99 mg/dL 5.78 4.69(G) 2.95(M) 8.41(L) 0.86   BP/Weight 11/16/2018 07/28/2018 02/09/2018 07/15/2017 06/17/2017 04/01/2017 01/29/2017  Systolic BP 128 138 140 128 150 132 130  Diastolic BP 80 86 82 84 94 88 84  Wt. (Lbs) 251.04 255 252 -  256 250.12 251  BMI 45.92 46.64 46.09 - 46.82 45.75 45.91   Foot/eye exam completion dates Latest Ref Rng & Units 07/28/2018 06/17/2017  Eye Exam No Retinopathy - -  Foot Form Completion - Done Done        Morbid obesity Deteriorated. Patient re-educated about  the importance of commitment to a  minimum of 150 minutes of exercise per week.  The importance of healthy food choices with portion control discussed. Encouraged to start a food diary, count calories and to consider  joining a support group. Sample diet sheets offered. Goals set by the patient for the next several months.   Weight /BMI 11/16/2018 07/28/2018 02/09/2018  WEIGHT 251 lb 0.6 oz 255 lb 252 lb  HEIGHT 5\' 2"  5\' 2"  5\' 2"   BMI 45.92 kg/m2 46.64 kg/m2 46.09 kg/m2      Chronic GERD Chronic painless hoarseness with dysphagia, refer to ENT and GI  Hyperlipidemia Hyperlipidemia:Low fat diet discussed and encouraged.   Lipid Panel  Lab Results  Component Value Date   CHOL 166 07/23/2018   HDL 65 07/23/2018   LDLCALC 82 07/23/2018   TRIG 93 07/23/2018   CHOLHDL 2.6 07/23/2018    Controlled, no change in medication Updated lab needed  next visit.    Chronic hoarseness Persistent and chronic, ENT to eval

## 2018-11-16 NOTE — Assessment & Plan Note (Signed)
Hyperlipidemia:Low fat diet discussed and encouraged.   Lipid Panel  Lab Results  Component Value Date   CHOL 166 07/23/2018   HDL 65 07/23/2018   LDLCALC 82 07/23/2018   TRIG 93 07/23/2018   CHOLHDL 2.6 07/23/2018    Controlled, no change in medication Updated lab needed  next visit.

## 2018-11-16 NOTE — Assessment & Plan Note (Signed)
Chronic painless hoarseness with dysphagia, refer to ENT and GI

## 2018-11-16 NOTE — Assessment & Plan Note (Signed)
Controlled, no change in medication DASH diet and commitment to daily physical activity for a minimum of 30 minutes discussed and encouraged, as a part of hypertension management. The importance of attaining a healthy weight is also discussed.  BP/Weight 11/16/2018 07/28/2018 02/09/2018 07/15/2017 06/17/2017 04/01/2017 01/29/2017  Systolic BP 128 138 140 128 150 132 130  Diastolic BP 80 86 82 84 94 88 84  Wt. (Lbs) 251.04 255 252 - 256 250.12 251  BMI 45.92 46.64 46.09 - 46.82 45.75 45.91

## 2018-11-16 NOTE — Patient Instructions (Addendum)
F/U in 4 months, call if you need me before  CONGRATS on improvement  Fasting lipid, cmp and EGFr and HBA1C 1 week before f/u  We will send for eye exam in 08/2018 with Dr Azalee Course are referred to ENT amnd GI re reflux and hoarseness with difficulty swallowing  NO caffeine, take protonix daily, and stoop eating 3 hours before bedtime     Gastroesophageal Reflux Disease, Adult Normally, food travels down the esophagus and stays in the stomach to be digested. If a person has gastroesophageal reflux disease (GERD), food and stomach acid move back up into the esophagus. When this happens, the esophagus becomes sore and swollen (inflamed). Over time, GERD can make small holes (ulcers) in the lining of the esophagus. Follow these instructions at home: Diet  Follow a diet as told by your doctor. You may need to avoid foods and drinks such as: ? Coffee and tea (with or without caffeine). ? Drinks that contain alcohol. ? Energy drinks and sports drinks. ? Carbonated drinks or sodas. ? Chocolate and cocoa. ? Peppermint and mint flavorings. ? Garlic and onions. ? Horseradish. ? Spicy and acidic foods, such as peppers, chili powder, curry powder, vinegar, hot sauces, and BBQ sauce. ? Citrus fruit juices and citrus fruits, such as oranges, lemons, and limes. ? Tomato-based foods, such as red sauce, chili, salsa, and pizza with red sauce. ? Fried and fatty foods, such as donuts, french fries, potato chips, and high-fat dressings. ? High-fat meats, such as hot dogs, rib eye steak, sausage, ham, and bacon. ? High-fat dairy items, such as whole milk, butter, and cream cheese.  Eat small meals often. Avoid eating large meals.  Avoid drinking large amounts of liquid with your meals.  Avoid eating meals during the 2-3 hours before bedtime.  Avoid lying down right after you eat.  Do not exercise right after you eat. General instructions  Pay attention to any changes in your  symptoms.  Take over-the-counter and prescription medicines only as told by your doctor. Do not take aspirin, ibuprofen, or other NSAIDs unless your doctor says it is okay.  Do not use any tobacco products, including cigarettes, chewing tobacco, and e-cigarettes. If you need help quitting, ask your doctor.  Wear loose clothes. Do not wear anything tight around your waist.  Raise (elevate) the head of your bed about 6 inches (15 cm).  Try to lower your stress. If you need help doing this, ask your doctor.  If you are overweight, lose an amount of weight that is healthy for you. Ask your doctor about a safe weight loss goal.  Keep all follow-up visits as told by your doctor. This is important. Contact a doctor if:  You have new symptoms.  You lose weight and you do not know why it is happening.  You have trouble swallowing, or it hurts to swallow.  You have wheezing or a cough that keeps happening.  Your symptoms do not get better with treatment.  You have a hoarse voice. Get help right away if:  You have pain in your arms, neck, jaw, teeth, or back.  You feel sweaty, dizzy, or light-headed.  You have chest pain or shortness of breath.  You throw up (vomit) and your throw up looks like blood or coffee grounds.  You pass out (faint).  Your poop (stool) is bloody or black.  You cannot swallow, drink, or eat. This information is not intended to replace advice given to you by your  health care provider. Make sure you discuss any questions you have with your health care provider. Document Released: 05/05/2008 Document Revised: 04/24/2016 Document Reviewed: 03/14/2015 Elsevier Interactive Patient Education  [employer].

## 2018-11-16 NOTE — Assessment & Plan Note (Addendum)
Improved Patient re-educated about  the importance of commitment to a  minimum of 150 minutes of exercise per week.  The importance of healthy food choices with portion control discussed. Encouraged to start a food diary, count calories and to consider  joining a support group. Sample diet sheets offered. Goals set by the patient for the next several months.   Weight /BMI 11/16/2018 07/28/2018 02/09/2018  WEIGHT 251 lb 0.6 oz 255 lb 252 lb  HEIGHT 5\' 2"  5\' 2"  5\' 2"   BMI 45.92 kg/m2 46.64 kg/m2 46.09 kg/m2

## 2018-11-16 NOTE — Assessment & Plan Note (Signed)
Improved, continue same  [name] is reminded of the importance of commitment to daily physical activity for 30 minutes or more, as able and the need to limit carbohydrate intake to 30 to 60 grams per meal to help with blood sugar control.   The need to take medication as prescribed, test blood sugar as directed, and to call between visits if there is a concern that blood sugar is uncontrolled is also discussed.   [name] is reminded of the importance of daily foot exam, annual eye examination, and good blood sugar, blood pressure and cholesterol control.  Diabetic Labs Latest Ref Rng & Units 11/15/2018 07/23/2018 02/06/2018 07/08/2017 06/17/2017  HbA1c <5.7 % of total Hgb 7.2(H) 7.6(H) 7.0(H) 7.2(H) 7.0(H)  Microalbumin mg/dL - 0.5 - - 0.7  Micro/Creat Ratio <30 mcg/mg creat - - - - 13  Chol <200 mg/dL - 409166 811161 - 914187  HDL >78>50 mg/dL - 65 57 - 79  Calc LDL mg/dL (calc) - 82 87 - 91  Triglycerides <150 mg/dL - 93 84 - 85  Creatinine 0.50 - 0.99 mg/dL 2.950.98 6.21(H1.03(H) 0.86(V1.03(H) 7.84(O1.03(H) 0.86   BP/Weight 11/16/2018 07/28/2018 02/09/2018 07/15/2017 06/17/2017 04/01/2017 01/29/2017  Systolic BP 128 138 140 128 150 132 130  Diastolic BP 80 86 82 84 94 88 84  Wt. (Lbs) 251.04 255 252 - 256 250.12 251  BMI 45.92 46.64 46.09 - 46.82 45.75 45.91   Foot/eye exam completion dates Latest Ref Rng & Units 07/28/2018 06/17/2017  Eye Exam No Retinopathy - -  Foot Form Completion - Done Done

## 2018-11-16 NOTE — Assessment & Plan Note (Signed)
Persistent and chronic, ENT to eval

## 2018-11-30 ENCOUNTER — Encounter: Payer: Self-pay | Admitting: *Deleted

## 2018-12-09 ENCOUNTER — Encounter (INDEPENDENT_AMBULATORY_CARE_PROVIDER_SITE_OTHER): Payer: Self-pay | Admitting: *Deleted

## 2018-12-09 ENCOUNTER — Ambulatory Visit (INDEPENDENT_AMBULATORY_CARE_PROVIDER_SITE_OTHER): Payer: 59 | Admitting: Internal Medicine

## 2018-12-09 ENCOUNTER — Encounter (INDEPENDENT_AMBULATORY_CARE_PROVIDER_SITE_OTHER): Payer: Self-pay | Admitting: Internal Medicine

## 2018-12-09 VITALS — BP 170/90 | HR 64 | Temp 97.7°F | Ht 62.0 in | Wt 252.8 lb

## 2018-12-09 DIAGNOSIS — R1310 Dysphagia, unspecified: Secondary | ICD-10-CM | POA: Diagnosis not present

## 2018-12-09 DIAGNOSIS — R1319 Other dysphagia: Secondary | ICD-10-CM

## 2018-12-09 NOTE — Progress Notes (Signed)
Subjective:    Patient ID: Tina Wilkins, female    DOB: 06/08/1956, 63 y.o.   MRN: [id number]  HPI Referred by Dr. Lodema HongSimpson for GERD/interimttent dysphagia. Dysphagia off and on for about a year. Trouble more with pills. She thinks while she is lying down, she can fill the reflux backing up. Started the Protonix x 3 years.  If she eats "fast", she strangles. No problems with meats or breads. Strangles on her own saliva. Her appetite is good. No weight loss. BMs are normal.     Review of Systems     Past Medical History:  Diagnosis Date  . Essential hypertension 2005  . Hyperlipidemia 2005  . Obesity   . Seasonal allergies   . Type 2 diabetes mellitus (HCC) 2010    Past Surgical History:  Procedure Laterality Date  . ABDOMINAL HYSTERECTOMY  2004   Secondary to fibroids, single ovary left  . CESAREAN SECTION  1989   2 vaginal deliveries before  . SHOULDER SURGERY Left     Allergies  Allergen Reactions  . Celecoxib   . Nolamine [Chlorphen-Phenind-Phenylprop]   . Sitagliptin-Metformin Hcl   . Skelaxin     Current Outpatient Medications on File Prior to Visit  Medication Sig Dispense Refill  . acetaminophen (TYLENOL) 500 MG tablet Take 500 mg by mouth every 6 (six) hours as needed.    . Alcohol Swabs (ALCOHOL PREPS) PADS Use as directed when testing blood glucose 100 each 5  . benazepril (LOTENSIN) 40 MG tablet TAKE 1 TABLET BY MOUTH DAILY 90 tablet 1  . calcium-vitamin D (OSCAL WITH D) 500-200 MG-UNIT per tablet Take 1 tablet by mouth 2 (two) times daily.    . cloNIDine (CATAPRES) 0.3 MG tablet TAKE 1 TABLET BY MOUTH AT BEDTIME 90 tablet 1  . fluticasone (FLONASE) 50 MCG/ACT nasal spray Place 2 sprays into both nostrils daily. 48 g 3  . glucose blood (TRUETRACK TEST) test strip USE AS INSTRUCTED FOR ONCE DAILY TESTING 100 each 3  . LUMIGAN 0.01 % SOLN Place 1 drop into both eyes daily.    . metFORMIN (GLUCOPHAGE) 1000 MG tablet TAKE 1 TABLET BY MOUTH 2 TIMES DAILY  WITH A MEAL. 180 tablet 1  . metoprolol succinate (TOPROL-XL) 25 MG 24 hr tablet TAKE 1 TABLET BY MOUTH ONCE DAILY 30 tablet 6  . montelukast (SINGULAIR) 10 MG tablet Take 1 tablet (10 mg total) by mouth at bedtime. 30 tablet 11  . rosuvastatin (CRESTOR) 20 MG tablet Take 1 tablet (20 mg total) by mouth daily. 30 tablet 11  . triamterene-hydrochlorothiazide (MAXZIDE) 75-50 MG tablet Take 1 tablet by mouth daily. 30 tablet 11  . TRUEPLUS LANCETS 30G MISC USE TO TEST BLOOD SUGAR ONCE A DAY 100 each 1  . Vitamin D, Ergocalciferol, (DRISDOL) 50000 units CAPS capsule TAKE 1 CAPSULE (50,000 UNITS) BY MOUTH ONCE A WEEK. 12 capsule 1  . pantoprazole (PROTONIX) 40 MG tablet TAKE 1 TABLET (40 MG TOTAL) BY MOUTH DAILY. 90 tablet 1   No current facility-administered medications on file prior to visit.      Objective:   Physical Exam Blood pressure (!) 170/90, pulse 64, temperature 97.7 F (36.5 C), height 5\' 2"  (1.575 m), weight 252 lb 12.8 oz (114.7 kg). Alert and oriented. Skin warm and dry. Oral mucosa is moist.   . Sclera anicteric, conjunctivae is pink. Thyroid not enlarged. No cervical lymphadenopathy. Lungs clear. Heart regular rate and rhythm.  Abdomen is soft. Bowel sounds are positive.  No hepatomegaly. No abdominal masses felt. No tenderness.  No edema to lower extremities.          Assessment & Plan:  Pill dysphagia: DG Esophagram. Further recommendations to follow.

## 2018-12-09 NOTE — Patient Instructions (Signed)
DG esophagram.   

## 2018-12-13 ENCOUNTER — Ambulatory Visit (HOSPITAL_COMMUNITY)
Admission: RE | Admit: 2018-12-13 | Discharge: 2018-12-13 | Disposition: A | Discharge status: Home / Self Care | Payer: 59 | Source: Ambulatory Visit | Attending: Internal Medicine | Admitting: Internal Medicine

## 2018-12-13 ENCOUNTER — Ambulatory Visit (HOSPITAL_COMMUNITY)
Admission: RE | Admit: 2018-12-13 | Discharge: 2018-12-13 | Disposition: A | Discharge status: Home / Self Care | Payer: 59 | Source: Ambulatory Visit | Attending: Family Medicine | Admitting: Family Medicine

## 2018-12-13 DIAGNOSIS — R1310 Dysphagia, unspecified: Secondary | ICD-10-CM | POA: Insufficient documentation

## 2018-12-13 DIAGNOSIS — K449 Diaphragmatic hernia without obstruction or gangrene: Secondary | ICD-10-CM | POA: Diagnosis not present

## 2018-12-13 DIAGNOSIS — Z1231 Encounter for screening mammogram for malignant neoplasm of breast: Secondary | ICD-10-CM | POA: Insufficient documentation

## 2018-12-13 DIAGNOSIS — R1319 Other dysphagia: Secondary | ICD-10-CM

## 2018-12-17 MED FILL — ROSUVASTATIN CALCIUM 20 MG: 20 | 30 days supply | Qty: 30 | Fill #0

## 2018-12-17 MED FILL — METOPROLOL SUCCINATE ER 25: 25 | 30 days supply | Qty: 30 | Fill #6

## 2018-12-17 MED FILL — TRIAMTERENE-HCTZ 75-50 MG T: 75-50 | 30 days supply | Qty: 30 | Fill #4

## 2018-12-17 MED FILL — cloNIDine HCL 0.3 MG TABS: 0.3 | 30 days supply | Qty: 30 | Fill #2

## 2018-12-20 ENCOUNTER — Ambulatory Visit (INDEPENDENT_AMBULATORY_CARE_PROVIDER_SITE_OTHER): Payer: 59 | Admitting: Otolaryngology

## 2018-12-20 DIAGNOSIS — R490 Dysphonia: Secondary | ICD-10-CM

## 2018-12-20 DIAGNOSIS — K219 Gastro-esophageal reflux disease without esophagitis: Secondary | ICD-10-CM | POA: Diagnosis not present

## 2018-12-21 ENCOUNTER — Other Ambulatory Visit: Payer: Self-pay | Admitting: Family Medicine

## 2018-12-21 ENCOUNTER — Telehealth: Payer: Self-pay | Admitting: Family Medicine

## 2018-12-21 DIAGNOSIS — E1159 Type 2 diabetes mellitus with other circulatory complications: Secondary | ICD-10-CM

## 2018-12-21 MED FILL — FAMOTIDINE 20 MG TABLET: 20 | 30 days supply | Qty: 30 | Fill #0

## 2018-12-21 NOTE — Telephone Encounter (Signed)
PT is calling she needs a new RX fopr Accu Check- Meter,  needles , everything

## 2018-12-22 MED ORDER — BLOOD GLUCOSE METER KIT: PACK | 0 refills | Status: DC

## 2018-12-22 NOTE — Telephone Encounter (Signed)
rx for all sent in as requested

## 2018-12-22 NOTE — Addendum Note (Signed)
Addended by: Abner Greenspan on: 12/22/2018 09:42 AM   Modules accepted: Orders

## 2018-12-30 DIAGNOSIS — H401133 Primary open-angle glaucoma, bilateral, severe stage: Secondary | ICD-10-CM | POA: Diagnosis not present

## 2018-12-30 MED FILL — RESTASIS 0.05% EYE EMULSION: 0.05 | 30 days supply | Qty: 60 | Fill #0

## 2018-12-30 MED FILL — ACCU-CHEK FASTCLIX LANCETS: 34 days supply | Qty: 102 | Fill #0

## 2018-12-30 MED FILL — ACCU-CHEK GUIDE W/DEVICE KI: W/DEVICE | 1 days supply | Qty: 1 | Fill #0

## 2018-12-30 MED FILL — LUMIGAN 0.01% EYE DROPS: 0.01 | 25 days supply | Qty: 3 | Fill #0

## 2018-12-30 MED FILL — ACCU-CHEK GUIDE TEST STRIP: 34 days supply | Qty: 50 | Fill #0

## 2019-01-20 ENCOUNTER — Other Ambulatory Visit: Payer: Self-pay | Admitting: Cardiology

## 2019-01-20 MED FILL — TRIAMTERENE-HCTZ 75-50 MG T: 75-50 | 30 days supply | Qty: 30 | Fill #5

## 2019-01-20 MED FILL — ROSUVASTATIN CALCIUM 20 MG: 20 | 30 days supply | Qty: 30 | Fill #1

## 2019-01-20 MED FILL — FAMOTIDINE 20 MG TABLET: 20 | 30 days supply | Qty: 30 | Fill #1

## 2019-01-20 MED FILL — METOPROLOL SUCCINATE ER 25: 25 | 30 days supply | Qty: 30 | Fill #0

## 2019-01-20 MED FILL — cloNIDine HCL 0.3 MG TABS: 0.3 | 30 days supply | Qty: 30 | Fill #3

## 2019-02-18 MED FILL — ROSUVASTATIN CALCIUM 20 MG: 20 | 30 days supply | Qty: 30 | Fill #2

## 2019-02-18 MED FILL — METOPROLOL SUCCINATE ER 25: 25 | 30 days supply | Qty: 30 | Fill #1

## 2019-02-18 MED FILL — FAMOTIDINE 20 MG TABLET: 20 | 30 days supply | Qty: 30 | Fill #2

## 2019-02-18 MED FILL — cloNIDine HCL 0.3 MG TABS: 0.3 | 30 days supply | Qty: 30 | Fill #4

## 2019-02-18 MED FILL — TRIAMTERENE-HCTZ 75-50 MG T: 75-50 | 30 days supply | Qty: 30 | Fill #0

## 2019-02-21 ENCOUNTER — Ambulatory Visit (INDEPENDENT_AMBULATORY_CARE_PROVIDER_SITE_OTHER): Payer: 59 | Admitting: Otolaryngology

## 2019-02-21 DIAGNOSIS — R490 Dysphonia: Secondary | ICD-10-CM | POA: Diagnosis not present

## 2019-02-21 DIAGNOSIS — K219 Gastro-esophageal reflux disease without esophagitis: Secondary | ICD-10-CM | POA: Diagnosis not present

## 2019-02-21 MED FILL — MAGIC MOUTHWASH BOP FORM: 13 days supply | Qty: 250 | Fill #0

## 2019-03-21 DIAGNOSIS — E7849 Other hyperlipidemia: Secondary | ICD-10-CM | POA: Diagnosis not present

## 2019-03-21 DIAGNOSIS — E1169 Type 2 diabetes mellitus with other specified complication: Secondary | ICD-10-CM | POA: Diagnosis not present

## 2019-03-22 ENCOUNTER — Ambulatory Visit: Payer: 59 | Admitting: Family Medicine

## 2019-03-22 LAB — COMPLETE METABOLIC PANEL WITH GFR
AG Ratio: 1.5 (calc) (ref 1.0–2.5)
ALT: 19 U/L (ref 6–29)
AST: 18 U/L (ref 10–35)
Albumin: 4.5 g/dL (ref 3.6–5.1)
Alkaline phosphatase (APISO): 95 U/L (ref 37–153)
BUN/Creatinine Ratio: 15 (calc) (ref 6–22)
BUN: 16 mg/dL (ref 7–25)
CO2: 30 mmol/L (ref 20–32)
Calcium: 10.5 mg/dL — ABNORMAL HIGH (ref 8.6–10.4)
Chloride: 102 mmol/L (ref 98–110)
Creat: 1.04 mg/dL — ABNORMAL HIGH (ref 0.50–0.99)
GFR, Est African American: 66 mL/min/{1.73_m2} (ref >=60)
GFR, Est Non African American: 57 mL/min/{1.73_m2} — ABNORMAL LOW (ref >=60)
Globulin: 3 g/dL (calc) (ref 1.9–3.7)
Glucose, Bld: 115 mg/dL — ABNORMAL HIGH (ref 65–99)
Potassium: 4 mmol/L (ref 3.5–5.3)
Sodium: 142 mmol/L (ref 135–146)
Total Bilirubin: 0.8 mg/dL (ref 0.2–1.2)
Total Protein: 7.5 g/dL (ref 6.1–8.1)

## 2019-03-22 LAB — HEMOGLOBIN A1C
EAG (MMOL/L): 9.8 (calc)
Hgb A1c MFr Bld: 7.8 % of total Hgb — ABNORMAL HIGH (ref <5.7)
Mean Plasma Glucose: 177 (calc)

## 2019-03-22 LAB — LIPID PANEL
Cholesterol: 281 mg/dL — ABNORMAL HIGH (ref <200)
HDL: 59 mg/dL (ref >=50)
LDL Cholesterol (Calc): 194 mg/dL (calc) — ABNORMAL HIGH
Non-HDL Cholesterol (Calc): 222 mg/dL (calc) — ABNORMAL HIGH (ref <130)
Total CHOL/HDL Ratio: 4.8 (calc) (ref <5.0)
Triglycerides: 130 mg/dL (ref <150)

## 2019-03-23 MED FILL — METOPROLOL SUCCINATE ER 25: 25 | 30 days supply | Qty: 30 | Fill #2

## 2019-03-23 MED FILL — TRIAMTERENE-HCTZ 75-50 MG T: 75-50 | 30 days supply | Qty: 30 | Fill #1

## 2019-03-23 MED FILL — ROSUVASTATIN CALCIUM 20 MG: 20 | 30 days supply | Qty: 30 | Fill #3

## 2019-03-23 MED FILL — cloNIDine HCL 0.3 MG TABS: 0.3 | 30 days supply | Qty: 30 | Fill #5

## 2019-03-29 ENCOUNTER — Other Ambulatory Visit: Payer: Self-pay

## 2019-03-29 ENCOUNTER — Ambulatory Visit (INDEPENDENT_AMBULATORY_CARE_PROVIDER_SITE_OTHER): Payer: 59 | Admitting: Family Medicine

## 2019-03-29 ENCOUNTER — Encounter: Payer: Self-pay | Admitting: Family Medicine

## 2019-03-29 VITALS — BP 130/88 | Resp 15 | Ht 62.0 in | Wt 250.0 lb

## 2019-03-29 DIAGNOSIS — I10 Essential (primary) hypertension: Secondary | ICD-10-CM | POA: Diagnosis not present

## 2019-03-29 DIAGNOSIS — E1159 Type 2 diabetes mellitus with other circulatory complications: Secondary | ICD-10-CM

## 2019-03-29 DIAGNOSIS — E559 Vitamin D deficiency, unspecified: Secondary | ICD-10-CM

## 2019-03-29 DIAGNOSIS — E7849 Other hyperlipidemia: Secondary | ICD-10-CM | POA: Diagnosis not present

## 2019-03-29 MED ORDER — MONTELUKAST SODIUM 10 MG PO TABS: 10.0000 mg | ORAL_TABLET | Freq: Every day | ORAL | 1 refills | Status: DC

## 2019-03-29 MED ORDER — TRIAMTERENE-HCTZ 75-50 MG PO TABS: 1.0000 | ORAL_TABLET | Freq: Every day | ORAL | 11 refills | Status: DC

## 2019-03-29 MED ORDER — METFORMIN HCL 1000 MG PO TABS: ORAL_TABLET | ORAL | 1 refills | Status: DC

## 2019-03-29 MED ORDER — ROSUVASTATIN CALCIUM 20 MG PO TABS: 20.0000 mg | ORAL_TABLET | Freq: Every day | ORAL | 1 refills | Status: DC

## 2019-03-29 MED ORDER — CLONIDINE HCL 0.3 MG PO TABS: 0.3000 mg | ORAL_TABLET | Freq: Every day | ORAL | 1 refills | Status: DC

## 2019-03-29 MED ORDER — ROSUVASTATIN CALCIUM 40 MG PO TABS: 40.0000 mg | ORAL_TABLET | Freq: Every day | ORAL | 3 refills | Status: DC

## 2019-03-29 MED ORDER — PANTOPRAZOLE SODIUM 40 MG PO TBEC: 40.0000 mg | DELAYED_RELEASE_TABLET | Freq: Every day | ORAL | 1 refills | Status: DC

## 2019-03-29 MED ORDER — BENAZEPRIL HCL 40 MG PO TABS: 40.0000 mg | ORAL_TABLET | Freq: Every day | ORAL | 1 refills | Status: DC

## 2019-03-29 MED FILL — ROSUVASTATIN CALCIUM 40 MG: 40 | 30 days supply | Qty: 30 | Fill #0

## 2019-03-29 MED FILL — MONTELUKAST SOD 10 MG TAB: 10 | 30 days supply | Qty: 30 | Fill #0

## 2019-03-29 MED FILL — PANTOPRAZOLE SOD DR 40 MG T: 40 | 30 days supply | Qty: 30 | Fill #0

## 2019-03-29 MED FILL — BENAZEPRIL HCL 40 MG TABLET: 40 | 30 days supply | Qty: 30 | Fill #0

## 2019-03-29 MED FILL — metFORMIN HCL 1000 MG TABS: 1000 | 30 days supply | Qty: 60 | Fill #0

## 2019-03-29 NOTE — Patient Instructions (Addendum)
Welcome to medicare end [facility]  Please get fasting lipid, cmp and eGFr, hBA1C and vitamin D end July  Nurse to call today withappt info for shingrix # 1 and  2 , needs both before August 1, hoping to get the first this week , Nurse will arrange  PLEASE change food choice as both cholesterol and blood sugar have increased.  Please take metformin TWO times daily as prescribed  Dose of crestor has increased to 40 mg  It is important that you exercise regularly at least 30 minutes 5 times a week. If you develop chest pain, have severe difficulty breathing, or feel very tired, stop exercising immediately and seek medical attention   Think about what you will eat, plan ahead. Choose " clean, green, fresh or frozen" over canned, processed or packaged foods which are more sugary, salty and fatty. 70 to 75% of food eaten should be vegetables and fruit. Three meals at set times with snacks allowed between meals, but they must be fruit or vegetables. Aim to eat over a 12 hour period , example 7 am to 7 pm, and STOP after  your last meal of the day. Drink water,generally about 64 ounces per day, no other drink is as healthy. Fruit juice is best enjoyed in a healthy way, by EATING the fruit.  Social distancing. Frequent hand washing with soap and water Keeping your hands off of your face and wear a face mask when not at home These 3 practices will help to keep both you and your community healthy during this time. Please practice them faithfully!

## 2019-03-29 NOTE — Progress Notes (Signed)
Virtual Visit via Telephone Note  I connected with Tina Wilkins on 03/29/19 at  9:40 AM EDT by telephone and verified that I am speaking with the correct person using two identifiers.   I discussed the limitations, risks, security and privacy concerns of performing an evaluation and management service by telephone and the availability of in person appointments. I also discussed with the patient that there may be a patient responsible charge related to this service. The patient expressed understanding and agreed to proceed. Pt is in her home and I am at the office Webex visit successful   History of Present Illness: F/u chronic problems and review labs Denies recent fever or chills. Denies sinus pressure, nasal congestion, ear pain or sore throat. Denies chest congestion, productive cough or wheezing. Denies chest pains, palpitations and leg swelling Denies abdominal pain, nausea, vomiting,diarrhea or constipation.   Denies dysuria, frequency, hesitancy or incontinence. Denies joint pain, swelling and limitation in mobility. Denies headaches, seizures, numbness, or tingling. Denies depression, anxiety or insomnia. Denies skin break down or rash. Denies polyuria, polydipsia, blurred vision , or hypoglycemic episodes. States exercise not as good as in the past due to stay at home order, also has been eating increased sweets and carbs       Observations/Objective:  BP 130/88   Resp 15   Ht 5\' 2"  (1.575 m)   Wt 250 lb (113.4 kg)   SpO2 97%   BMI 45.73 kg/m  Good communication with no confusion and intact memory. Alert and oriented x 3 No signs of respiratory distress during sppech   Assessment and Plan: Hypertension goal BP (blood pressure) < 130/80 Not adequately controlled. Needs lifestyle change and reduction in salt DASH diet and commitment to daily physical activity for a minimum of 30 minutes discussed and encouraged, as a part of hypertension management. The  importance of attaining a healthy weight is also discussed.  BP/Weight 03/29/2019 12/09/2018 11/16/2018 07/28/2018 02/09/2018 07/15/2017 06/17/2017  Systolic BP 130 170 128 138 140 128 150  Diastolic BP 88 90 80 86 82 84 94  Wt. (Lbs) 250 252.8 251.04 255 252 - 256  BMI 45.73 46.24 45.92 46.64 46.09 - 46.82       Type 2 diabetes mellitus with vascular disease (HCC) Tina Wilkins is reminded of the importance of commitment to daily physical activity for 30 minutes or more, as able and the need to limit carbohydrate intake to 30 to 60 grams per meal to help with blood sugar control.   The need to take medication as prescribed, test blood sugar as directed, and to call between visits if there is a concern that blood sugar is uncontrolled is also discussed.   Tina Wilkins is reminded of the importance of daily foot exam, annual eye examination, and good blood sugar, blood pressure and cholesterol control.  Diabetic Labs Latest Ref Rng & Units 03/21/2019 11/15/2018 07/23/2018 02/06/2018 07/08/2017  HbA1c <5.7 % of total Hgb 7.8(H) 7.2(H) 7.6(H) 7.0(H) 7.2(H)  Microalbumin mg/dL - - 0.5 - -  Micro/Creat Ratio <30 mcg/mg creat - - - - -  Chol <200 mg/dL 563(S) - 937 342 -  HDL > OR = 50 mg/dL 59 - 65 57 -  Calc LDL mg/dL (calc) 876(O) - 82 87 -  Triglycerides <150 mg/dL 115 - 93 84 -  Creatinine 0.50 - 0.99 mg/dL 7.26(O) 0.35 5.97(C) 1.63(A) 1.03(H)   BP/Weight 03/29/2019 12/09/2018 11/16/2018 07/28/2018 02/09/2018 07/15/2017 06/17/2017  Systolic BP 130 170 128 138 140  128 150  Diastolic BP 88 90 80 86 82 84 94  Wt. (Lbs) 250 252.8 251.04 255 252 - 256  BMI 45.73 46.24 45.92 46.64 46.09 - 46.82   Foot/eye exam completion dates Latest Ref Rng & Units 08/05/2018 07/28/2018  Eye Exam No Retinopathy No Retinopathy -  Foot Form Completion - - Done   les well control, has deteriorated, needs to change food choice and work on weight loss. Increase metformin to prescribed dose of TWO  daily     Hyperlipidemia Uncontrolled, needs to reduce fat itake and increase medication dose Hyperlipidemia:Low fat diet discussed and encouraged.   Lipid Panel  Lab Results  Component Value Date   CHOL 281 (H) 03/21/2019   HDL 59 03/21/2019   LDLCALC 194 (H) 03/21/2019   TRIG 130 03/21/2019   CHOLHDL 4.8 03/21/2019       Morbid obesity   Patient re-educated about  the importance of commitment to a  minimum of 150 minutes of exercise per week as able.  The importance of healthy food choices with portion control discussed, as well as eating regularly and within a 12 hour window most days. The need to choose "clean , green" food 50 to 75% of the time is discussed, as well as to make water the primary drink and set a goal of 64 ounces water daily.  Encouraged to start a food diary,  and to consider  joining a support group. Sample diet sheets offered. Goals set by the patient for the next several months.   Weight /BMI 03/29/2019 12/09/2018 11/16/2018  WEIGHT 250 lb 252 lb 12.8 oz 251 lb 0.6 oz  HEIGHT 5\' 2"  5\' 2"  5\' 2"   BMI 45.73 kg/m2 46.24 kg/m2 45.92 kg/m2        Follow Up Instructions:    I discussed the assessment and treatment plan with the patient. The patient was provided an opportunity to ask questions and all were answered. The patient agreed with the plan and demonstrated an understanding of the instructions.   The patient was advised to call back or seek an in-person evaluation if the symptoms worsen or if the condition fails to improve as anticipated.  I provided 25 minutes of non-face-to-face time during this encounter.   Syliva OvermanMargaret Simpson, MD

## 2019-04-03 ENCOUNTER — Encounter: Payer: Self-pay | Admitting: Family Medicine

## 2019-04-03 NOTE — Assessment & Plan Note (Signed)
Tina Wilkins is reminded of the importance of commitment to daily physical activity for 30 minutes or more, as able and the need to limit carbohydrate intake to 30 to 60 grams per meal to help with blood sugar control.   The need to take medication as prescribed, test blood sugar as directed, and to call between visits if there is a concern that blood sugar is uncontrolled is also discussed.   Tina Wilkins is reminded of the importance of daily foot exam, annual eye examination, and good blood sugar, blood pressure and cholesterol control.  Diabetic Labs Latest Ref Rng & Units 03/21/2019 11/15/2018 07/23/2018 02/06/2018 07/08/2017  HbA1c <5.7 % of total Hgb 7.8(H) 7.2(H) 7.6(H) 7.0(H) 7.2(H)  Microalbumin mg/dL - - 0.5 - -  Micro/Creat Ratio <30 mcg/mg creat - - - - -  Chol <200 mg/dL 503(T) - 465 681 -  HDL > OR = 50 mg/dL 59 - 65 57 -  Calc LDL mg/dL (calc) 275(T) - 82 87 -  Triglycerides <150 mg/dL 700 - 93 84 -  Creatinine 0.50 - 0.99 mg/dL 1.74(B) 4.49 6.75(F) 1.63(W) 1.03(H)   BP/Weight 03/29/2019 12/09/2018 11/16/2018 07/28/2018 02/09/2018 07/15/2017 06/17/2017  Systolic BP 130 170 128 138 140 128 150  Diastolic BP 88 90 80 86 82 84 94  Wt. (Lbs) 250 252.8 251.04 255 252 - 256  BMI 45.73 46.24 45.92 46.64 46.09 - 46.82   Foot/eye exam completion dates Latest Ref Rng & Units 08/05/2018 07/28/2018  Eye Exam No Retinopathy No Retinopathy -  Foot Form Completion - - Done   les well control, has deteriorated, needs to change food choice and work on weight loss. Increase metformin to prescribed dose of TWO daily

## 2019-04-03 NOTE — Assessment & Plan Note (Signed)
Uncontrolled, needs to reduce fat itake and increase medication dose Hyperlipidemia:Low fat diet discussed and encouraged.   Lipid Panel  Lab Results  Component Value Date   CHOL 281 (H) 03/21/2019   HDL 59 03/21/2019   LDLCALC 194 (H) 03/21/2019   TRIG 130 03/21/2019   CHOLHDL 4.8 03/21/2019

## 2019-04-03 NOTE — Assessment & Plan Note (Signed)
   Patient re-educated about  the importance of commitment to a  minimum of 150 minutes of exercise per week as able.  The importance of healthy food choices with portion control discussed, as well as eating regularly and within a 12 hour window most days. The need to choose "clean , green" food 50 to 75% of the time is discussed, as well as to make water the primary drink and set a goal of 64 ounces water daily.  Encouraged to start a food diary,  and to consider  joining a support group. Sample diet sheets offered. Goals set by the patient for the next several months.   Weight /BMI 03/29/2019 12/09/2018 11/16/2018  WEIGHT 250 lb 252 lb 12.8 oz 251 lb 0.6 oz  HEIGHT 5\' 2"  5\' 2"  5\' 2"   BMI 45.73 kg/m2 46.24 kg/m2 45.92 kg/m2

## 2019-04-03 NOTE — Assessment & Plan Note (Signed)
Not adequately controlled. Needs lifestyle change and reduction in salt DASH diet and commitment to daily physical activity for a minimum of 30 minutes discussed and encouraged, as a part of hypertension management. The importance of attaining a healthy weight is also discussed.  BP/Weight 03/29/2019 12/09/2018 11/16/2018 07/28/2018 02/09/2018 07/15/2017 06/17/2017  Systolic BP 130 170 128 138 140 128 150  Diastolic BP 88 90 80 86 82 84 94  Wt. (Lbs) 250 252.8 251.04 255 252 - 256  BMI 45.73 46.24 45.92 46.64 46.09 - 46.82

## 2019-04-05 ENCOUNTER — Ambulatory Visit: Payer: 59

## 2019-04-05 ENCOUNTER — Other Ambulatory Visit: Payer: Self-pay

## 2019-04-06 ENCOUNTER — Ambulatory Visit (INDEPENDENT_AMBULATORY_CARE_PROVIDER_SITE_OTHER): Payer: 59

## 2019-04-06 DIAGNOSIS — Z23 Encounter for immunization: Secondary | ICD-10-CM

## 2019-04-22 MED FILL — LUMIGAN 0.01% EYE DROPS: 0.01 | 25 days supply | Qty: 3 | Fill #1

## 2019-04-22 MED FILL — ROSUVASTATIN CALCIUM 40 MG: 40 | 30 days supply | Qty: 30 | Fill #1

## 2019-04-22 MED FILL — TRIAMTERENE-HCTZ 75-50 MG T: 75-50 | 30 days supply | Qty: 30 | Fill #2

## 2019-04-22 MED FILL — METOPROLOL SUCCINATE ER 25: 25 | 30 days supply | Qty: 30 | Fill #3

## 2019-04-22 MED FILL — cloNIDine HCL 0.3 MG TABS: 0.3 | 30 days supply | Qty: 30 | Fill #0

## 2019-05-02 ENCOUNTER — Encounter (INDEPENDENT_AMBULATORY_CARE_PROVIDER_SITE_OTHER): Payer: Self-pay

## 2019-05-02 ENCOUNTER — Other Ambulatory Visit: Payer: Self-pay

## 2019-05-02 ENCOUNTER — Ambulatory Visit: Payer: Self-pay

## 2019-05-23 ENCOUNTER — Ambulatory Visit (INDEPENDENT_AMBULATORY_CARE_PROVIDER_SITE_OTHER): Payer: 59 | Admitting: Otolaryngology

## 2019-05-23 ENCOUNTER — Encounter: Payer: Self-pay | Admitting: Family Medicine

## 2019-05-23 DIAGNOSIS — R490 Dysphonia: Secondary | ICD-10-CM | POA: Diagnosis not present

## 2019-05-23 DIAGNOSIS — K219 Gastro-esophageal reflux disease without esophagitis: Secondary | ICD-10-CM

## 2019-05-24 MED FILL — ROSUVASTATIN CALCIUM 40 MG: 40 | 30 days supply | Qty: 30 | Fill #2

## 2019-05-24 MED FILL — cloNIDine HCL 0.3 MG TABS: 0.3 | 30 days supply | Qty: 30 | Fill #1

## 2019-05-24 MED FILL — TRIAMTERENE-HCTZ 75-50 MG T: 75-50 | 30 days supply | Qty: 30 | Fill #3

## 2019-05-24 MED FILL — LUMIGAN 0.01% EYE DROPS: 0.01 | 25 days supply | Qty: 3 | Fill #2

## 2019-05-24 MED FILL — METOPROLOL SUCCINATE ER 25: 25 | 30 days supply | Qty: 30 | Fill #4

## 2019-06-22 ENCOUNTER — Other Ambulatory Visit: Payer: Self-pay | Admitting: Family Medicine

## 2019-06-22 MED FILL — cloNIDine HCL 0.3 MG TABS: 0.3 | 30 days supply | Qty: 30 | Fill #2

## 2019-06-22 MED FILL — ROSUVASTATIN CALCIUM 40 MG: 40 | 30 days supply | Qty: 30 | Fill #3

## 2019-06-22 MED FILL — METOPROLOL SUCCINATE ER 25: 25 | 30 days supply | Qty: 30 | Fill #5

## 2019-06-22 MED FILL — TRIAMTERENE-HCTZ 75-50 MG T: 75-50 | 30 days supply | Qty: 30 | Fill #4

## 2019-06-23 MED FILL — VIT D2 1.25 MG (50,000 UNIT: 1.25 MG | 28 days supply | Qty: 4 | Fill #0

## 2019-06-28 ENCOUNTER — Ambulatory Visit: Payer: 59

## 2019-06-28 ENCOUNTER — Ambulatory Visit (INDEPENDENT_AMBULATORY_CARE_PROVIDER_SITE_OTHER): Payer: 59

## 2019-06-28 ENCOUNTER — Other Ambulatory Visit: Payer: Self-pay

## 2019-06-28 DIAGNOSIS — Z23 Encounter for immunization: Secondary | ICD-10-CM

## 2019-06-28 NOTE — Progress Notes (Signed)
Vaccine given in left deltoid. No immediate reactions. 2nd in series. Patient tolerated well

## 2019-06-29 LAB — COMPLETE METABOLIC PANEL WITH GFR
AG Ratio: 1.4 (calc) (ref 1.0–2.5)
ALT: 25 U/L (ref 6–29)
AST: 21 U/L (ref 10–35)
Albumin: 4.3 g/dL (ref 3.6–5.1)
Alkaline phosphatase (APISO): 87 U/L (ref 37–153)
BUN/Creatinine Ratio: 15 (calc) (ref 6–22)
BUN: 17 mg/dL (ref 7–25)
CO2: 27 mmol/L (ref 20–32)
Calcium: 10.1 mg/dL (ref 8.6–10.4)
Chloride: 101 mmol/L (ref 98–110)
Creat: 1.1 mg/dL — ABNORMAL HIGH (ref 0.50–0.99)
GFR, Est African American: 61 mL/min/{1.73_m2} (ref >=60)
GFR, Est Non African American: 53 mL/min/{1.73_m2} — ABNORMAL LOW (ref >=60)
Globulin: 3.1 g/dL (calc) (ref 1.9–3.7)
Glucose, Bld: 137 mg/dL — ABNORMAL HIGH (ref 65–99)
Potassium: 3.6 mmol/L (ref 3.5–5.3)
Sodium: 141 mmol/L (ref 135–146)
Total Bilirubin: 0.8 mg/dL (ref 0.2–1.2)
Total Protein: 7.4 g/dL (ref 6.1–8.1)

## 2019-06-29 LAB — LIPID PANEL
Cholesterol: 158 mg/dL (ref <200)
HDL: 54 mg/dL (ref >=50)
LDL Cholesterol (Calc): 84 mg/dL (calc)
Non-HDL Cholesterol (Calc): 104 mg/dL (calc) (ref <130)
Total CHOL/HDL Ratio: 2.9 (calc) (ref <5.0)
Triglycerides: 106 mg/dL (ref <150)

## 2019-06-29 LAB — VITAMIN D 25 HYDROXY (VIT D DEFICIENCY, FRACTURES): Vit D, 25-Hydroxy: 37 ng/mL (ref 30–100)

## 2019-06-29 LAB — HEMOGLOBIN A1C
Hgb A1c MFr Bld: 8.1 % of total Hgb — ABNORMAL HIGH (ref <5.7)
Mean Plasma Glucose: 186 (calc)
eAG (mmol/L): 10.3 (calc)

## 2019-06-30 ENCOUNTER — Ambulatory Visit: Payer: Self-pay

## 2019-07-05 ENCOUNTER — Encounter: Payer: Self-pay | Admitting: Family Medicine

## 2019-07-05 ENCOUNTER — Encounter (INDEPENDENT_AMBULATORY_CARE_PROVIDER_SITE_OTHER): Payer: Self-pay

## 2019-07-05 ENCOUNTER — Other Ambulatory Visit: Payer: Self-pay

## 2019-07-05 ENCOUNTER — Ambulatory Visit (INDEPENDENT_AMBULATORY_CARE_PROVIDER_SITE_OTHER): Payer: Medicare Other | Admitting: Family Medicine

## 2019-07-05 VITALS — BP 130/86 | HR 74 | Temp 98.1°F | Resp 16 | Ht 62.0 in | Wt 248.0 lb

## 2019-07-05 DIAGNOSIS — E7849 Other hyperlipidemia: Secondary | ICD-10-CM | POA: Diagnosis not present

## 2019-07-05 DIAGNOSIS — I10 Essential (primary) hypertension: Secondary | ICD-10-CM | POA: Diagnosis not present

## 2019-07-05 DIAGNOSIS — E1159 Type 2 diabetes mellitus with other circulatory complications: Secondary | ICD-10-CM

## 2019-07-05 DIAGNOSIS — K219 Gastro-esophageal reflux disease without esophagitis: Secondary | ICD-10-CM

## 2019-07-05 MED ORDER — GLIPIZIDE ER 2.5 MG PO TB24: 2.5000 mg | ORAL_TABLET | Freq: Every day | ORAL | 3 refills | Status: DC

## 2019-07-05 NOTE — Patient Instructions (Addendum)
F/U with pap with MD 2nd week September, call if you need me sooner  Start glipizide 2.5 mg daily, continue metformin for blood sugar  CBC, microalb, TSH sept 1 or shortly after  Labs overall excellent  It is important that you exercise regularly at least 30 minutes 5 times a week. If you develop chest pain, have severe difficulty breathing, or feel very tired, stop exercising immediately and seek medical attention    Think about what you will eat, plan ahead. Choose " clean, green, fresh or frozen" over canned, processed or packaged foods which are more sugary, salty and fatty. 70 to 75% of food eaten should be vegetables and fruit. Three meals at set times with snacks allowed between meals, but they must be fruit or vegetables. Aim to eat over a 12 hour period , example 7 am to 7 pm, and STOP after  your last meal of the day. Drink water,generally about 64 ounces per day, no other drink is as healthy. Fruit juice is best enjoyed in a healthy way, by EATING the fruit.

## 2019-07-05 NOTE — Assessment & Plan Note (Signed)
[name] is reminded of the importance of commitment to daily physical activity for 30 minutes or more, as able and the need to limit carbohydrate intake to 30 to 60 grams per meal to help with blood sugar control.   The need to take medication as prescribed, test blood sugar as directed, and to call between visits if there is a concern that blood sugar is uncontrolled is also discussed.   [name] is reminded of the importance of daily foot exam, annual eye examination, and good blood sugar, blood pressure and cholesterol control.  Diabetic Labs Latest Ref Rng & Units 06/28/2019 03/21/2019 11/15/2018 07/23/2018 02/06/2018  HbA1c <5.7 % of total Hgb 8.1(H) 7.8(H) 7.2(H) 7.6(H) 7.0(H)  Microalbumin mg/dL - - - 0.5 -  Micro/Creat Ratio <30 mcg/mg creat - - - - -  Chol <200 mg/dL 158 281(H) - 166 161  HDL > OR = 50 mg/dL 54 59 - 65 57  Calc LDL mg/dL (calc) 84 194(H) - 82 87  Triglycerides <150 mg/dL 106 130 - 93 84  Creatinine 0.50 - 0.99 mg/dL 1.10(H) 1.04(H) 0.98 1.03(H) 1.03(H)   BP/Weight 07/05/2019 03/29/2019 12/09/2018 11/16/2018 07/28/2018 02/09/2018 6/37/8588  Systolic BP 502 774 128 786 767 209 470  Diastolic BP 86 88 90 80 86 82 84  Wt. (Lbs) 248 250 252.8 251.04 255 252 -  BMI 45.36 45.73 46.24 45.92 46.64 46.09 -   Foot/eye exam completion dates Latest Ref Rng & Units 08/05/2018 07/28/2018  Eye Exam No Retinopathy No Retinopathy -  Foot Form Completion - - Done

## 2019-07-05 NOTE — Assessment & Plan Note (Signed)
Hyperlipidemia:Low fat diet discussed and encouraged.   Lipid Panel  Lab Results  Component Value Date   CHOL 158 06/28/2019   HDL 54 06/28/2019   LDLCALC 84 06/28/2019   TRIG 106 06/28/2019   CHOLHDL 2.9 06/28/2019     Controlled, no change in medication

## 2019-07-05 NOTE — Assessment & Plan Note (Signed)
  Patient re-educated about  the importance of commitment to a  minimum of 150 minutes of exercise per week as able.  The importance of healthy food choices with portion control discussed, as well as eating regularly and within a 12 hour window most days. The need to choose "clean , green" food 50 to 75% of the time is discussed, as well as to make water the primary drink and set a goal of 64 ounces water daily.    Weight /BMI 07/05/2019 03/29/2019 12/09/2018  WEIGHT 248 lb 250 lb 252 lb 12.8 oz  HEIGHT 5\' 2"  5\' 2"  5\' 2"   BMI 45.36 kg/m2 45.73 kg/m2 46.24 kg/m2

## 2019-07-05 NOTE — Progress Notes (Signed)
Tina Wilkins     MRN: [id number]      DOB: June 26, 1956   HPI [name] is here for follow up and re-evaluation of chronic medical conditions, medication management and review of any available recent lab and radiology data.  Preventive health is updated, specifically  Cancer screening and Immunization.   Questions or concerns regarding consultations or procedures which the PT has had in the interim are  addressed. The PT denies any adverse reactions to current medications since the last visit.  Increased stress with death in the family, and ongoing challenge of caring indirectly for her Mom in law Denies polyuria, polydipsia, blurred vision , or hypoglycemic episodes. Expectselevated blood sugar as eating is not as controlled as she would like ROS Denies recent fever or chills. Denies sinus pressure, nasal congestion, ear pain or sore throat. Denies chest congestion, productive cough or wheezing. Denies chest pains, palpitations and leg swelling Denies abdominal pain, nausea, vomiting,diarrhea or constipation.   Denies dysuria, frequency, hesitancy or incontinence. Denies joint pain, swelling and limitation in mobility. Denies headaches, seizures, numbness, or tingling. Denies depression, anxiety or insomnia. Denies skin break down or rash.   PE  BP 130/86   Pulse 74   Temp 98.1 F (36.7 C) (Temporal)   Resp 16   Ht 5\' 2"  (1.575 m)   Wt 248 lb (112.5 kg)   SpO2 98%   BMI 45.36 kg/m   Patient alert and oriented and in no cardiopulmonary distress.  HEENT: No facial asymmetry, EOMI,   oropharynx pink and moist.  Neck supple no JVD, no mass.  Chest: Clear to auscultation bilaterally.  CVS: S1, S2 no murmurs, no S3.Regular rate.  ABD: Soft non tender.   Ext: No edema  MS: Adequate ROM spine, shoulders, hips and knees.  Skin: Intact, no ulcerations or rash noted.  Psych: Good eye contact, normal affect. Memory intact not anxious or depressed appearing.  CNS: CN  2-12 intact, power,  normal throughout.no focal deficits noted.   Assessment & Plan  Morbid obesity  Patient re-educated about  the importance of commitment to a  minimum of 150 minutes of exercise per week as able.  The importance of healthy food choices with portion control discussed, as well as eating regularly and within a 12 hour window most days. The need to choose "clean , green" food 50 to 75% of the time is discussed, as well as to make water the primary drink and set a goal of 64 ounces water daily.    Weight /BMI 07/05/2019 03/29/2019 12/09/2018  WEIGHT 248 lb 250 lb 252 lb 12.8 oz  HEIGHT 5\' 2"  5\' 2"  5\' 2"   BMI 45.36 kg/m2 45.73 kg/m2 46.24 kg/m2      Hypertension goal BP (blood pressure) < 130/80 Controlled, no change in medication DASH diet and commitment to daily physical activity for a minimum of 30 minutes discussed and encouraged, as a part of hypertension management. The importance of attaining a healthy weight is also discussed.  BP/Weight 07/05/2019 03/29/2019 12/09/2018 11/16/2018 07/28/2018 02/09/2018 12/09/3233  Systolic BP 573 220 254 270 623 762 831  Diastolic BP 86 88 90 80 86 82 84  Wt. (Lbs) 248 250 252.8 251.04 255 252 -  BMI 45.36 45.73 46.24 45.92 46.64 46.09 -       Type 2 diabetes mellitus with vascular disease ([place]) [name] is reminded of the importance of commitment to daily physical activity for 30 minutes or more, as able and the  need to limit carbohydrate intake to 30 to 60 grams per meal to help with blood sugar control.   The need to take medication as prescribed, test blood sugar as directed, and to call between visits if there is a concern that blood sugar is uncontrolled is also discussed.   [name] is reminded of the importance of daily foot exam, annual eye examination, and good blood sugar, blood pressure and cholesterol control.  Diabetic Labs Latest Ref Rng & Units 06/28/2019 03/21/2019 11/15/2018 07/23/2018 02/06/2018  HbA1c <5.7 % of  total Hgb 8.1(H) 7.8(H) 7.2(H) 7.6(H) 7.0(H)  Microalbumin mg/dL - - - 0.5 -  Micro/Creat Ratio <30 mcg/mg creat - - - - -  Chol <200 mg/dL 161158 096(E281(H) - 454166 098161  HDL > OR = 50 mg/dL 54 59 - 65 57  Calc LDL mg/dL (calc) 84 119(J194(H) - 82 87  Triglycerides <150 mg/dL 478106 295130 - 93 84  Creatinine 0.50 - 0.99 mg/dL 6.21(H1.10(H) 0.86(V1.04(H) 7.840.98 6.96(E1.03(H) 1.03(H)   BP/Weight 07/05/2019 03/29/2019 12/09/2018 11/16/2018 07/28/2018 02/09/2018 07/15/2017  Systolic BP 130 130 170 128 138 140 128  Diastolic BP 86 88 90 80 86 82 84  Wt. (Lbs) 248 250 252.8 251.04 255 252 -  BMI 45.36 45.73 46.24 45.92 46.64 46.09 -   Foot/eye exam completion dates Latest Ref Rng & Units 08/05/2018 07/28/2018  Eye Exam No Retinopathy No Retinopathy -  Foot Form Completion - - Done        Hyperlipidemia Hyperlipidemia:Low fat diet discussed and encouraged.   Lipid Panel  Lab Results  Component Value Date   CHOL 158 06/28/2019   HDL 54 06/28/2019   LDLCALC 84 06/28/2019   TRIG 106 06/28/2019   CHOLHDL 2.9 06/28/2019     Controlled, no change in medication   Chronic GERD Controlled, no change in medication

## 2019-07-05 NOTE — Assessment & Plan Note (Signed)
Controlled, no change in medication  

## 2019-07-05 NOTE — Assessment & Plan Note (Signed)
Controlled, no change in medication DASH diet and commitment to daily physical activity for a minimum of 30 minutes discussed and encouraged, as a part of hypertension management. The importance of attaining a healthy weight is also discussed.  BP/Weight 07/05/2019 03/29/2019 12/09/2018 11/16/2018 07/28/2018 02/09/2018 8/46/9629  Systolic BP 528 413 244 010 272 536 644  Diastolic BP 86 88 90 80 86 82 84  Wt. (Lbs) 248 250 252.8 251.04 255 252 -  BMI 45.36 45.73 46.24 45.92 46.64 46.09 -

## 2019-07-25 MED FILL — TRIAMTERENE-HCTZ 75-50 MG T: 75-50 | 30 days supply | Qty: 30 | Fill #5

## 2019-07-25 MED FILL — METOPROLOL SUCCINATE ER 25: 25 | 30 days supply | Qty: 30 | Fill #6

## 2019-07-25 MED FILL — ROSUVASTATIN CALCIUM 40 MG: 40 | 30 days supply | Qty: 30 | Fill #4

## 2019-07-25 MED FILL — VIT D2 1.25 MG (50,000 UNIT: 1.25 MG | 28 days supply | Qty: 4 | Fill #1

## 2019-07-25 MED FILL — cloNIDine HCL 0.3 MG TABS: 0.3 | 30 days supply | Qty: 30 | Fill #3

## 2019-08-03 MED FILL — LUMIGAN 0.01% EYE DROPS: 0.01 | 25 days supply | Qty: 3 | Fill #0

## 2019-08-03 MED FILL — RESTASIS 0.05% EYE EMULSION: 0.05 | 30 days supply | Qty: 60 | Fill #0

## 2019-08-06 LAB — HM DIABETES EYE EXAM

## 2019-08-24 ENCOUNTER — Other Ambulatory Visit: Payer: Self-pay | Admitting: Cardiology

## 2019-08-24 MED FILL — cloNIDine HCL 0.3 MG TABS: 0.3 | 30 days supply | Qty: 30 | Fill #4

## 2019-08-24 MED FILL — TRIAMTERENE-HCTZ 75-50 MG T: 75-50 | 30 days supply | Qty: 30 | Fill #6

## 2019-08-24 MED FILL — ROSUVASTATIN CALCIUM 40 MG: 40 | 30 days supply | Qty: 30 | Fill #5

## 2019-08-24 MED FILL — VIT D2 1.25 MG (50,000 UNIT: 1.25 MG | 28 days supply | Qty: 4 | Fill #2

## 2019-08-25 ENCOUNTER — Ambulatory Visit: Payer: Medicare Other | Admitting: Family Medicine

## 2019-08-25 MED FILL — METOPROLOL SUCCINATE ER 25: 25 | 30 days supply | Qty: 30 | Fill #0

## 2019-08-26 ENCOUNTER — Other Ambulatory Visit: Payer: Self-pay | Admitting: Family Medicine

## 2019-08-29 MED FILL — ACCU-CHEK FASTCLIX LANCETS: 90 days supply | Qty: 102 | Fill #0

## 2019-08-29 MED FILL — ACCU-CHEK GUIDE TEST STRIP: 50 days supply | Qty: 50 | Fill #0

## 2019-08-30 ENCOUNTER — Ambulatory Visit: Payer: 59 | Admitting: Family Medicine

## 2019-08-31 ENCOUNTER — Other Ambulatory Visit (HOSPITAL_COMMUNITY)
Admission: RE | Admit: 2019-08-31 | Discharge: 2019-08-31 | Disposition: A | Discharge status: Home / Self Care | Payer: Medicare Other | Source: Ambulatory Visit | Attending: Family Medicine | Admitting: Family Medicine

## 2019-08-31 ENCOUNTER — Encounter: Payer: Self-pay | Admitting: Family Medicine

## 2019-08-31 ENCOUNTER — Other Ambulatory Visit: Payer: Self-pay

## 2019-08-31 ENCOUNTER — Encounter (INDEPENDENT_AMBULATORY_CARE_PROVIDER_SITE_OTHER): Payer: Self-pay

## 2019-08-31 ENCOUNTER — Ambulatory Visit (INDEPENDENT_AMBULATORY_CARE_PROVIDER_SITE_OTHER): Payer: Medicare Other | Admitting: Family Medicine

## 2019-08-31 VITALS — BP 134/84 | HR 75 | Temp 98.4°F | Resp 15 | Ht 62.0 in | Wt 247.0 lb

## 2019-08-31 DIAGNOSIS — Z23 Encounter for immunization: Secondary | ICD-10-CM

## 2019-08-31 DIAGNOSIS — Z0000 Encounter for general adult medical examination without abnormal findings: Secondary | ICD-10-CM

## 2019-08-31 DIAGNOSIS — R8290 Unspecified abnormal findings in urine: Secondary | ICD-10-CM | POA: Insufficient documentation

## 2019-08-31 LAB — POCT URINALYSIS DIP (CLINITEK)
Glucose, UA: NEGATIVE mg/dL
Nitrite, UA: NEGATIVE
POC PROTEIN,UA: 100 — AB
Spec Grav, UA: 1.02 (ref 1.010–1.025)
Urobilinogen, UA: 1 E.U./dL
pH, UA: 5.5 (ref 5.0–8.0)

## 2019-08-31 NOTE — Patient Instructions (Addendum)
Tina Wilkins , Thank you for taking time to come for your Medicare Wellness Visit. I appreciate your ongoing commitment to your health goals. Please review the following plan we discussed and let me know if I can assist you in the future.   Please continue to practice social distancing to keep you, your family, and our community safe.  If you must go out, please wear a Mask and practice good handwashing.  Screening recommendations/referrals: Colonoscopy: Due 2025 Mammogram: Up to date Bone Density: Up to date Recommended yearly ophthalmology/optometry visit for glaucoma screening and checkup Recommended yearly dental visit for hygiene and checkup  Vaccinations: Influenza vaccine: completed today Pneumococcal vaccine: Completed Tdap vaccine: Up to date Shingles vaccine: Completed  Advanced directives: Husband knows wishes.  Conditions/risks identified: Falls   Next appointment: 09/12/2019   Preventive Care 65 Years and Older, Female Preventive care refers to lifestyle choices and visits with your health care provider that can promote health and wellness. What does preventive care include?  A yearly physical exam. This is also called an annual well check.  Dental exams once or twice a year.  Routine eye exams. Ask your health care provider how often you should have your eyes checked.  Personal lifestyle choices, including:  Daily care of your teeth and gums.  Regular physical activity.  Eating a healthy diet.  Avoiding tobacco and drug use.  Limiting alcohol use.  Practicing safe sex.  Taking low-dose aspirin every day.  Taking vitamin and mineral supplements as recommended by your health care provider. What happens during an annual well check? The services and screenings done by your health care provider during your annual well check will depend on your age, overall health, lifestyle risk factors, and family history of disease. Counseling  Your health care  provider may ask you questions about your:  Alcohol use.  Tobacco use.  Drug use.  Emotional well-being.  Home and relationship well-being.  Sexual activity.  Eating habits.  History of falls.  Memory and ability to understand (cognition).  Work and work Statistician.  Reproductive health. Screening  You may have the following tests or measurements:  Height, weight, and BMI.  Blood pressure.  Lipid and cholesterol levels. These may be checked every 5 years, or more frequently if you are over 39 years old.  Skin check.  Lung cancer screening. You may have this screening every year starting at age 70 if you have a 30-pack-year history of smoking and currently smoke or have quit within the past 15 years.  Fecal occult blood test (FOBT) of the stool. You may have this test every year starting at age 3.  Flexible sigmoidoscopy or colonoscopy. You may have a sigmoidoscopy every 5 years or a colonoscopy every 10 years starting at age 12.  Hepatitis C blood test.  Hepatitis B blood test.  Sexually transmitted disease (STD) testing.  Diabetes screening. This is done by checking your blood sugar (glucose) after you have not eaten for a while (fasting). You may have this done every 1-3 years.  Bone density scan. This is done to screen for osteoporosis. You may have this done starting at age 80.  Mammogram. This may be done every 1-2 years. Talk to your health care provider about how often you should have regular mammograms. Talk with your health care provider about your test results, treatment options, and if necessary, the need for more tests. Vaccines  Your health care provider may recommend certain vaccines, such as:  Influenza vaccine. This  is recommended every year.  Tetanus, diphtheria, and acellular pertussis (Tdap, Td) vaccine. You may need a Td booster every 10 years.  Zoster vaccine. You may need this after age 10.  Pneumococcal 13-valent conjugate (PCV13)  vaccine. One dose is recommended after age 54.  Pneumococcal polysaccharide (PPSV23) vaccine. One dose is recommended after age 44. Talk to your health care provider about which screenings and vaccines you need and how often you need them. This information is not intended to replace advice given to you by your health care provider. Make sure you discuss any questions you have with your health care provider. Document Released: 12/14/2015 Document Revised: 08/06/2016 Document Reviewed: 09/18/2015 Elsevier Interactive Patient Education  2017 [place] Prevention in the Home Falls can cause injuries. They can happen to people of all ages. There are many things you can do to make your home safe and to help prevent falls. What can I do on the outside of my home?  Regularly fix the edges of walkways and driveways and fix any cracks.  Remove anything that might make you trip as you walk through a door, such as a raised step or threshold.  Trim any bushes or trees on the path to your home.  Use bright outdoor lighting.  Clear any walking paths of anything that might make someone trip, such as rocks or tools.  Regularly check to see if handrails are loose or broken. Make sure that both sides of any steps have handrails.  Any raised decks and porches should have guardrails on the edges.  Have any leaves, snow, or ice cleared regularly.  Use sand or salt on walking paths during winter.  Clean up any spills in your garage right away. This includes oil or grease spills. What can I do in the bathroom?  Use night lights.  Install grab bars by the toilet and in the tub and shower. Do not use towel bars as grab bars.  Use non-skid mats or decals in the tub or shower.  If you need to sit down in the shower, use a plastic, non-slip stool.  Keep the floor dry. Clean up any water that spills on the floor as soon as it happens.  Remove soap buildup in the tub or shower regularly.   Attach bath mats securely with double-sided non-slip rug tape.  Do not have throw rugs and other things on the floor that can make you trip. What can I do in the bedroom?  Use night lights.  Make sure that you have a light by your bed that is easy to reach.  Do not use any sheets or blankets that are too big for your bed. They should not hang down onto the floor.  Have a firm chair that has side arms. You can use this for support while you get dressed.  Do not have throw rugs and other things on the floor that can make you trip. What can I do in the kitchen?  Clean up any spills right away.  Avoid walking on wet floors.  Keep items that you use a lot in easy-to-reach places.  If you need to reach something above you, use a strong step stool that has a grab bar.  Keep electrical cords out of the way.  Do not use floor polish or wax that makes floors slippery. If you must use wax, use non-skid floor wax.  Do not have throw rugs and other things on the floor that can make you  trip. What can I do with my stairs?  Do not leave any items on the stairs.  Make sure that there are handrails on both sides of the stairs and use them. Fix handrails that are broken or loose. Make sure that handrails are as long as the stairways.  Check any carpeting to make sure that it is firmly attached to the stairs. Fix any carpet that is loose or worn.  Avoid having throw rugs at the top or bottom of the stairs. If you do have throw rugs, attach them to the floor with carpet tape.  Make sure that you have a light switch at the top of the stairs and the bottom of the stairs. If you do not have them, ask someone to add them for you. What else can I do to help prevent falls?  Wear shoes that:  Do not have high heels.  Have rubber bottoms.  Are comfortable and fit you well.  Are closed at the toe. Do not wear sandals.  If you use a stepladder:  Make sure that it is fully opened. Do not climb  a closed stepladder.  Make sure that both sides of the stepladder are locked into place.  Ask someone to hold it for you, if possible.  Clearly mark and make sure that you can see:  Any grab bars or handrails.  First and last steps.  Where the edge of each step is.  Use tools that help you move around (mobility aids) if they are needed. These include:  Canes.  Walkers.  Scooters.  Crutches.  Turn on the lights when you go into a dark area. Replace any light bulbs as soon as they burn out.  Set up your furniture so you have a clear path. Avoid moving your furniture around.  If any of your floors are uneven, fix them.  If there are any pets around you, be aware of where they are.  Review your medicines with your doctor. Some medicines can make you feel dizzy. This can increase your chance of falling. Ask your doctor what other things that you can do to help prevent falls. This information is not intended to replace advice given to you by your health care provider. Make sure you discuss any questions you have with your health care provider. Document Released: 09/13/2009 Document Revised: 04/24/2016 Document Reviewed: 12/22/2014 Elsevier Interactive Patient Education  2017 [employer].

## 2019-08-31 NOTE — Progress Notes (Signed)
Subjective:    Tina Wilkins is a 63 y.o. female who presents for a Welcome to Medicare exam.   Review of Systems  Cardiac Risk Factors include: advanced age (>34mn, >>99women);diabetes mellitus;hypertension;dyslipidemia      Objective:    Today's Vitals   08/31/19 1307 08/31/19 1321  BP: 134/84   Pulse: 75   Resp: 15   Temp: 98.4 F (36.9 C)   TempSrc: Temporal   SpO2: 96%   Weight: 247 lb (112 kg)   Height: _0  (1.575 m)   PainSc: 0-No pain 0-No pain  Body mass index is 45.18 kg/m.  Medications Outpatient Encounter Medications as of 08/31/2019  Medication Sig  . Accu-Chek FastClix Lancets MISC USE AS DIRECTED TO CHECK BLOOD SUGAR DAILY  . ACCU-CHEK GUIDE test strip USE AS DIRECTED TO CHECK BLOOD SUGAR DAILY  . acetaminophen (TYLENOL) 500 MG tablet Take 500 mg by mouth every 6 (six) hours as needed.  . Alcohol Swabs (ALCOHOL PREPS) PADS Use as directed when testing blood glucose  . benazepril (LOTENSIN) 40 MG tablet Take 1 tablet (40 mg total) by mouth daily.  . blood glucose meter kit and supplies Accuchek meter (per patient request) once daily testing  DX e11.9  . calcium-vitamin D (OSCAL WITH D) 500-200 MG-UNIT per tablet Take 1 tablet by mouth 2 (two) times daily.  . cloNIDine (CATAPRES) 0.3 MG tablet Take 1 tablet (0.3 mg total) by mouth at bedtime.  .Marland KitchenglipiZIDE (GLUCOTROL XL) 2.5 MG 24 hr tablet Take 1 tablet (2.5 mg total) by mouth daily with breakfast.  . LUMIGAN 0.01 % SOLN Place 1 drop into both eyes daily.  . metFORMIN (GLUCOPHAGE) 1000 MG tablet TAKE 1 TABLET BY MOUTH 2 TIMES DAILY WITH A MEAL.  . metoprolol succinate (TOPROL-XL) 25 MG 24 hr tablet TAKE 1 TABLET BY MOUTH ONCE DAILY  . montelukast (SINGULAIR) 10 MG tablet Take 1 tablet (10 mg total) by mouth at bedtime.  . pantoprazole (PROTONIX) 40 MG tablet Take 1 tablet (40 mg total) by mouth daily.  . rosuvastatin (CRESTOR) 40 MG tablet Take 1 tablet (40 mg total) by mouth daily.  .Marland Kitchen triamterene-hydrochlorothiazide (MAXZIDE) 75-50 MG tablet Take 1 tablet by mouth daily.  . Vitamin D, Ergocalciferol, (DRISDOL) 1.25 MG (50000 UT) CAPS capsule TAKE 1 CAPSULE (50,000 UNITS) BY MOUTH ONCE A WEEK.   No facility-administered encounter medications on file as of 08/31/2019.      History: Past Medical History:  Diagnosis Date  . Essential hypertension 2005  . Hyperlipidemia 2005  . Obesity   . Seasonal allergies   . Type 2 diabetes mellitus ([place] 2010   Past Surgical History:  Procedure Laterality Date  . ABDOMINAL HYSTERECTOMY  2004   Secondary to fibroids, single ovary left  . [place]  2 vaginal deliveries before  . SHOULDER SURGERY Left     Family History  Problem Relation Age of Onset  . Heart disease Mother   . Stroke Mother   . Heart disease Father   . Stroke Father   . Diabetes Father   . Diabetes Sister   . Heart disease Sister   . Lymphoma Sister   . Cancer Brother   . Heart disease Brother   . Cancer Brother        unknown type  . Lung disease Brother   . Heart disease Brother   . Liver cancer Brother   . Lung cancer Brother   . Liver  cancer Brother   . Lung cancer Brother   . Seizures Son    Social History   Occupational History  . Not on file  Tobacco Use  . Smoking status: Never Smoker  . Smokeless tobacco: Never Used  Substance and Sexual Activity  . Alcohol use: No    Alcohol/week: 0.0 standard drinks  . Drug use: No  . Sexual activity: Yes    Birth control/protection: Surgical    Tobacco Counseling Counseling given: Yes   Immunizations and Health Maintenance Immunization History  Administered Date(s) Administered  . Influenza Split 09/11/2014  . Influenza Whole 10/06/2006, 08/22/2009  . Influenza,inj,Quad PF,6+ Mos 07/28/2018  . Pneumococcal Conjugate-13 02/14/2015  . Pneumococcal Polysaccharide-23 09/22/2013  . Tdap 01/26/2013  . Zoster 10/18/2014  . Zoster Recombinat (Shingrix) 04/06/2019, 06/28/2019    Health Maintenance Due  Topic Date Due  . PAP SMEAR-Modifier  05/27/2019  . INFLUENZA VACCINE  07/02/2019  . PNA vac Low Risk Adult (2 of 2 - PPSV23) 07/10/2019  . FOOT EXAM  07/29/2019  . OPHTHALMOLOGY EXAM  08/06/2019    Activities of Daily Living In your present state of health, do you have any difficulty performing the following activities: 08/31/2019  Hearing? N  Vision? N  Difficulty concentrating or making decisions? N  Walking or climbing stairs? N  Dressing or bathing? N  Doing errands, shopping? N  Preparing Food and eating ? N  Using the Toilet? N  In the past six months, have you accidently leaked urine? N  Do you have problems with loss of bowel control? N  Managing your Medications? N  Managing your Finances? N  Housekeeping or managing your Housekeeping? N  Some recent data might be hidden    Physical Exam  (optional), or other factors deemed appropriate based on the beneficiary's medical and social history and current clinical standards.  Advanced Directives: Does not have  Does Patient Have a Medical Advance Directive?: No    Assessment:      Vision/Hearing screen No exam data present  Dietary issues and exercise activities discussed:  Current Exercise Habits: Home exercise routine, Type of exercise: walking, Time (Minutes): 40, Frequency (Times/Week): 4, Weekly Exercise (Minutes/Week): 160, Intensity: Moderate, Exercise limited by: None identified  Goals    . Exercise 3x per week (30 min per time)     Obese, recommend increasing your exercise program to 30-45 minutes at least 3 days a week as tolerated.        Depression Screen PHQ 2/9 Scores 08/31/2019 08/31/2019 07/05/2019 11/16/2018  PHQ - 2 Score 0 0 0 0  PHQ- 9 Score - - - -     Fall Risk Fall Risk  08/31/2019  Falls in the past year? 0  Number falls in past yr: 0  Injury with Fall? 0    Cognitive Function:     6CIT Screen 08/31/2019 04/01/2017  What Year? 0 points 0 points  What  month? 0 points 0 points  What time? 0 points 0 points  Count back from 20 0 points 0 points  Months in reverse 0 points 0 points  Repeat phrase 0 points 0 points  Total Score 0 0    Patient Care Team: Fayrene Helper, MD as PCP - General Carol Ada, MD as Consulting Physician (Gastroenterology) Marylynn Pearson, MD as Consulting Physician (Ophthalmology) Carole Civil, MD as Consulting Physician (Orthopedic Surgery)     Plan:      1. Encounter for Medicare annual wellness exam  -  POCT URINALYSIS DIP (CLINITEK)  I have personally reviewed and noted the following in the patient's chart:   . Medical and social history . Use of alcohol, tobacco or illicit drugs  . Current medications and supplements . Functional ability and status . Nutritional status . Physical activity . Advanced directives . List of other physicians . Hospitalizations, surgeries, and ER visits in previous 12 months . Vitals . Screenings to include cognitive, depression, and falls . Referrals and appointments  In addition, I have reviewed and discussed with patient certain preventive protocols, quality metrics, and best practice recommendations. A written personalized care plan for preventive services as well as general preventive health recommendations were provided to patient.       2. Abnormal finding on urinalysis + for WBC trace, send out   -Urine Culture  3. Need for immunization against influenza Patient was educated on the recommendation for flu vaccine. After obtaining informed consent, the vaccine was administered no adverse effects noted at time of administration. Patient provided with education on arm soreness and use of tylenol or ibuprofen (if safe) for this. Encourage to use the arm vaccine was given in to help reduce the soreness. Patient educated on the signs of a reaction to the vaccine and advised to contact the office should these occur.   - Flu Vaccine QUAD High  Dose(Fluad)     Perlie Mayo, NP 08/31/2019

## 2019-09-02 ENCOUNTER — Other Ambulatory Visit: Payer: Self-pay | Admitting: Family Medicine

## 2019-09-02 DIAGNOSIS — N3001 Acute cystitis with hematuria: Secondary | ICD-10-CM

## 2019-09-02 LAB — URINE CULTURE: Culture: 90000 — AB

## 2019-09-02 MED ORDER — NITROFURANTOIN MONOHYD MACRO 100 MG PO CAPS: 100.0000 mg | ORAL_CAPSULE | Freq: Two times a day (BID) | ORAL | 0 refills | Status: AC

## 2019-09-02 NOTE — Progress Notes (Signed)
I notified the pt personally. Medication was sent to pharmacy of choice.

## 2019-09-10 LAB — MICROALBUMIN / CREATININE URINE RATIO
Creatinine, Urine: 140 mg/dL (ref 20–275)
Microalb Creat Ratio: 50 mcg/mg creat — ABNORMAL HIGH (ref <30)
Microalb, Ur: 7 mg/dL

## 2019-09-10 LAB — CBC
HCT: 36.8 % (ref 35.0–45.0)
Hemoglobin: 11.9 g/dL (ref 11.7–15.5)
MCH: 27.4 pg (ref 27.0–33.0)
MCHC: 32.3 g/dL (ref 32.0–36.0)
MCV: 84.8 fL (ref 80.0–100.0)
MPV: 11 fL (ref 7.5–12.5)
Platelets: 416 10*3/uL — ABNORMAL HIGH (ref 140–400)
RBC: 4.34 10*6/uL (ref 3.80–5.10)
RDW: 14.1 % (ref 11.0–15.0)
WBC: 7.7 10*3/uL (ref 3.8–10.8)

## 2019-09-10 LAB — TSH: TSH: 2 mIU/L (ref 0.40–4.50)

## 2019-09-12 ENCOUNTER — Ambulatory Visit (INDEPENDENT_AMBULATORY_CARE_PROVIDER_SITE_OTHER): Payer: Medicare Other | Admitting: Family Medicine

## 2019-09-12 ENCOUNTER — Encounter: Payer: Self-pay | Admitting: Family Medicine

## 2019-09-12 ENCOUNTER — Encounter (INDEPENDENT_AMBULATORY_CARE_PROVIDER_SITE_OTHER): Payer: Self-pay

## 2019-09-12 ENCOUNTER — Other Ambulatory Visit: Payer: Self-pay

## 2019-09-12 ENCOUNTER — Other Ambulatory Visit (HOSPITAL_COMMUNITY)
Admission: RE | Admit: 2019-09-12 | Discharge: 2019-09-12 | Disposition: A | Discharge status: Home / Self Care | Payer: Medicare Other | Source: Ambulatory Visit | Attending: Family Medicine | Admitting: Family Medicine

## 2019-09-12 VITALS — BP 122/76 | HR 82 | Resp 15 | Ht 63.0 in | Wt 247.0 lb

## 2019-09-12 DIAGNOSIS — E1159 Type 2 diabetes mellitus with other circulatory complications: Secondary | ICD-10-CM | POA: Diagnosis not present

## 2019-09-12 DIAGNOSIS — E8940 Asymptomatic postprocedural ovarian failure: Secondary | ICD-10-CM

## 2019-09-12 DIAGNOSIS — Z90710 Acquired absence of both cervix and uterus: Secondary | ICD-10-CM | POA: Diagnosis not present

## 2019-09-12 DIAGNOSIS — Z1151 Encounter for screening for human papillomavirus (HPV): Secondary | ICD-10-CM | POA: Diagnosis not present

## 2019-09-12 DIAGNOSIS — Z124 Encounter for screening for malignant neoplasm of cervix: Secondary | ICD-10-CM | POA: Insufficient documentation

## 2019-09-12 DIAGNOSIS — Z23 Encounter for immunization: Secondary | ICD-10-CM | POA: Diagnosis not present

## 2019-09-12 DIAGNOSIS — Z1231 Encounter for screening mammogram for malignant neoplasm of breast: Secondary | ICD-10-CM | POA: Diagnosis not present

## 2019-09-12 DIAGNOSIS — I10 Essential (primary) hypertension: Secondary | ICD-10-CM | POA: Diagnosis not present

## 2019-09-12 DIAGNOSIS — R350 Frequency of micturition: Secondary | ICD-10-CM

## 2019-09-12 DIAGNOSIS — E7849 Other hyperlipidemia: Secondary | ICD-10-CM

## 2019-09-12 LAB — POCT URINALYSIS DIP (CLINITEK)
Bilirubin, UA: NEGATIVE
Blood, UA: NEGATIVE
Glucose, UA: NEGATIVE mg/dL
Ketones, POC UA: NEGATIVE mg/dL
Leukocytes, UA: NEGATIVE
Nitrite, UA: NEGATIVE
POC PROTEIN,UA: 30 — AB
Spec Grav, UA: 1.015 (ref 1.010–1.025)
Urobilinogen, UA: 0.2 E.U./dL
pH, UA: 7.5 (ref 5.0–8.0)

## 2019-09-12 NOTE — Patient Instructions (Addendum)
F/U in office with MD early March, call if you need me before  Please schedule Jan mammogram for pt   Please schedule dexa which is due  Pneumonia 23 today   Non fasting HBA1C, chem7 and EGFR October 30 , or shortly after  Fasting lipid, cmp and EGFr and HBA1C first week in March  It is important that you exercise regularly at least 30 minutes 5 times a week. If you develop chest pain, have severe difficulty breathing, or feel very tired, stop exercising immediately and seek medical attention    Thanks for choosing [facility], we consider it a privelige to serve you.

## 2019-09-12 NOTE — Assessment & Plan Note (Signed)
Pelvic exam as documented Pap sent

## 2019-09-12 NOTE — Assessment & Plan Note (Signed)
Controlled, no change in medication DASH diet and commitment to daily physical activity for a minimum of 30 minutes discussed and encouraged, as a part of hypertension management. The importance of attaining a healthy weight is also discussed.  BP/Weight 09/12/2019 08/31/2019 07/05/2019 03/29/2019 12/09/2018 11/16/2018 03/13/6437  Systolic BP 377 939 688 648 472 072 182  Diastolic BP 76 84 86 88 90 80 86  Wt. (Lbs) 247.04 247 248 250 252.8 251.04 255  BMI 43.76 45.18 45.36 45.73 46.24 45.92 46.64

## 2019-09-12 NOTE — Progress Notes (Signed)
Tina Wilkins     MRN: [id number]      DOB: 06-24-1956   HPI [name] is here for follow up and re-evaluation of chronic medical conditions, medication management and review of any available recent lab and radiology data.  Preventive health is updated, specifically  Cancer screening and Immunization.      ROS Denies recent fever or chills. Denies sinus pressure, nasal congestion, ear pain or sore throat. Denies chest congestion, productive cough or wheezing. Denies chest pains, palpitations and leg swelling Denies abdominal pain, nausea, vomiting,diarrhea or constipation.   Denies dysuria, frequency, hesitancy or incontinence. Denies joint pain, swelling and limitation in mobility. Denies headaches, seizures, numbness, or tingling. Denies depression, anxiety or insomnia. Denies skin break down or rash.   PE  BP 122/76   Pulse 82   Resp 15   Ht 5\' 3"  (1.6 m)   Wt 247 lb 0.6 oz (112.1 kg)   SpO2 98%   BMI 43.76 kg/m   Patient alert and oriented and in no cardiopulmonary distress.  HEENT: No facial asymmetry, EOMI,     Neck supple .  Chest: Clear to auscultation bilaterally.  CVS: S1, S2 no murmurs, no S3.Regular rate.  ABD: Soft non tender.  Pelvic: normal external female genitalia, no ulcers, physiologic d/c present Vaginal walls moist , no ulcers Cervix and uterus absent. No adnexal masses palpable and no adnexal  tenderness Ext: No edema  MS: Adequate ROM spine, shoulders, hips and knees.  Skin: Intact, no ulcerations or rash noted.  Psych: Good eye contact, normal affect. Memory intact not anxious or depressed appearing.  CNS: CN 2-12 intact, power,  normal throughout.no focal deficits noted.   Assessment & Plan  Hypertension goal BP (blood pressure) < 130/80 Controlled, no change in medication DASH diet and commitment to daily physical activity for a minimum of 30 minutes discussed and encouraged, as a part of hypertension management. The  importance of attaining a healthy weight is also discussed.  BP/Weight 09/12/2019 08/31/2019 07/05/2019 03/29/2019 12/09/2018 11/16/2018 07/28/2018  Systolic BP 122 134 130 130 170 128 138  Diastolic BP 76 84 86 88 90 80 86  Wt. (Lbs) 247.04 247 248 250 252.8 251.04 255  BMI 43.76 45.18 45.36 45.73 46.24 45.92 46.64       Morbid obesity  Patient re-educated about  the importance of commitment to a  minimum of 150 minutes of exercise per week as able.  The importance of healthy food choices with portion control discussed, as well as eating regularly and within a 12 hour window most days. The need to choose "clean , green" food 50 to 75% of the time is discussed, as well as to make water the primary drink and set a goal of 64 ounces water daily.    Weight /BMI 09/12/2019 08/31/2019 07/05/2019  WEIGHT 247 lb 0.6 oz 247 lb 248 lb  HEIGHT 5\' 3"  5\' 2"  5\' 2"   BMI 43.76 kg/m2 45.18 kg/m2 45.36 kg/m2      Type 2 diabetes mellitus with vascular disease (HCC) [name] is reminded of the importance of commitment to daily physical activity for 30 minutes or more, as able and the need to limit carbohydrate intake to 30 to 60 grams per meal to help with blood sugar control.   The need to take medication as prescribed, test blood sugar as directed, and to call between visits if there is a concern that blood sugar is uncontrolled is also discussed.   Ms.  Tina Wilkins is reminded of the importance of daily foot exam, annual eye examination, and good blood sugar, blood pressure and cholesterol control. Updated lab needed at/ before next visit.   Diabetic Labs Latest Ref Rng & Units 09/09/2019 06/28/2019 03/21/2019 11/15/2018 07/23/2018  HbA1c <5.7 % of total Hgb - 8.1(H) 7.8(H) 7.2(H) 7.6(H)  Microalbumin mg/dL 7.0 - - - 0.5  Micro/Creat Ratio <30 mcg/mg creat 50(H) - - - -  Chol <200 mg/dL - 158 281(H) - 166  HDL > OR = 50 mg/dL - 54 59 - 65  Calc LDL mg/dL (calc) - 84 194(H) - 82  Triglycerides <150 mg/dL -  106 130 - 93  Creatinine 0.50 - 0.99 mg/dL - 1.10(H) 1.04(H) 0.98 1.03(H)   BP/Weight 09/12/2019 08/31/2019 07/05/2019 03/29/2019 12/09/2018 11/16/2018 1/76/1607  Systolic BP 371 062 694 854 627 035 009  Diastolic BP 76 84 86 88 90 80 86  Wt. (Lbs) 247.04 247 248 250 252.8 251.04 255  BMI 43.76 45.18 45.36 45.73 46.24 45.92 46.64   Foot/eye exam completion dates Latest Ref Rng & Units 09/12/2019 08/06/2019  Eye Exam No Retinopathy - No Retinopathy  Foot Form Completion - Done -        Encounter for screening for cervical cancer  Pelvic exam as documented Pap sent

## 2019-09-12 NOTE — Assessment & Plan Note (Signed)
  Patient re-educated about  the importance of commitment to a  minimum of 150 minutes of exercise per week as able.  The importance of healthy food choices with portion control discussed, as well as eating regularly and within a 12 hour window most days. The need to choose "clean , green" food 50 to 75% of the time is discussed, as well as to make water the primary drink and set a goal of 64 ounces water daily.    Weight /BMI 09/12/2019 08/31/2019 07/05/2019  WEIGHT 247 lb 0.6 oz 247 lb 248 lb  HEIGHT 5\' 3"  5\' 2"  5\' 2"   BMI 43.76 kg/m2 45.18 kg/m2 45.36 kg/m2

## 2019-09-12 NOTE — Assessment & Plan Note (Signed)
[name] is reminded of the importance of commitment to daily physical activity for 30 minutes or more, as able and the need to limit carbohydrate intake to 30 to 60 grams per meal to help with blood sugar control.   The need to take medication as prescribed, test blood sugar as directed, and to call between visits if there is a concern that blood sugar is uncontrolled is also discussed.   [name] is reminded of the importance of daily foot exam, annual eye examination, and good blood sugar, blood pressure and cholesterol control. Updated lab needed at/ before next visit.   Diabetic Labs Latest Ref Rng & Units 09/09/2019 06/28/2019 03/21/2019 11/15/2018 07/23/2018  HbA1c <5.7 % of total Hgb - 8.1(H) 7.8(H) 7.2(H) 7.6(H)  Microalbumin mg/dL 7.0 - - - 0.5  Micro/Creat Ratio <30 mcg/mg creat 50(H) - - - -  Chol <200 mg/dL - 158 281(H) - 166  HDL > OR = 50 mg/dL - 54 59 - 65  Calc LDL mg/dL (calc) - 84 194(H) - 82  Triglycerides <150 mg/dL - 106 130 - 93  Creatinine 0.50 - 0.99 mg/dL - 1.10(H) 1.04(H) 0.98 1.03(H)   BP/Weight 09/12/2019 08/31/2019 07/05/2019 03/29/2019 12/09/2018 11/16/2018 4/48/1856  Systolic BP 314 970 263 785 885 027 741  Diastolic BP 76 84 86 88 90 80 86  Wt. (Lbs) 247.04 247 248 250 252.8 251.04 255  BMI 43.76 45.18 45.36 45.73 46.24 45.92 46.64   Foot/eye exam completion dates Latest Ref Rng & Units 09/12/2019 08/06/2019  Eye Exam No Retinopathy - No Retinopathy  Foot Form Completion - Done -

## 2019-09-21 ENCOUNTER — Encounter: Payer: Self-pay | Admitting: Family Medicine

## 2019-09-21 LAB — CYTOLOGY - PAP
Comment: NEGATIVE
Diagnosis: NEGATIVE
High risk HPV: NEGATIVE

## 2019-09-26 MED FILL — METOPROLOL SUCCINATE ER 25: 25 | 30 days supply | Qty: 30 | Fill #1

## 2019-09-26 MED FILL — ROSUVASTATIN CALCIUM 40 MG: 40 | 30 days supply | Qty: 30 | Fill #6

## 2019-09-26 MED FILL — TRIAMTERENE-HCTZ 75-50 MG T: 75-50 | 30 days supply | Qty: 30 | Fill #7

## 2019-09-26 MED FILL — cloNIDine HCL 0.3 MG TABS: 0.3 | 30 days supply | Qty: 30 | Fill #5

## 2019-09-26 NOTE — Progress Notes (Signed)
Patient aware.

## 2019-09-27 MED FILL — MAGIC MOUTHWASH BOP FORM: 13 days supply | Qty: 250 | Fill #1

## 2019-10-18 ENCOUNTER — Other Ambulatory Visit: Payer: Self-pay | Admitting: Family Medicine

## 2019-10-18 LAB — BASIC METABOLIC PANEL WITH GFR
BUN/Creatinine Ratio: 13 (calc) (ref 6–22)
BUN: 15 mg/dL (ref 7–25)
CO2: 33 mmol/L — ABNORMAL HIGH (ref 20–32)
Calcium: 10 mg/dL (ref 8.6–10.4)
Chloride: 101 mmol/L (ref 98–110)
Creat: 1.14 mg/dL — ABNORMAL HIGH (ref 0.50–0.99)
GFR, Est African American: 58 mL/min/{1.73_m2} — ABNORMAL LOW (ref >=60)
GFR, Est Non African American: 50 mL/min/{1.73_m2} — ABNORMAL LOW (ref >=60)
Glucose, Bld: 119 mg/dL — ABNORMAL HIGH (ref 65–99)
Potassium: 3.6 mmol/L (ref 3.5–5.3)
Sodium: 141 mmol/L (ref 135–146)

## 2019-10-18 LAB — HEMOGLOBIN A1C
Hgb A1c MFr Bld: 8 % of total Hgb — ABNORMAL HIGH (ref <5.7)
Mean Plasma Glucose: 183 (calc)
eAG (mmol/L): 10.1 (calc)

## 2019-10-18 MED ORDER — GLIPIZIDE ER 5 MG PO TB24: 5.0000 mg | ORAL_TABLET | Freq: Every day | ORAL | 3 refills | Status: DC

## 2019-10-19 NOTE — Addendum Note (Signed)
Addended by: Eual Fines on: 10/19/2019 08:35 AM   Modules accepted: Orders

## 2019-10-24 ENCOUNTER — Other Ambulatory Visit: Payer: Self-pay | Admitting: Family Medicine

## 2019-10-24 MED FILL — ROSUVASTATIN CALCIUM 40 MG: 40 | 30 days supply | Qty: 30 | Fill #7

## 2019-10-24 MED FILL — TRIAMTERENE-HCTZ 75-50 MG T: 75-50 | 30 days supply | Qty: 30 | Fill #8

## 2019-10-24 MED FILL — cloNIDine HCL 0.3 MG TABS: 0.3 | 90 days supply | Qty: 90 | Fill #0

## 2019-10-24 MED FILL — METOPROLOL SUCCINATE ER 25: 25 | 30 days supply | Qty: 30 | Fill #2

## 2019-11-28 MED FILL — TRIAMTERENE-HCTZ 75-50 MG T: 75-50 | 30 days supply | Qty: 30 | Fill #0

## 2019-11-28 MED FILL — METOPROLOL SUCCINATE ER 25: 25 | 30 days supply | Qty: 30 | Fill #3

## 2019-11-28 MED FILL — ROSUVASTATIN CALCIUM 40 MG: 40 | 30 days supply | Qty: 30 | Fill #8

## 2019-11-28 MED FILL — LUMIGAN 0.01% EYE DROPS: 0.01 | 25 days supply | Qty: 3 | Fill #3

## 2019-12-19 ENCOUNTER — Ambulatory Visit (HOSPITAL_COMMUNITY)
Admission: RE | Admit: 2019-12-19 | Discharge: 2019-12-19 | Disposition: A | Discharge status: Home / Self Care | Payer: Medicare Other | Source: Ambulatory Visit | Attending: Family Medicine | Admitting: Family Medicine

## 2019-12-19 ENCOUNTER — Other Ambulatory Visit: Payer: Self-pay

## 2019-12-19 DIAGNOSIS — E8940 Asymptomatic postprocedural ovarian failure: Secondary | ICD-10-CM | POA: Diagnosis present

## 2019-12-19 DIAGNOSIS — Z1231 Encounter for screening mammogram for malignant neoplasm of breast: Secondary | ICD-10-CM | POA: Diagnosis present

## 2019-12-29 MED FILL — LUMIGAN 0.01% EYE DROPS: 0.01 | 25 days supply | Qty: 3 | Fill #4

## 2019-12-29 MED FILL — METOPROLOL SUCCINATE ER 25: 25 | 30 days supply | Qty: 30 | Fill #4

## 2019-12-29 MED FILL — ROSUVASTATIN CALCIUM 40 MG: 40 | 30 days supply | Qty: 30 | Fill #9

## 2019-12-29 MED FILL — VIT D2 1.25 MG (50,000 UNIT: 1.25 MG | 28 days supply | Qty: 4 | Fill #3

## 2019-12-29 MED FILL — TRIAMTERENE-HCTZ 75-50 MG T: 75-50 | 30 days supply | Qty: 30 | Fill #1

## 2020-01-08 ENCOUNTER — Ambulatory Visit: Payer: Medicare Other | Attending: Internal Medicine

## 2020-01-08 ENCOUNTER — Other Ambulatory Visit: Payer: Self-pay

## 2020-01-08 DIAGNOSIS — Z23 Encounter for immunization: Secondary | ICD-10-CM | POA: Insufficient documentation

## 2020-01-08 NOTE — Progress Notes (Signed)
   Covid-19 Vaccination Clinic  Name:  BETHANEE REDONDO    MRN: [id number] DOB: 08-13-1956  01/08/2020  Ms. Brocker was observed post Covid-19 immunization for 15 minutes without incidence. She was provided with Vaccine Information Sheet and instruction to access the V-Safe system.   Ms. Bugay was instructed to call 911 with any severe reactions post vaccine: Marland Kitchen Difficulty breathing  . Swelling of your face and throat  . A fast heartbeat  . A bad rash all over your body  . Dizziness and weakness    Immunizations Administered    Name Date Dose VIS Date Route   Moderna COVID-19 Vaccine 01/08/2020  3:41 PM 0.5 mL 11/01/2019 Intramuscular   Manufacturer: Moderna   Lot: [id number]   NDC: 25956-387-56

## 2020-01-30 ENCOUNTER — Encounter: Payer: Self-pay | Admitting: Family Medicine

## 2020-01-30 ENCOUNTER — Ambulatory Visit (INDEPENDENT_AMBULATORY_CARE_PROVIDER_SITE_OTHER): Payer: Medicare Other | Admitting: Family Medicine

## 2020-01-30 ENCOUNTER — Other Ambulatory Visit: Payer: Self-pay

## 2020-01-30 VITALS — BP 150/94 | HR 94 | Temp 98.7°F | Resp 15 | Ht 63.0 in | Wt 247.0 lb

## 2020-01-30 DIAGNOSIS — E7849 Other hyperlipidemia: Secondary | ICD-10-CM | POA: Diagnosis not present

## 2020-01-30 DIAGNOSIS — I10 Essential (primary) hypertension: Secondary | ICD-10-CM | POA: Diagnosis not present

## 2020-01-30 DIAGNOSIS — E1159 Type 2 diabetes mellitus with other circulatory complications: Secondary | ICD-10-CM | POA: Diagnosis not present

## 2020-01-30 MED ORDER — MONTELUKAST SODIUM 10 MG PO TABS: 10.0000 mg | ORAL_TABLET | Freq: Every day | ORAL | 1 refills | Status: DC

## 2020-01-30 MED ORDER — AMLODIPINE BESYLATE 5 MG PO TABS: 5.0000 mg | ORAL_TABLET | Freq: Every day | ORAL | 1 refills | Status: DC

## 2020-01-30 MED FILL — METOPROLOL SUCCINATE ER 25: 25 | 30 days supply | Qty: 30 | Fill #5

## 2020-01-30 MED FILL — AMLODIPINE BESYLATE 5 MG TA: 5 | 90 days supply | Qty: 90 | Fill #0

## 2020-01-30 MED FILL — cloNIDine HCL 0.3 MG TABS: 0.3 | 90 days supply | Qty: 90 | Fill #1

## 2020-01-30 MED FILL — ROSUVASTATIN CALCIUM 40 MG: 40 | 30 days supply | Qty: 30 | Fill #10

## 2020-01-30 MED FILL — TRIAMTERENE-HCTZ 75-50 MG T: 75-50 | 30 days supply | Qty: 30 | Fill #2

## 2020-01-30 MED FILL — MONTELUKAST SOD 10 MG TAB: 10 | 90 days supply | Qty: 90 | Fill #0

## 2020-01-30 NOTE — Patient Instructions (Addendum)
F/U in 3 months, call if you need me before  New additional medication for blood pressure is amlodipine 5 mg one daily, continue what you are taking We will call with labs  PLEASE reduce your stress  It is important that you exercise regularly at least 30 minutes 5 times a week. If you develop chest pain, have severe difficulty breathing, or feel very tired, stop exercising immediately and seek medical attention    Think about what you will eat, plan ahead. Choose " clean, green, fresh or frozen" over canned, processed or packaged foods which are more sugary, salty and fatty. 70 to 75% of food eaten should be vegetables and fruit. Three meals at set times with snacks allowed between meals, but they must be fruit or vegetables. Aim to eat over a 12 hour period , example 7 am to 7 pm, and STOP after  your last meal of the day. Drink water,generally about 64 ounces per day, no other drink is as healthy. Fruit juice is best enjoyed in a healthy way, by EATING the fruit.

## 2020-01-30 NOTE — Progress Notes (Signed)
VIENNA FOLDEN     MRN: [id number]      DOB: Aug 19, 1956   HPI Ms. Carriero is here for follow up and re-evaluation of chronic medical conditions, medication management and review of any available recent lab and radiology data.  Preventive health is updated, specifically  Cancer screening and Immunization.   Questions or concerns regarding consultations or procedures which the PT has had in the interim are  addressed. The PT denies any adverse reactions to current medications since the last visit.  There are no new concerns.  There are no specific complaints  Denies polyuria, polydipsia, blurred vision , or hypoglycemic episodes.   ROS Denies recent fever or chills. Denies sinus pressure, nasal congestion, ear pain or sore throat. Denies chest congestion, productive cough or wheezing. Denies chest pains, palpitations and leg swelling Denies abdominal pain, nausea, vomiting,diarrhea or constipation.   Denies dysuria, frequency, hesitancy or incontinence. Denies joint pain, swelling and limitation in mobility. Denies headaches, seizures, numbness, or tingling. Denies depression,c/o stress and anxiety as she learns to deal with her [place] in law with dementia, he denies insomnia. Denies skin break down or rash.   PE  BP (!) 150/94   Pulse 94   Temp 98.7 F (37.1 C) (Temporal)   Resp 15   Ht 5\' 3"  (1.6 m)   Wt 247 lb (112 kg)   SpO2 96%   BMI 43.75 kg/m   Patient alert and oriented and in no cardiopulmonary distress.  HEENT: No facial asymmetry, EOMI,     Neck supple .  Chest: Clear to auscultation bilaterally.  CVS: S1, S2 no murmurs, no S3.Regular rate.  ABD: Soft non tender.   Ext: No edema  MS: Adequate ROM spine, shoulders, hips and knees.  Skin: Intact, no ulcerations or rash noted.  Psych: Good eye contact, normal affect. Memory intact not anxious or depressed appearing.  CNS: CN 2-12 intact, power,  normal throughout.no focal deficits noted.   Assessment  & Plan  Hypertension goal BP (blood pressure) < 130/80 Uncontrolled, add amlodipine DASH diet and commitment to daily physical activity for a minimum of 30 minutes discussed and encouraged, as a part of hypertension management. The importance of attaining a healthy weight is also discussed.  BP/Weight 01/30/2020 09/12/2019 08/31/2019 07/05/2019 03/29/2019 12/09/2018 38/75/6433  Systolic BP 295 188 416 606 301 601 093  Diastolic BP 94 76 84 86 88 90 80  Wt. (Lbs) 247 247.04 247 248 250 252.8 251.04  BMI 43.75 43.76 45.18 45.36 45.73 46.24 45.92       Type 2 diabetes mellitus with vascular disease ([place]) Ms. Mccosh is reminded of the importance of commitment to daily physical activity for 30 minutes or more, as able and the need to limit carbohydrate intake to 30 to 60 grams per meal to help with blood sugar control.   The need to take medication as prescribed, test blood sugar as directed, and to call between visits if there is a concern that blood sugar is uncontrolled is also discussed.   Ms. Strausser is reminded of the importance of daily foot exam, annual eye examination, and good blood sugar, blood pressure and cholesterol control. Uncontrolled and deteriorated , dietary Diabetic Labs Latest Ref Rng & Units 01/30/2020 10/17/2019 09/09/2019 06/28/2019 03/21/2019  HbA1c <5.7 % of total Hgb 8.2(H) 8.0(H) - 8.1(H) 7.8(H)  Microalbumin mg/dL - - 7.0 - -  Micro/Creat Ratio <30 mcg/mg creat - - 50(H) - -  Chol <200 mg/dL 160 - -  158 281(H)  HDL > OR = 50 mg/dL 55 - - 54 59  Calc LDL mg/dL (calc) 88 - - 84 983(J)  Triglycerides <150 mg/dL 77 - - 825 053  Creatinine 0.50 - 0.99 mg/dL 9.76(B) 3.41(P) - 3.79(K) 1.04(H)   BP/Weight 01/30/2020 09/12/2019 08/31/2019 07/05/2019 03/29/2019 12/09/2018 11/16/2018  Systolic BP 150 122 134 130 130 170 128  Diastolic BP 94 76 84 86 88 90 80  Wt. (Lbs) 247 247.04 247 248 250 252.8 251.04  BMI 43.75 43.76 45.18 45.36 45.73 46.24 45.92   Foot/eye exam completion dates  Latest Ref Rng & Units 09/12/2019 08/06/2019  Eye Exam No Retinopathy - No Retinopathy  Foot Form Completion - Done -        Hyperlipidemia Hyperlipidemia:Low fat diet discussed and encouraged.   Lipid Panel  Lab Results  Component Value Date   CHOL 160 01/30/2020   HDL 55 01/30/2020   LDLCALC 88 01/30/2020   TRIG 77 01/30/2020   CHOLHDL 2.9 01/30/2020   Controlled, no change in medication     Morbid obesity  Patient re-educated about  the importance of commitment to a  minimum of 150 minutes of exercise per week as able.  The importance of healthy food choices with portion control discussed, as well as eating regularly and within a 12 hour window most days. The need to choose "clean , green" food 50 to 75% of the time is discussed, as well as to make water the primary drink and set a goal of 64 ounces water daily.    Weight /BMI 01/30/2020 09/12/2019 08/31/2019  WEIGHT 247 lb 247 lb 0.6 oz 247 lb  HEIGHT 5\' 3"  5\' 3"  5\' 2"   BMI 43.75 kg/m2 43.76 kg/m2 45.18 kg/m2

## 2020-01-31 ENCOUNTER — Other Ambulatory Visit: Payer: Self-pay | Admitting: Family Medicine

## 2020-01-31 LAB — COMPLETE METABOLIC PANEL WITH GFR
AG Ratio: 1.6 (calc) (ref 1.0–2.5)
ALT: 18 U/L (ref 6–29)
AST: 16 U/L (ref 10–35)
Albumin: 4.5 g/dL (ref 3.6–5.1)
Alkaline phosphatase (APISO): 86 U/L (ref 37–153)
BUN/Creatinine Ratio: 15 (calc) (ref 6–22)
BUN: 17 mg/dL (ref 7–25)
CO2: 29 mmol/L (ref 20–32)
Calcium: 10.2 mg/dL (ref 8.6–10.4)
Chloride: 101 mmol/L (ref 98–110)
Creat: 1.15 mg/dL — ABNORMAL HIGH (ref 0.50–0.99)
GFR, Est African American: 58 mL/min/{1.73_m2} — ABNORMAL LOW (ref >=60)
GFR, Est Non African American: 50 mL/min/{1.73_m2} — ABNORMAL LOW (ref >=60)
Globulin: 2.8 g/dL (calc) (ref 1.9–3.7)
Glucose, Bld: 172 mg/dL — ABNORMAL HIGH (ref 65–99)
Potassium: 3.5 mmol/L (ref 3.5–5.3)
Sodium: 141 mmol/L (ref 135–146)
Total Bilirubin: 0.9 mg/dL (ref 0.2–1.2)
Total Protein: 7.3 g/dL (ref 6.1–8.1)

## 2020-01-31 LAB — LIPID PANEL
Cholesterol: 160 mg/dL (ref <200)
HDL: 55 mg/dL (ref >=50)
LDL Cholesterol (Calc): 88 mg/dL (calc)
Non-HDL Cholesterol (Calc): 105 mg/dL (calc) (ref <130)
Total CHOL/HDL Ratio: 2.9 (calc) (ref <5.0)
Triglycerides: 77 mg/dL (ref <150)

## 2020-01-31 LAB — HEMOGLOBIN A1C
Hgb A1c MFr Bld: 8.2 % of total Hgb — ABNORMAL HIGH (ref <5.7)
Mean Plasma Glucose: 189 (calc)
eAG (mmol/L): 10.4 (calc)

## 2020-01-31 MED ORDER — GLIPIZIDE ER 10 MG PO TB24: 10.0000 mg | ORAL_TABLET | Freq: Every day | ORAL | 1 refills | Status: DC

## 2020-01-31 MED FILL — GLIPIZIDE ER 10 MG TB24: 10 | 90 days supply | Qty: 90 | Fill #0

## 2020-02-01 ENCOUNTER — Ambulatory Visit: Payer: Medicare Other | Admitting: Family Medicine

## 2020-02-02 ENCOUNTER — Encounter: Payer: Self-pay | Admitting: Family Medicine

## 2020-02-02 NOTE — Assessment & Plan Note (Addendum)
Tina Wilkins is reminded of the importance of commitment to daily physical activity for 30 minutes or more, as able and the need to limit carbohydrate intake to 30 to 60 grams per meal to help with blood sugar control.   The need to take medication as prescribed, test blood sugar as directed, and to call between visits if there is a concern that blood sugar is uncontrolled is also discussed.   Tina Wilkins is reminded of the importance of daily foot exam, annual eye examination, and good blood sugar, blood pressure and cholesterol control. Uncontrolled and deteriorated , dietary Diabetic Labs Latest Ref Rng & Units 01/30/2020 10/17/2019 09/09/2019 06/28/2019 03/21/2019  HbA1c <5.7 % of total Hgb 8.2(H) 8.0(H) - 8.1(H) 7.8(H)  Microalbumin mg/dL - - 7.0 - -  Micro/Creat Ratio <30 mcg/mg creat - - 50(H) - -  Chol <200 mg/dL 992 - - 426 834(H)  HDL > OR = 50 mg/dL 55 - - 54 59  Calc LDL mg/dL (calc) 88 - - 84 962(I)  Triglycerides <150 mg/dL 77 - - 297 989  Creatinine 0.50 - 0.99 mg/dL 2.11(H) 4.17(E) - 0.81(K) 1.04(H)   BP/Weight 01/30/2020 09/12/2019 08/31/2019 07/05/2019 03/29/2019 12/09/2018 11/16/2018  Systolic BP 150 122 134 130 130 170 128  Diastolic BP 94 76 84 86 88 90 80  Wt. (Lbs) 247 247.04 247 248 250 252.8 251.04  BMI 43.75 43.76 45.18 45.36 45.73 46.24 45.92   Foot/eye exam completion dates Latest Ref Rng & Units 09/12/2019 08/06/2019  Eye Exam No Retinopathy - No Retinopathy  Foot Form Completion - Done -

## 2020-02-02 NOTE — Assessment & Plan Note (Signed)
Uncontrolled, add amlodipine DASH diet and commitment to daily physical activity for a minimum of 30 minutes discussed and encouraged, as a part of hypertension management. The importance of attaining a healthy weight is also discussed.  BP/Weight 01/30/2020 09/12/2019 08/31/2019 07/05/2019 03/29/2019 12/09/2018 11/16/2018  Systolic BP 150 122 134 130 130 170 128  Diastolic BP 94 76 84 86 88 90 80  Wt. (Lbs) 247 247.04 247 248 250 252.8 251.04  BMI 43.75 43.76 45.18 45.36 45.73 46.24 45.92

## 2020-02-02 NOTE — Assessment & Plan Note (Signed)
Hyperlipidemia:Low fat diet discussed and encouraged.   Lipid Panel  Lab Results  Component Value Date   CHOL 160 01/30/2020   HDL 55 01/30/2020   LDLCALC 88 01/30/2020   TRIG 77 01/30/2020   CHOLHDL 2.9 01/30/2020   Controlled, no change in medication

## 2020-02-02 NOTE — Assessment & Plan Note (Signed)
  Patient re-educated about  the importance of commitment to a  minimum of 150 minutes of exercise per week as able.  The importance of healthy food choices with portion control discussed, as well as eating regularly and within a 12 hour window most days. The need to choose "clean , green" food 50 to 75% of the time is discussed, as well as to make water the primary drink and set a goal of 64 ounces water daily.    Weight /BMI 01/30/2020 09/12/2019 08/31/2019  WEIGHT 247 lb 247 lb 0.6 oz 247 lb  HEIGHT 5\' 3"  5\' 3"  5\' 2"   BMI 43.75 kg/m2 43.76 kg/m2 45.18 kg/m2

## 2020-02-06 ENCOUNTER — Telehealth: Payer: Self-pay

## 2020-02-06 NOTE — Telephone Encounter (Signed)
Pt is returning nurse call.

## 2020-02-06 NOTE — Telephone Encounter (Signed)
Left voicemail that glipizide was being increased to 10mg  from 5 and it was sent in and to call back with any questions

## 2020-02-08 ENCOUNTER — Ambulatory Visit: Payer: Medicare Other | Attending: Internal Medicine

## 2020-02-08 ENCOUNTER — Other Ambulatory Visit: Payer: Self-pay

## 2020-02-08 DIAGNOSIS — Z23 Encounter for immunization: Secondary | ICD-10-CM | POA: Insufficient documentation

## 2020-02-08 NOTE — Progress Notes (Signed)
   Cov[facility] Vaccination [facility]  Name:  Tina Wilkins    MRN: [id number] DOB: 04/01/1956  02/08/2020  Tina Wilkins was observed post Covid-19 immunization for 15 minutes without incident. She was provided with Vaccine Information Sheet and instruction to access the V-Safe system.   Tina Wilkins was instructed to call 911 with any severe reactions post vaccine: Marland Kitchen Difficulty breathing  . Swelling of face and throat  . A fast heartbeat  . A bad rash all over body  . Dizziness and weakness   Immunizations Administered    Name Date Dose VIS Date Route   Moderna COVID-19 Vaccine 02/08/2020  1:20 PM 0.5 mL 11/01/2019 Intramuscular   Manufacturer: Moderna   Lot: [id number]   NDC: 23200-941-79

## 2020-02-27 ENCOUNTER — Other Ambulatory Visit: Payer: Self-pay | Admitting: *Deleted

## 2020-02-27 MED ORDER — ROSUVASTATIN CALCIUM 40 MG PO TABS: 40.0000 mg | ORAL_TABLET | Freq: Every day | ORAL | 3 refills | Status: DC

## 2020-02-27 MED FILL — ROSUVASTATIN CALCIUM 40 MG: 40 | 30 days supply | Qty: 30 | Fill #11

## 2020-02-27 MED FILL — METOPROLOL SUCCINATE ER 25: 25 | 30 days supply | Qty: 30 | Fill #6

## 2020-02-27 MED FILL — TRIAMTERENE-HCTZ 75-50 MG T: 75-50 | 30 days supply | Qty: 30 | Fill #3

## 2020-03-24 IMAGING — RF DG ESOPHAGUS
8 of 13 series · 14 of 22 positions shown · non-contrast
Comparison: None

CLINICAL DATA: Dysphagia with solids in tablets. Right-sided throat
pain.

EXAM:
ESOPHOGRAM / BARIUM SWALLOW / BARIUM TABLET STUDY
TECHNIQUE: Combined double contrast and single contrast examination performed
using effervescent crystals, thick barium liquid, and thin barium
liquid. The patient was observed with fluoroscopy swallowing a 13 mm
barium sulphate tablet.
FLUOROSCOPY TIME:  Fluoroscopy Time:  1 minutes 6 seconds

[Series 1: cp_standard · 0.26mm/px · 3 of 79 frames shown (1 of 3)]
[frame 4/79]
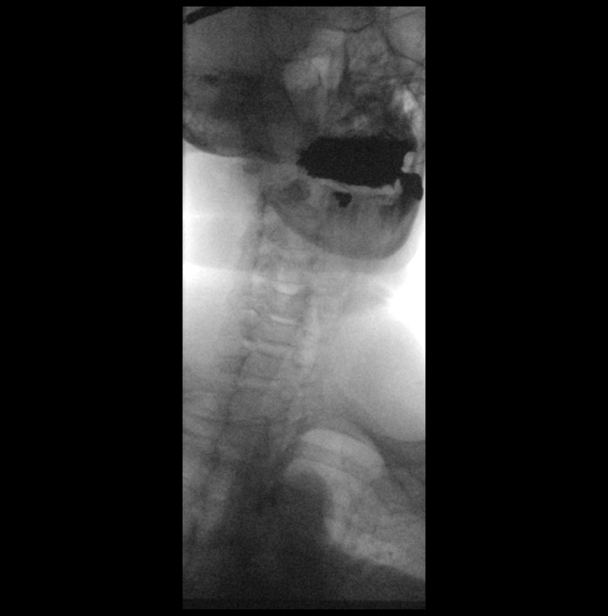
[frame 40/79]
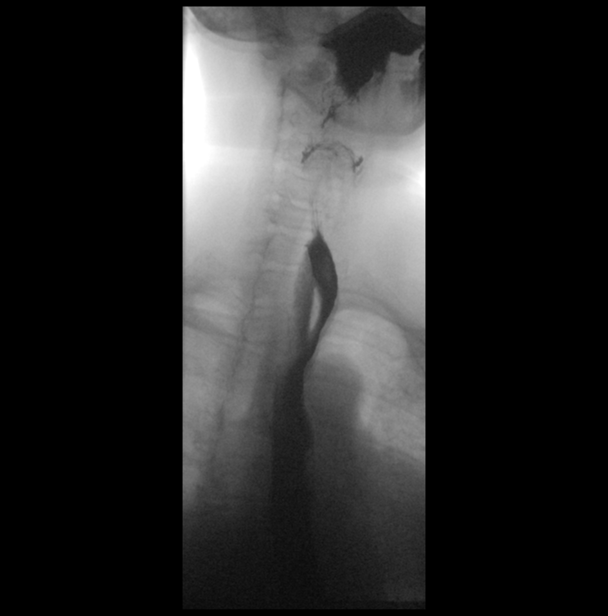
[frame 68/79]
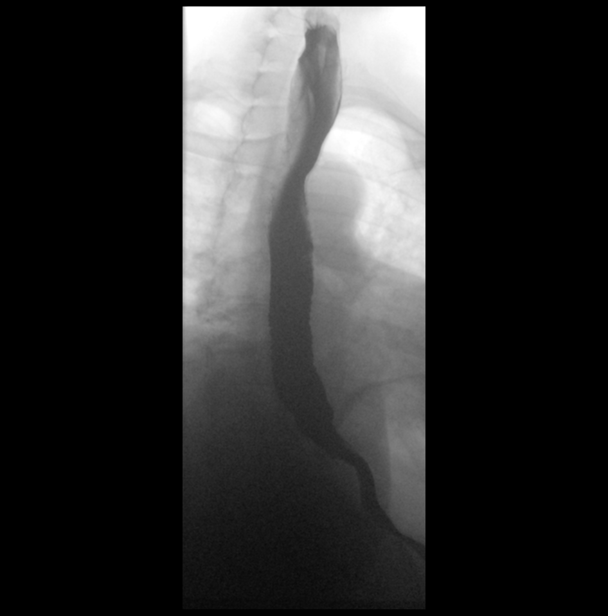

[Series 3: fluoro_barium 2fps_bw · 0.17mm/px · 1 of 1 slices shown (1 of 5)]
[im 1/1]
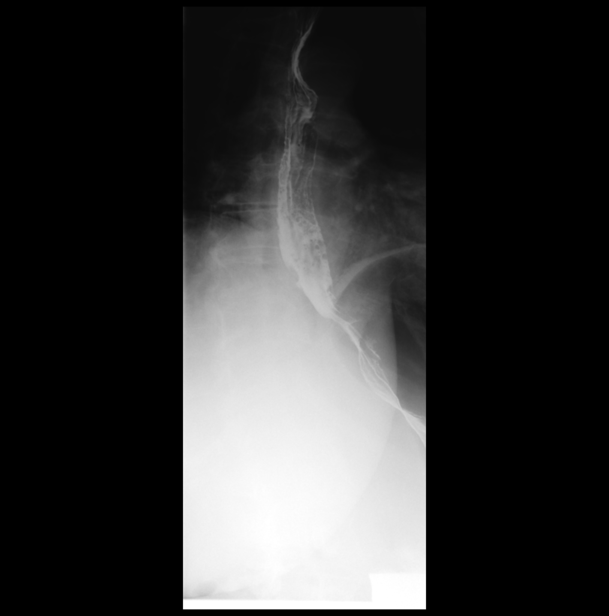

[Series 5: fluoro_barium 2fps_bw · 0.17mm/px · 1 of 1 slices shown (2 of 5)]
[im 1/1]
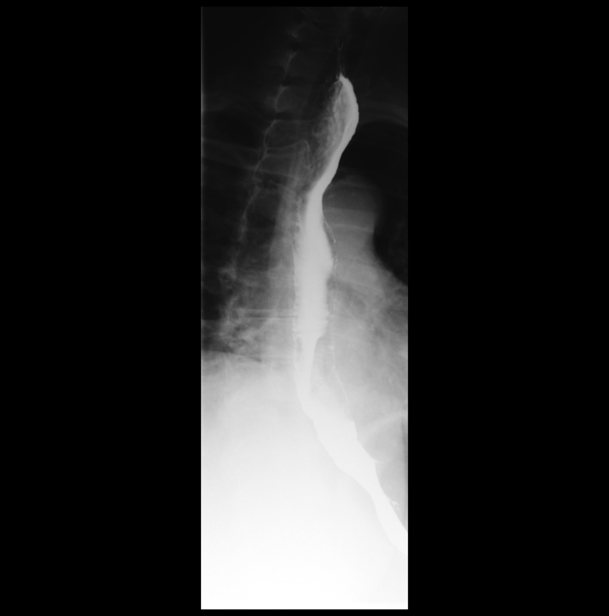

[Series 6: fluoro_barium 2fps_bw · 0.18mm/px · 1 of 1 slices shown (3 of 5)]
[im 1/1]
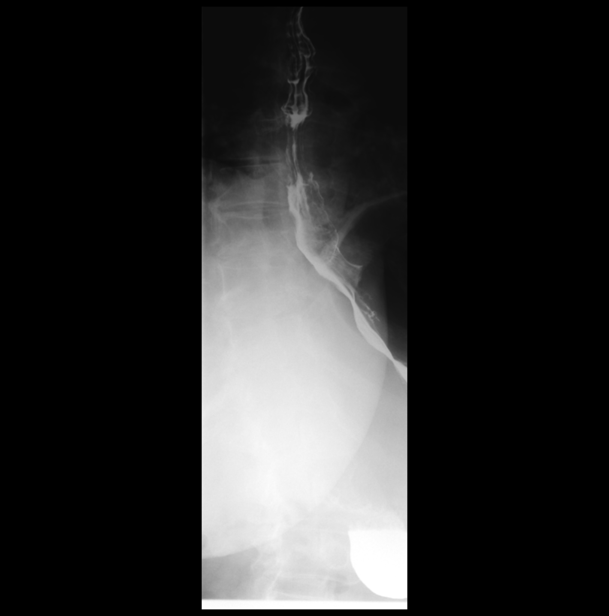

[Series 8: cp_standard · 0.29mm/px · 3 of 131 frames shown (2 of 3)]
[frame 10/131]
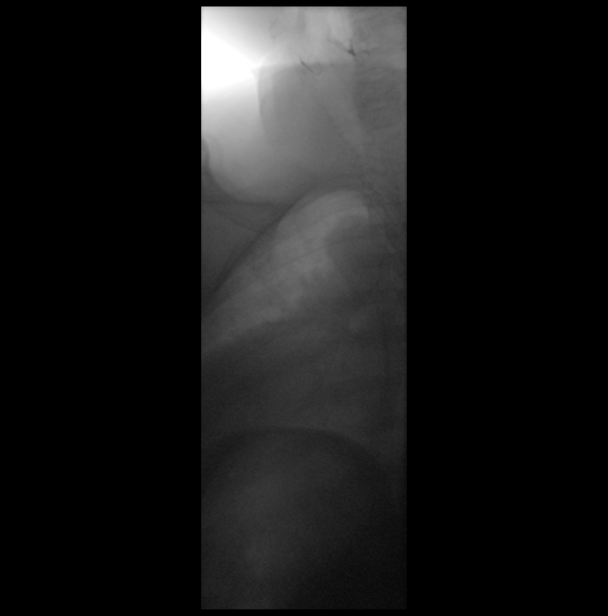
[frame 20/131]
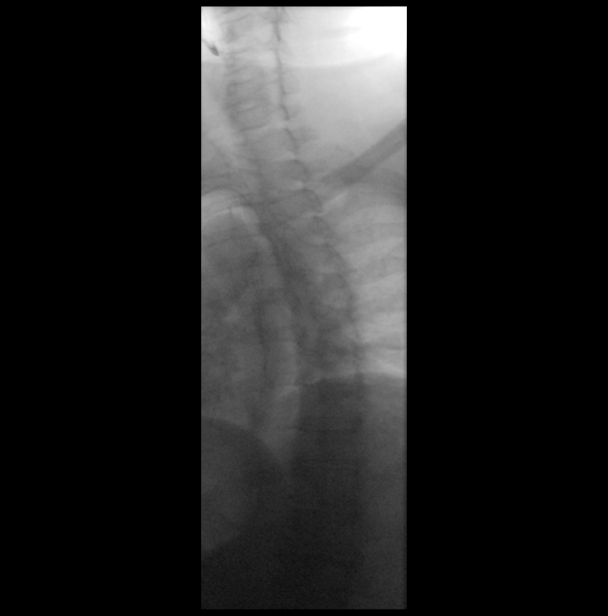
[frame 112/131]
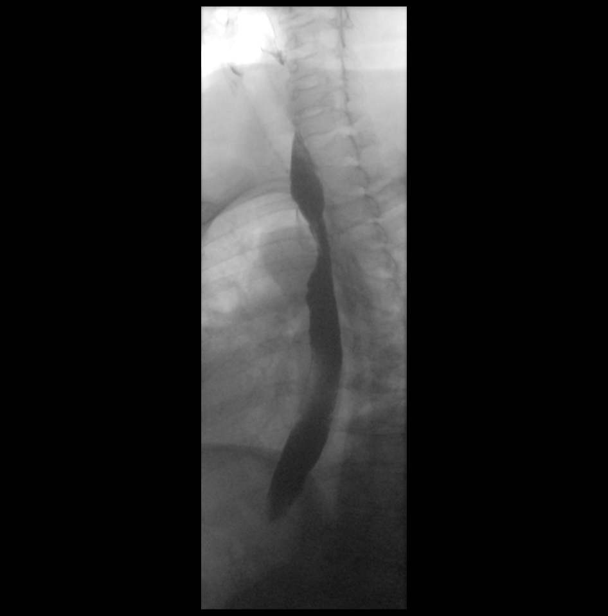

[Series 9: fluoro_barium 2fps_bw · 0.20mm/px · 1 of 1 slices shown (4 of 5)]
[im 1/1]
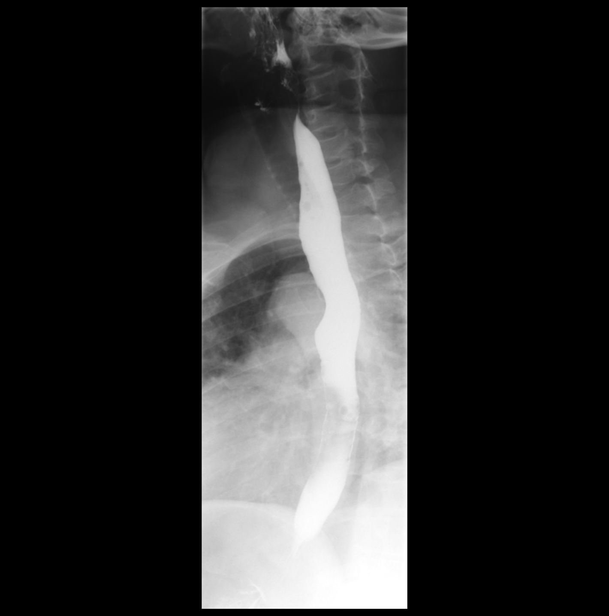

[Series 11: fluoro_barium 2fps_bw · 0.20mm/px · 1 of 1 slices shown (5 of 5)]
[im 1/1]
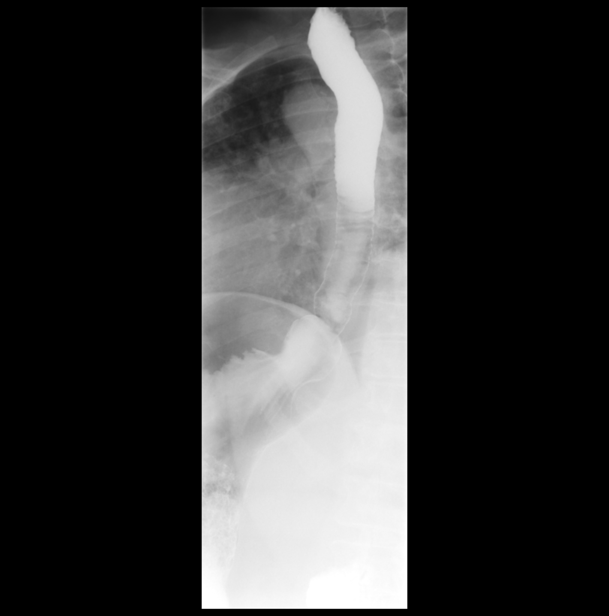

[Series 13: cp_standard · 0.29mm/px · 3 of 156 frames shown (3 of 3)]
[frame 13/156]
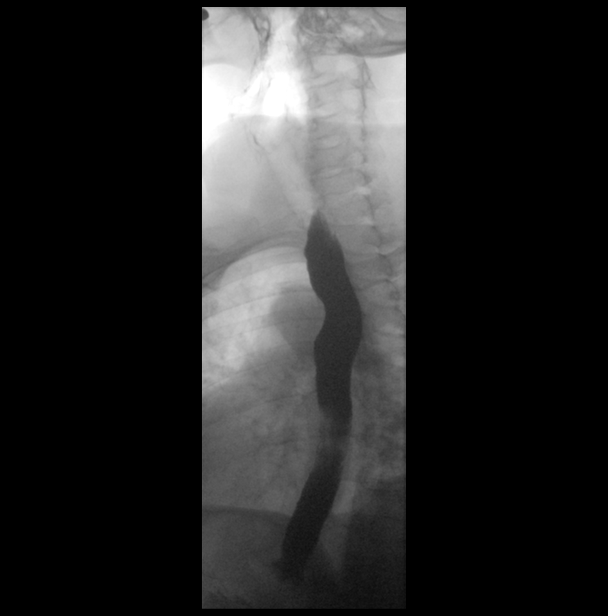
[frame 24/156]
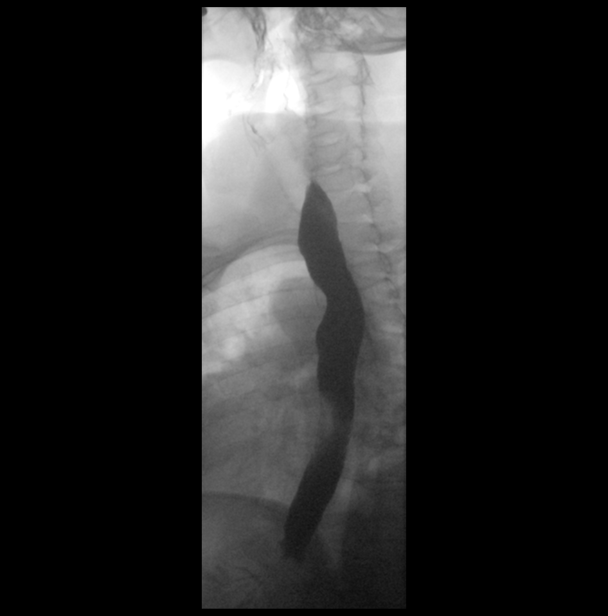
[frame 133/156]
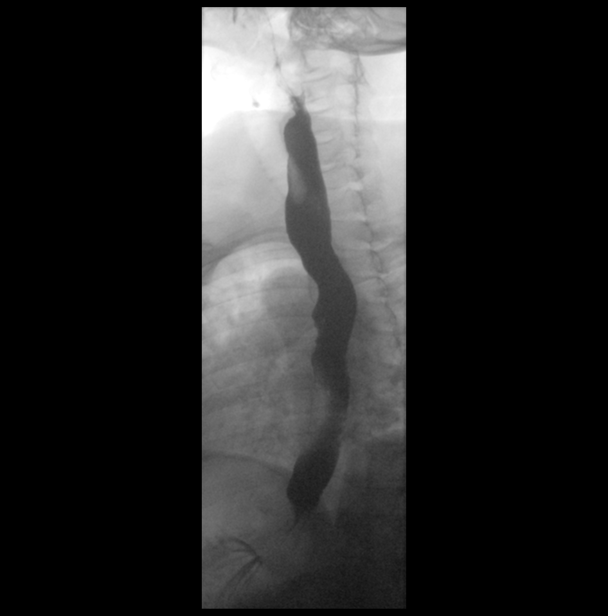

[14 of 22 positions shown; findings below may reference images not displayed]

FINDINGS: The oropharyngeal swallowing mechanisms are normal. The valleculae
and piriform sinuses are normal.

The mucosa of the esophagus is normal. The patient has occasional
tertiary contractions and intermittently there is a poor secondary
stripping wave with to and fro peristalsis. There is a small sliding
hiatal hernia. No stricture. A 13 mm barium tablet passed
immediately from the mouth to the stomach with no delay.
IMPRESSION: 1. Small sliding hiatal hernia.
2. Mild esophageal motility disorder.

## 2020-03-29 ENCOUNTER — Other Ambulatory Visit: Payer: Self-pay | Admitting: Family Medicine

## 2020-03-29 ENCOUNTER — Other Ambulatory Visit: Payer: Self-pay | Admitting: Cardiology

## 2020-03-29 DIAGNOSIS — I10 Essential (primary) hypertension: Secondary | ICD-10-CM

## 2020-03-29 MED FILL — ROSUVASTATIN CALCIUM 40 MG: 40 | 30 days supply | Qty: 30 | Fill #0

## 2020-03-29 MED FILL — TRIAMTERENE-HCTZ 75-50 MG T: 75-50 | 30 days supply | Qty: 30 | Fill #0

## 2020-03-29 MED FILL — METOPROLOL SUCCINATE ER 25: 25 | 30 days supply | Qty: 30 | Fill #0

## 2020-04-19 ENCOUNTER — Other Ambulatory Visit: Payer: Self-pay | Admitting: Cardiology

## 2020-04-19 ENCOUNTER — Other Ambulatory Visit: Payer: Self-pay | Admitting: *Deleted

## 2020-04-19 MED ORDER — METOPROLOL SUCCINATE ER 25 MG PO TB24: 25.0000 mg | ORAL_TABLET | Freq: Every day | ORAL | 6 refills | Status: DC

## 2020-05-01 ENCOUNTER — Other Ambulatory Visit: Payer: Self-pay

## 2020-05-01 ENCOUNTER — Other Ambulatory Visit: Payer: Self-pay | Admitting: Family Medicine

## 2020-05-01 ENCOUNTER — Ambulatory Visit (INDEPENDENT_AMBULATORY_CARE_PROVIDER_SITE_OTHER): Payer: Medicare Other | Admitting: Family Medicine

## 2020-05-01 ENCOUNTER — Encounter: Payer: Self-pay | Admitting: Family Medicine

## 2020-05-01 VITALS — BP 160/90 | HR 74 | Temp 97.5°F | Resp 15 | Ht 63.0 in | Wt 243.0 lb

## 2020-05-01 DIAGNOSIS — E7849 Other hyperlipidemia: Secondary | ICD-10-CM

## 2020-05-01 DIAGNOSIS — I10 Essential (primary) hypertension: Secondary | ICD-10-CM | POA: Diagnosis not present

## 2020-05-01 DIAGNOSIS — E1159 Type 2 diabetes mellitus with other circulatory complications: Secondary | ICD-10-CM

## 2020-05-01 MED ORDER — GLIPIZIDE ER 10 MG PO TB24: 10.0000 mg | ORAL_TABLET | Freq: Every day | ORAL | 5 refills | Status: DC

## 2020-05-01 MED ORDER — TRIAMTERENE-HCTZ 75-50 MG PO TABS: 1.0000 | ORAL_TABLET | Freq: Every day | ORAL | 5 refills | Status: DC

## 2020-05-01 MED ORDER — CLONIDINE HCL 0.3 MG PO TABS: 0.3000 mg | ORAL_TABLET | Freq: Every day | ORAL | 5 refills | Status: DC

## 2020-05-01 MED ORDER — VITAMIN D (ERGOCALCIFEROL) 1.25 MG (50000 UNIT) PO CAPS: ORAL_CAPSULE | ORAL | 5 refills | Status: DC

## 2020-05-01 MED ORDER — ROSUVASTATIN CALCIUM 40 MG PO TABS: 40.0000 mg | ORAL_TABLET | Freq: Every day | ORAL | 5 refills | Status: DC

## 2020-05-01 MED ORDER — AMLODIPINE BESYLATE 10 MG PO TABS: 10.0000 mg | ORAL_TABLET | Freq: Every day | ORAL | 3 refills | Status: DC

## 2020-05-01 MED ORDER — MONTELUKAST SODIUM 10 MG PO TABS: 10.0000 mg | ORAL_TABLET | Freq: Every day | ORAL | 5 refills | Status: DC

## 2020-05-01 MED ORDER — PANTOPRAZOLE SODIUM 40 MG PO TBEC: 40.0000 mg | DELAYED_RELEASE_TABLET | Freq: Every day | ORAL | 5 refills | Status: DC

## 2020-05-01 MED ORDER — METFORMIN HCL 1000 MG PO TABS: ORAL_TABLET | ORAL | 5 refills | Status: DC

## 2020-05-01 MED FILL — VIT D2 1.25 MG (50,000 UNIT: 1.25 MG | 28 days supply | Qty: 4 | Fill #0

## 2020-05-01 MED FILL — ROSUVASTATIN CALCIUM 40 MG: 40 | 30 days supply | Qty: 30 | Fill #1

## 2020-05-01 MED FILL — AMLODIPINE BESYLATE 10 MG T: 10 | 90 days supply | Qty: 90 | Fill #0

## 2020-05-01 MED FILL — METOPROLOL SUCCINATE ER 25: 25 | 30 days supply | Qty: 30 | Fill #1

## 2020-05-01 MED FILL — TRIAMTERENE-HCTZ 75-50 MG T: 75-50 | 30 days supply | Qty: 30 | Fill #1

## 2020-05-01 MED FILL — GLIPIZIDE ER 10 MG TB24: 10 | 30 days supply | Qty: 30 | Fill #0

## 2020-05-01 MED FILL — PANTOPRAZOLE SOD DR 40 MG T: 40 | 30 days supply | Qty: 30 | Fill #0

## 2020-05-01 MED FILL — cloNIDine HCL 0.3 MG TABS: 0.3 | 30 days supply | Qty: 30 | Fill #0

## 2020-05-01 NOTE — Assessment & Plan Note (Signed)
Uncontrolled. Updated lab needed at/ before next visit. Tina Wilkins is reminded of the importance of commitment to daily physical activity for 30 minutes or more, as able and the need to limit carbohydrate intake to 30 to 60 grams per meal to help with blood sugar control.   The need to take medication as prescribed, test blood sugar as directed, and to call between visits if there is a concern that blood sugar is uncontrolled is also discussed.   Tina Wilkins is reminded of the importance of daily foot exam, annual eye examination, and good blood sugar, blood pressure and cholesterol control.  Diabetic Labs Latest Ref Rng & Units 01/30/2020 10/17/2019 09/09/2019 06/28/2019 03/21/2019  HbA1c <5.7 % of total Hgb 8.2(H) 8.0(H) - 8.1(H) 7.8(H)  Microalbumin mg/dL - - 7.0 - -  Micro/Creat Ratio <30 mcg/mg creat - - 50(H) - -  Chol <200 mg/dL 616 - - 837 290(S)  HDL > OR = 50 mg/dL 55 - - 54 59  Calc LDL mg/dL (calc) 88 - - 84 111(B)  Triglycerides <150 mg/dL 77 - - 520 802  Creatinine 0.50 - 0.99 mg/dL 2.33(K) 1.22(E) - 4.97(N) 1.04(H)   BP/Weight 05/01/2020 01/30/2020 09/12/2019 08/31/2019 07/05/2019 03/29/2019 12/09/2018  Systolic BP 160 150 122 134 130 130 170  Diastolic BP 90 94 76 84 86 88 90  Wt. (Lbs) 243 247 247.04 247 248 250 252.8  BMI 43.05 43.75 43.76 45.18 45.36 45.73 46.24   Foot/eye exam completion dates Latest Ref Rng & Units 09/12/2019 08/06/2019  Eye Exam No Retinopathy - No Retinopathy  Foot Form Completion - Done -

## 2020-05-01 NOTE — Assessment & Plan Note (Signed)
Uncontrolled, increase amlodipine to 10 mg  DASH diet and commitment to daily physical activity for a minimum of 30 minutes discussed and encouraged, as a part of hypertension management. The importance of attaining a healthy weight is also discussed.  BP/Weight 05/01/2020 01/30/2020 09/12/2019 08/31/2019 07/05/2019 03/29/2019 12/09/2018  Systolic BP 160 150 122 134 130 130 170  Diastolic BP 90 94 76 84 86 88 90  Wt. (Lbs) 243 247 247.04 247 248 250 252.8  BMI 43.05 43.75 43.76 45.18 45.36 45.73 46.24

## 2020-05-01 NOTE — Assessment & Plan Note (Signed)
  Patient re-educated about  the importance of commitment to a  minimum of 150 minutes of exercise per week as able.  The importance of healthy food choices with portion control discussed, as well as eating regularly and within a 12 hour window most days. The need to choose "clean , green" food 50 to 75% of the time is discussed, as well as to make water the primary drink and set a goal of 64 ounces water daily.    Weight /BMI 05/01/2020 01/30/2020 09/12/2019  WEIGHT 243 lb 247 lb 247 lb 0.6 oz  HEIGHT 5\' 3"  5\' 3"  5\' 3"   BMI 43.05 kg/m2 43.75 kg/m2 43.76 kg/m2

## 2020-05-01 NOTE — Addendum Note (Signed)
Addended by: Abner Greenspan on: 05/01/2020 09:56 AM   Modules accepted: Orders

## 2020-05-01 NOTE — Progress Notes (Signed)
ALEXXIS MACKERT     MRN: [id number]      DOB: 01/10/1956   HPI Ms. Klingel is here for follow up and re-evaluation of chronic medical conditions, medication management and review of any available recent lab and radiology data.  Preventive health is updated, specifically  Cancer screening and Immunization.   Questions or concerns regarding consultations or procedures which the PT has had in the interim are  addressed. The PT denies any adverse reactions to current medications since the last visit.  There are no new concerns.  fBG range is 140 to 170, bedtime 120 to 1 40 Denies polyuria, polydipsia, blurred vision , or hypoglycemic episodes. Exercises on avg 30 mins , 5 days  Per week ROS Denies recent fever or chills. Denies sinus pressure, nasal congestion, ear pain or sore throat. Denies chest congestion, productive cough or wheezing. Denies chest pains, palpitations and leg swelling Denies abdominal pain, nausea, vomiting,diarrhea or constipation.   Denies dysuria, frequency, hesitancy or incontinence. Denies joint pain, swelling and limitation in mobility. Denies headaches, seizures, numbness, or tingling. Denies depression, anxiety or insomnia. Denies skin break down or rash.   PE  BP (!) 160/90   Pulse 74   Temp (!) 97.5 F (36.4 C) (Temporal)   Resp 15   Ht 5\' 3"  (1.6 m)   Wt 243 lb (110.2 kg)   SpO2 98%   BMI 43.05 kg/m   Patient alert and oriented and in no cardiopulmonary distress.  HEENT: No facial asymmetry, EOMI,     Neck supple .  Chest: Clear to auscultation bilaterally.  CVS: S1, S2 no murmurs, no S3.Regular rate.  ABD: Soft non tender.   Ext: No edema  MS: Adequate ROM spine, shoulders, hips and knees.  Skin: Intact, no ulcerations or rash noted.  Psych: Good eye contact, normal affect. Memory intact not anxious or depressed appearing.  CNS: CN 2-12 intact, power,  normal throughout.no focal deficits noted.   Assessment &  Plan  Hypertension goal BP (blood pressure) < 130/80 Uncontrolled, increase amlodipine to 10 mg  DASH diet and commitment to daily physical activity for a minimum of 30 minutes discussed and encouraged, as a part of hypertension management. The importance of attaining a healthy weight is also discussed.  BP/Weight 05/01/2020 01/30/2020 09/12/2019 08/31/2019 07/05/2019 03/29/2019 12/09/2018  Systolic BP 160 150 122 134 130 130 170  Diastolic BP 90 94 76 84 86 88 90  Wt. (Lbs) 243 247 247.04 247 248 250 252.8  BMI 43.05 43.75 43.76 45.18 45.36 45.73 46.24       Morbid obesity  Patient re-educated about  the importance of commitment to a  minimum of 150 minutes of exercise per week as able.  The importance of healthy food choices with portion control discussed, as well as eating regularly and within a 12 hour window most days. The need to choose "clean , green" food 50 to 75% of the time is discussed, as well as to make water the primary drink and set a goal of 64 ounces water daily.    Weight /BMI 05/01/2020 01/30/2020 09/12/2019  WEIGHT 243 lb 247 lb 247 lb 0.6 oz  HEIGHT 5\' 3"  5\' 3"  5\' 3"   BMI 43.05 kg/m2 43.75 kg/m2 43.76 kg/m2      Hyperlipidemia Controlled, no change in medication Hyperlipidemia:Low fat diet discussed and encouraged.   Lipid Panel  Lab Results  Component Value Date   CHOL 160 01/30/2020   HDL 55 01/30/2020  [place] 88 01/30/2020   TRIG 77 01/30/2020   CHOLHDL 2.9 01/30/2020       Type 2 diabetes mellitus with vascular disease ([place]) Uncontrolled. Updated lab needed at/ before next visit. Ms. Werst is reminded of the importance of commitment to daily physical activity for 30 minutes or more, as able and the need to limit carbohydrate intake to 30 to 60 grams per meal to help with blood sugar control.   The need to take medication as prescribed, test blood sugar as directed, and to call between visits if there is a concern that blood sugar is uncontrolled  is also discussed.   Ms. Gohlke is reminded of the importance of daily foot exam, annual eye examination, and good blood sugar, blood pressure and cholesterol control.  Diabetic Labs Latest Ref Rng & Units 01/30/2020 10/17/2019 09/09/2019 06/28/2019 03/21/2019  HbA1c <5.7 % of total Hgb 8.2(H) 8.0(H) - 8.1(H) 7.8(H)  Microalbumin mg/dL - - 7.0 - -  Micro/Creat Ratio <30 mcg/mg creat - - 50(H) - -  Chol <200 mg/dL 160 - - 158 281(H)  HDL > OR = 50 mg/dL 55 - - 54 59  Calc LDL mg/dL (calc) 88 - - 84 194(H)  Triglycerides <150 mg/dL 77 - - 106 130  Creatinine 0.50 - 0.99 mg/dL 1.15(H) 1.14(H) - 1.10(H) 1.04(H)   BP/Weight 05/01/2020 01/30/2020 09/12/2019 08/31/2019 07/05/2019 12/21/9756 07/03/2548  Systolic BP 826 415 830 940 768 088 110  Diastolic BP 90 94 76 84 86 88 90  Wt. (Lbs) 243 247 247.04 247 248 250 252.8  BMI 43.05 43.75 43.76 45.18 45.36 45.73 46.24   Foot/eye exam completion dates Latest Ref Rng & Units 09/12/2019 08/06/2019  Eye Exam No Retinopathy - No Retinopathy  Foot Form Completion - Done -

## 2020-05-01 NOTE — Patient Instructions (Addendum)
F/U in  Office with MD in 3.5 months, call if you need me sooner  Please get non fasting cmp and EGFr, HBA1C June 3 or after  It is important that you exercise regularly at least 30 minutes 5 times a week. If you develop chest pain, have severe difficulty breathing, or feel very tired, stop exercising immediately and seek medical attention   Blood pressure is too high, increase amlodipine to 10 mg one daily  Congrats on weight loss and regular exercise , keep both up!  Thanks for choosing [facility], we consider it a privelige to serve you.     

## 2020-05-01 NOTE — Assessment & Plan Note (Signed)
Controlled, no change in medication Hyperlipidemia:Low fat diet discussed and encouraged.   Lipid Panel  Lab Results  Component Value Date   CHOL 160 01/30/2020   HDL 55 01/30/2020   LDLCALC 88 01/30/2020   TRIG 77 01/30/2020   CHOLHDL 2.9 01/30/2020

## 2020-05-17 ENCOUNTER — Other Ambulatory Visit: Payer: Self-pay | Admitting: Family Medicine

## 2020-05-17 LAB — COMPLETE METABOLIC PANEL WITH GFR
AG Ratio: 1.4 (calc) (ref 1.0–2.5)
ALT: 28 U/L (ref 6–29)
AST: 18 U/L (ref 10–35)
Albumin: 4.4 g/dL (ref 3.6–5.1)
Alkaline phosphatase (APISO): 94 U/L (ref 37–153)
BUN/Creatinine Ratio: 19 (calc) (ref 6–22)
BUN: 20 mg/dL (ref 7–25)
CO2: 31 mmol/L (ref 20–32)
Calcium: 10.1 mg/dL (ref 8.6–10.4)
Chloride: 99 mmol/L (ref 98–110)
Creat: 1.07 mg/dL — ABNORMAL HIGH (ref 0.50–0.99)
GFR, Est African American: 63 mL/min/{1.73_m2} (ref >=60)
GFR, Est Non African American: 54 mL/min/{1.73_m2} — ABNORMAL LOW (ref >=60)
Globulin: 3.1 g/dL (calc) (ref 1.9–3.7)
Glucose, Bld: 154 mg/dL — ABNORMAL HIGH (ref 65–99)
Potassium: 3.4 mmol/L — ABNORMAL LOW (ref 3.5–5.3)
Sodium: 139 mmol/L (ref 135–146)
Total Bilirubin: 0.9 mg/dL (ref 0.2–1.2)
Total Protein: 7.5 g/dL (ref 6.1–8.1)

## 2020-05-17 LAB — HEMOGLOBIN A1C
Hgb A1c MFr Bld: 7.8 % of total Hgb — ABNORMAL HIGH (ref <5.7)
Mean Plasma Glucose: 177 (calc)
eAG (mmol/L): 9.8 (calc)

## 2020-05-17 MED ORDER — GLIPIZIDE ER 2.5 MG PO TB24: 2.5000 mg | ORAL_TABLET | Freq: Every day | ORAL | 1 refills | Status: DC

## 2020-05-23 ENCOUNTER — Other Ambulatory Visit: Payer: Self-pay

## 2020-05-23 MED ORDER — GLIPIZIDE ER 2.5 MG PO TB24: 2.5000 mg | ORAL_TABLET | Freq: Every day | ORAL | 1 refills | Status: DC

## 2020-05-31 MED FILL — TRIAMTERENE-HCTZ 75-50 MG T: 75-50 | 30 days supply | Qty: 30 | Fill #2

## 2020-05-31 MED FILL — ROSUVASTATIN CALCIUM 40 MG: 40 | 30 days supply | Qty: 30 | Fill #2

## 2020-05-31 MED FILL — cloNIDine HCL 0.3 MG TABS: 0.3 | 30 days supply | Qty: 30 | Fill #1

## 2020-05-31 MED FILL — METOPROLOL SUCCINATE ER 25: 25 | 30 days supply | Qty: 30 | Fill #2

## 2020-06-25 ENCOUNTER — Other Ambulatory Visit: Payer: Self-pay | Admitting: *Deleted

## 2020-06-25 MED ORDER — ROSUVASTATIN CALCIUM 40 MG PO TABS: 40.0000 mg | ORAL_TABLET | Freq: Every day | ORAL | 5 refills | Status: DC

## 2020-06-25 MED FILL — ROSUVASTATIN CALCIUM 40 MG: 40 | 30 days supply | Qty: 30 | Fill #0

## 2020-07-03 ENCOUNTER — Other Ambulatory Visit: Payer: Self-pay | Admitting: Family Medicine

## 2020-07-03 MED FILL — ROSUVASTATIN CALCIUM 40 MG: 40 | 30 days supply | Qty: 30 | Fill #3

## 2020-07-03 MED FILL — METOPROLOL SUCCINATE ER 25: 25 | 30 days supply | Qty: 30 | Fill #3

## 2020-07-03 MED FILL — cloNIDine HCL 0.3 MG TABS: 0.3 | 30 days supply | Qty: 30 | Fill #2

## 2020-07-03 MED FILL — TRIAMTERENE-HCTZ 75-50 MG T: 75-50 | 30 days supply | Qty: 30 | Fill #3

## 2020-07-03 MED FILL — ACCU-CHEK GUIDE TEST STRIP: 50 days supply | Qty: 50 | Fill #0

## 2020-07-31 ENCOUNTER — Other Ambulatory Visit: Payer: Self-pay | Admitting: *Deleted

## 2020-07-31 MED ORDER — ROSUVASTATIN CALCIUM 40 MG PO TABS: 40.0000 mg | ORAL_TABLET | Freq: Every day | ORAL | 2 refills | Status: DC

## 2020-07-31 MED FILL — ROSUVASTATIN CALCIUM 40 MG: 40 | 30 days supply | Qty: 30 | Fill #0

## 2020-07-31 MED FILL — cloNIDine HCL 0.3 MG TABS: 0.3 | 30 days supply | Qty: 30 | Fill #3

## 2020-07-31 MED FILL — METOPROLOL SUCCINATE ER 25: 25 | 30 days supply | Qty: 30 | Fill #4

## 2020-07-31 MED FILL — TRIAMTERENE-HCTZ 75-50 MG T: 75-50 | 30 days supply | Qty: 30 | Fill #4

## 2020-07-31 MED FILL — AMLODIPINE BESYLATE 10 MG T: 10 | 30 days supply | Qty: 30 | Fill #1

## 2020-08-21 ENCOUNTER — Ambulatory Visit (INDEPENDENT_AMBULATORY_CARE_PROVIDER_SITE_OTHER): Payer: Medicare Other | Admitting: Family Medicine

## 2020-08-21 ENCOUNTER — Other Ambulatory Visit: Payer: Self-pay

## 2020-08-21 ENCOUNTER — Encounter: Payer: Self-pay | Admitting: Family Medicine

## 2020-08-21 VITALS — BP 136/82 | HR 86 | Resp 16 | Ht 63.0 in | Wt 244.0 lb

## 2020-08-21 DIAGNOSIS — K219 Gastro-esophageal reflux disease without esophagitis: Secondary | ICD-10-CM

## 2020-08-21 DIAGNOSIS — Z23 Encounter for immunization: Secondary | ICD-10-CM | POA: Diagnosis not present

## 2020-08-21 DIAGNOSIS — E7849 Other hyperlipidemia: Secondary | ICD-10-CM

## 2020-08-21 DIAGNOSIS — E1159 Type 2 diabetes mellitus with other circulatory complications: Secondary | ICD-10-CM | POA: Diagnosis not present

## 2020-08-21 DIAGNOSIS — I10 Essential (primary) hypertension: Secondary | ICD-10-CM | POA: Diagnosis not present

## 2020-08-21 DIAGNOSIS — E559 Vitamin D deficiency, unspecified: Secondary | ICD-10-CM

## 2020-08-21 LAB — POCT GLYCOSYLATED HEMOGLOBIN (HGB A1C): Hemoglobin A1C: 7.9 % — AB (ref 4.0–5.6)

## 2020-08-21 MED ORDER — GLIPIZIDE ER 2.5 MG PO TB24: 2.5000 mg | ORAL_TABLET | Freq: Every day | ORAL | 5 refills | Status: DC

## 2020-08-21 MED ORDER — GLIPIZIDE ER 2.5 MG PO TB24: 2.5000 mg | ORAL_TABLET | Freq: Every day | ORAL | 1 refills | Status: DC

## 2020-08-21 NOTE — Patient Instructions (Addendum)
Follow-up in office with MD in 12 weeks call if you need me sooner. New is glipizide 2.82m daily in addition to the glipizide 158m   Flu vaccine today.  Glycohemoglobin today.  Fasting lipids CMP and EGFR TSH vitamin D CBC and microalbuminuria 1 week prior to 3-64-monthllow-up visit You are currently taking glipizide 10 mg and Metformin.  As we discussed if your HbA1c is 7.5 or more you will get an additional 2.5 mg glipizide tablet.  Nurse will verify this and discuss with you prior to leaving today.  Blood pressure is good no medication change. It is important that you exercise regularly at least 30 minutes 5 times a week. If you develop chest pain, have severe difficulty breathing, or feel very tired, stop exercising immediately and seek medical attention   Think about what you will eat, plan ahead. Choose " clean, green, fresh or frozen" over canned, processed or packaged foods which are more sugary, salty and fatty. 70 to 75% of food eaten should be vegetables and fruit. Three meals at set times with snacks allowed between meals, but they must be fruit or vegetables. Aim to eat over a 12 hour period , example 7 am to 7 pm, and STOP after  your last meal of the day. Drink water,generally about 64 ounces per day, no other drink is as healthy. Fruit juice is best enjoyed in a healthy way, by EATING the fruit.

## 2020-08-25 ENCOUNTER — Encounter: Payer: Self-pay | Admitting: Family Medicine

## 2020-08-25 NOTE — Assessment & Plan Note (Signed)
Controlled, no change in medication DASH diet and commitment to daily physical activity for a minimum of 30 minutes discussed and encouraged, as a part of hypertension management. The importance of attaining a healthy weight is also discussed.  BP/Weight 08/21/2020 05/01/2020 01/30/2020 09/12/2019 08/31/2019 07/05/2019 03/29/2019  Systolic BP 136 160 150 122 134 130 130  Diastolic BP 82 90 94 76 84 86 88  Wt. (Lbs) 244 243 247 247.04 247 248 250  BMI 43.22 43.05 43.75 43.76 45.18 45.36 45.73

## 2020-08-25 NOTE — Assessment & Plan Note (Signed)
Continue weekly supplement 

## 2020-08-25 NOTE — Assessment & Plan Note (Signed)
Not at goal, add glipizide 2.5 mg daily Tina Wilkins is reminded of the importance of commitment to daily physical activity for 30 minutes or more, as able and the need to limit carbohydrate intake to 30 to 60 grams per meal to help with blood sugar control.   The need to take medication as prescribed, test blood sugar as directed, and to call between visits if there is a concern that blood sugar is uncontrolled is also discussed.   Tina Wilkins is reminded of the importance of daily foot exam, annual eye examination, and good blood sugar, blood pressure and cholesterol control.  Diabetic Labs Latest Ref Rng & Units 08/21/2020 05/16/2020 01/30/2020 10/17/2019 09/09/2019  HbA1c 4.0 - 5.6 % 7.9(A) 7.8(H) 8.2(H) 8.0(H) -  Microalbumin mg/dL - - - - 7.0  Micro/Creat Ratio <30 mcg/mg creat - - - - 50(H)  Chol <200 mg/dL - - 782 - -  HDL > OR = 50 mg/dL - - 55 - -  Calc LDL mg/dL (calc) - - 88 - -  Triglycerides <150 mg/dL - - 77 - -  Creatinine 0.50 - 0.99 mg/dL - 4.23(N) 3.61(W) 4.31(V) -   BP/Weight 08/21/2020 05/01/2020 01/30/2020 09/12/2019 08/31/2019 07/05/2019 03/29/2019  Systolic BP 136 160 150 122 134 130 130  Diastolic BP 82 90 94 76 84 86 88  Wt. (Lbs) 244 243 247 247.04 247 248 250  BMI 43.22 43.05 43.75 43.76 45.18 45.36 45.73   Foot/eye exam completion dates Latest Ref Rng & Units 09/12/2019 08/06/2019  Eye Exam No Retinopathy - No Retinopathy  Foot Form Completion - Done -

## 2020-08-25 NOTE — Assessment & Plan Note (Signed)
Controlled, no change in medication  

## 2020-08-25 NOTE — Assessment & Plan Note (Signed)
Hyperlipidemia:Low fat diet discussed and encouraged.   Lipid Panel  Lab Results  Component Value Date   CHOL 160 01/30/2020   HDL 55 01/30/2020   LDLCALC 88 01/30/2020   TRIG 77 01/30/2020   CHOLHDL 2.9 01/30/2020     Controlled, no change in medication Updated lab needed at/ before next visit.

## 2020-08-25 NOTE — Assessment & Plan Note (Signed)
  Patient re-educated about  the importance of commitment to a  minimum of 150 minutes of exercise per week as able.  The importance of healthy food choices with portion control discussed, as well as eating regularly and within a 12 hour window most days. The need to choose "clean , green" food 50 to 75% of the time is discussed, as well as to make water the primary drink and set a goal of 64 ounces water daily.    Weight /BMI 08/21/2020 05/01/2020 01/30/2020  WEIGHT 244 lb 243 lb 247 lb  HEIGHT 5\' 3"  5\' 3"  5\' 3"   BMI 43.22 kg/m2 43.05 kg/m2 43.75 kg/m2

## 2020-08-25 NOTE — Progress Notes (Signed)
Tina Wilkins     MRN: [id number]      DOB: 05-Sep-1956   HPI Tina Wilkins is here for follow up and re-evaluation of chronic medical conditions, medication management and review of any available recent lab and radiology data.  Preventive health is updated, specifically  Cancer screening and Immunization.   Questions or concerns regarding consultations or procedures which the PT has had in the interim are  addressed. The PT denies any adverse reactions to current medications since the last visit.  There are no new concerns.  There are no specific complaints  Denies polyuria, polydipsia, blurred vision , or hypoglycemic episodes.   ROS Denies recent fever or chills. Denies sinus pressure, nasal congestion, ear pain or sore throat. Denies chest congestion, productive cough or wheezing. Denies chest pains, palpitations and leg swelling Denies abdominal pain, nausea, vomiting,diarrhea or constipation.   Denies dysuria, frequency, hesitancy or incontinence. Denies joint pain, swelling and limitation in mobility. Denies headaches, seizures, numbness, or tingling. Denies depression, anxiety or insomnia. Denies skin break down or rash.   PE  BP 136/82   Pulse 86   Resp 16   Ht 5\' 3"  (1.6 m)   Wt 244 lb (110.7 kg)   SpO2 97%   BMI 43.22 kg/m   Patient alert and oriented and in no cardiopulmonary distress.  HEENT: No facial asymmetry, EOMI,     Neck supple .  Chest: Clear to auscultation bilaterally.  CVS: S1, S2 no murmurs, no S3.Regular rate.  ABD: Soft non tender.   Ext: No edema  MS: Adequate ROM spine, shoulders, hips and knees.  Skin: Intact, no ulcerations or rash noted.  Psych: Good eye contact, normal affect. Memory intact not anxious or depressed appearing.  CNS: CN 2-12 intact, power,  normal throughout.no focal deficits noted.   Assessment & Plan  Hypertension goal BP (blood pressure) < 130/80 Controlled, no change in medication DASH diet and  commitment to daily physical activity for a minimum of 30 minutes discussed and encouraged, as a part of hypertension management. The importance of attaining a healthy weight is also discussed.  BP/Weight 08/21/2020 05/01/2020 01/30/2020 09/12/2019 08/31/2019 07/05/2019 03/29/2019  Systolic BP 136 160 150 122 134 130 130  Diastolic BP 82 90 94 76 84 86 88  Wt. (Lbs) 244 243 247 247.04 247 248 250  BMI 43.22 43.05 43.75 43.76 45.18 45.36 45.73       Hyperlipidemia Hyperlipidemia:Low fat diet discussed and encouraged.   Lipid Panel  Lab Results  Component Value Date   CHOL 160 01/30/2020   HDL 55 01/30/2020   LDLCALC 88 01/30/2020   TRIG 77 01/30/2020   CHOLHDL 2.9 01/30/2020     Controlled, no change in medication Updated lab needed at/ before next visit.   Type 2 diabetes mellitus with vascular disease ([facility]) Not at goal, add glipizide 2.5 mg daily Tina Wilkins is reminded of the importance of commitment to daily physical activity for 30 minutes or more, as able and the need to limit carbohydrate intake to 30 to 60 grams per meal to help with blood sugar control.   The need to take medication as prescribed, test blood sugar as directed, and to call between visits if there is a concern that blood sugar is uncontrolled is also discussed.   Tina Wilkins is reminded of the importance of daily foot exam, annual eye examination, and good blood sugar, blood pressure and cholesterol control.  Diabetic Labs Latest Ref Rng & Units  08/21/2020 05/16/2020 01/30/2020 10/17/2019 09/09/2019  HbA1c 4.0 - 5.6 % 7.9(A) 7.8(H) 8.2(H) 8.0(H) -  Microalbumin mg/dL - - - - 7.0  Micro/Creat Ratio <30 mcg/mg creat - - - - 50(H)  Chol <200 mg/dL - - 161 - -  HDL > OR = 50 mg/dL - - 55 - -  Calc LDL mg/dL (calc) - - 88 - -  Triglycerides <150 mg/dL - - 77 - -  Creatinine 0.50 - 0.99 mg/dL - 0.96(E) 4.54(U) 9.81(X) -   BP/Weight 08/21/2020 05/01/2020 01/30/2020 09/12/2019 08/31/2019 07/05/2019 03/29/2019  Systolic BP 136  914 150 122 134 130 130  Diastolic BP 82 90 94 76 84 86 88  Wt. (Lbs) 244 243 247 247.04 247 248 250  BMI 43.22 43.05 43.75 43.76 45.18 45.36 45.73   Foot/eye exam completion dates Latest Ref Rng & Units 09/12/2019 08/06/2019  Eye Exam No Retinopathy - No Retinopathy  Foot Form Completion - Done -        Morbid obesity  Patient re-educated about  the importance of commitment to a  minimum of 150 minutes of exercise per week as able.  The importance of healthy food choices with portion control discussed, as well as eating regularly and within a 12 hour window most days. The need to choose "clean , green" food 50 to 75% of the time is discussed, as well as to make water the primary drink and set a goal of 64 ounces water daily.    Weight /BMI 08/21/2020 05/01/2020 01/30/2020  WEIGHT 244 lb 243 lb 247 lb  HEIGHT 5\' 3"  5\' 3"  5\' 3"   BMI 43.22 kg/m2 43.05 kg/m2 43.75 kg/m2      Chronic GERD Controlled, no change in medication   Vitamin D deficiency Continue weekly supplement

## 2020-08-30 MED FILL — VIT D2 1.25 MG (50,000 UNIT: 1.25 MG | 28 days supply | Qty: 4 | Fill #1

## 2020-08-30 MED FILL — METOPROLOL SUCCINATE ER 25: 25 | 30 days supply | Qty: 30 | Fill #5

## 2020-08-30 MED FILL — ROSUVASTATIN CALCIUM 40 MG: 40 | 30 days supply | Qty: 30 | Fill #1

## 2020-08-30 MED FILL — TRIAMTERENE-HCTZ 75-50 MG T: 75-50 | 30 days supply | Qty: 30 | Fill #5

## 2020-08-30 MED FILL — cloNIDine HCL 0.3 MG TABS: 0.3 | 30 days supply | Qty: 30 | Fill #4

## 2020-08-31 ENCOUNTER — Other Ambulatory Visit: Payer: Self-pay

## 2020-08-31 ENCOUNTER — Ambulatory Visit (INDEPENDENT_AMBULATORY_CARE_PROVIDER_SITE_OTHER): Payer: Medicare Other | Admitting: Family Medicine

## 2020-08-31 ENCOUNTER — Encounter: Payer: Self-pay | Admitting: Family Medicine

## 2020-08-31 VITALS — BP 133/79 | HR 64 | Resp 18 | Ht 63.0 in | Wt 246.0 lb

## 2020-08-31 DIAGNOSIS — Z0000 Encounter for general adult medical examination without abnormal findings: Secondary | ICD-10-CM

## 2020-08-31 NOTE — Patient Instructions (Signed)
Tina Wilkins , Thank you for taking time to come for your Medicare Wellness Visit. I appreciate your ongoing commitment to your health goals. Please review the following plan we discussed and let me know if I can assist you in the future.   Please continue to practice social distancing to keep you, your family, and our community safe.  If you must go out, please wear a Mask and practice good handwashing.  Screening recommendations/referrals: Colonoscopy: up to date Mammogram: up to date Bone Density: up to date Recommended yearly ophthalmology/optometry visit for glaucoma screening and checkup Recommended yearly dental visit for hygiene and checkup  Vaccinations: Influenza vaccine: up to dat Pneumococcal vaccine: up to date Tdap vaccine: up to date Shingles vaccine: no received   Advanced directives: Completed, bring copy when you can  Conditions/risks identified: Falls   Next appointment: 11/15/2020  Preventive Care 65 Years and Older, Female Preventive care refers to lifestyle choices and visits with your health care provider that can promote health and wellness. What does preventive care include?  A yearly physical exam. This is also called an annual well check.  Dental exams once or twice a year.  Routine eye exams. Ask your health care provider how often you should have your eyes checked.  Personal lifestyle choices, including:  Daily care of your teeth and gums.  Regular physical activity.  Eating a healthy diet.  Avoiding tobacco and drug use.  Limiting alcohol use.  Practicing safe sex.  Taking low-dose aspirin every day.  Taking vitamin and mineral supplements as recommended by your health care provider. What happens during an annual well check? The services and screenings done by your health care provider during your annual well check will depend on your age, overall health, lifestyle risk factors, and family history of disease. Counseling  Your health  care provider may ask you questions about your:  Alcohol use.  Tobacco use.  Drug use.  Emotional well-being.  Home and relationship well-being.  Sexual activity.  Eating habits.  History of falls.  Memory and ability to understand (cognition).  Work and work Astronomer.  Reproductive health. Screening  You may have the following tests or measurements:  Height, weight, and BMI.  Blood pressure.  Lipid and cholesterol levels. These may be checked every 5 years, or more frequently if you are over 36 years old.  Skin check.  Lung cancer screening. You may have this screening every year starting at age 22 if you have a 30-pack-year history of smoking and currently smoke or have quit within the past 15 years.  Fecal occult blood test (FOBT) of the stool. You may have this test every year starting at age 85.  Flexible sigmoidoscopy or colonoscopy. You may have a sigmoidoscopy every 5 years or a colonoscopy every 10 years starting at age 92.  Hepatitis C blood test.  Hepatitis B blood test.  Sexually transmitted disease (STD) testing.  Diabetes screening. This is done by checking your blood sugar (glucose) after you have not eaten for a while (fasting). You may have this done every 1-3 years.  Bone density scan. This is done to screen for osteoporosis. You may have this done starting at age 80.  Mammogram. This may be done every 1-2 years. Talk to your health care provider about how often you should have regular mammograms. Talk with your health care provider about your test results, treatment options, and if necessary, the need for more tests. Vaccines  Your health care provider may recommend  certain vaccines, such as:  Influenza vaccine. This is recommended every year.  Tetanus, diphtheria, and acellular pertussis (Tdap, Td) vaccine. You may need a Td booster every 10 years.  Zoster vaccine. You may need this after age 66.  Pneumococcal 13-valent conjugate  (PCV13) vaccine. One dose is recommended after age 57.  Pneumococcal polysaccharide (PPSV23) vaccine. One dose is recommended after age 69. Talk to your health care provider about which screenings and vaccines you need and how often you need them. This information is not intended to replace advice given to you by your health care provider. Make sure you discuss any questions you have with your health care provider. Document Released: 12/14/2015 Document Revised: 08/06/2016 Document Reviewed: 09/18/2015 Elsevier Interactive Patient Education  2017 [employer].  Fall Prevention in the Home Falls can cause injuries. They can happen to people of all ages. There are many things you can do to make your home safe and to help prevent falls. What can I do on the outside of my home?  Regularly fix the edges of walkways and driveways and fix any cracks.  Remove anything that might make you trip as you walk through a door, such as a raised step or threshold.  Trim any bushes or trees on the path to your home.  Use bright outdoor lighting.  Clear any walking paths of anything that might make someone trip, such as rocks or tools.  Regularly check to see if handrails are loose or broken. Make sure that both sides of any steps have handrails.  Any raised decks and porches should have guardrails on the edges.  Have any leaves, snow, or ice cleared regularly.  Use sand or salt on walking paths during winter.  Clean up any spills in your garage right away. This includes oil or grease spills. What can I do in the bathroom?  Use night lights.  Install grab bars by the toilet and in the tub and shower. Do not use towel bars as grab bars.  Use non-skid mats or decals in the tub or shower.  If you need to sit down in the shower, use a plastic, non-slip stool.  Keep the floor dry. Clean up any water that spills on the floor as soon as it happens.  Remove soap buildup in the tub or shower  regularly.  Attach bath mats securely with double-sided non-slip rug tape.  Do not have throw rugs and other things on the floor that can make you trip. What can I do in the bedroom?  Use night lights.  Make sure that you have a light by your bed that is easy to reach.  Do not use any sheets or blankets that are too big for your bed. They should not hang down onto the floor.  Have a firm chair that has side arms. You can use this for support while you get dressed.  Do not have throw rugs and other things on the floor that can make you trip. What can I do in the kitchen?  Clean up any spills right away.  Avoid walking on wet floors.  Keep items that you use a lot in easy-to-reach places.  If you need to reach something above you, use a strong step stool that has a grab bar.  Keep electrical cords out of the way.  Do not use floor polish or wax that makes floors slippery. If you must use wax, use non-skid floor wax.  Do not have throw rugs and other  things on the floor that can make you trip. What can I do with my stairs?  Do not leave any items on the stairs.  Make sure that there are handrails on both sides of the stairs and use them. Fix handrails that are broken or loose. Make sure that handrails are as long as the stairways.  Check any carpeting to make sure that it is firmly attached to the stairs. Fix any carpet that is loose or worn.  Avoid having throw rugs at the top or bottom of the stairs. If you do have throw rugs, attach them to the floor with carpet tape.  Make sure that you have a light switch at the top of the stairs and the bottom of the stairs. If you do not have them, ask someone to add them for you. What else can I do to help prevent falls?  Wear shoes that:  Do not have high heels.  Have rubber bottoms.  Are comfortable and fit you well.  Are closed at the toe. Do not wear sandals.  If you use a stepladder:  Make sure that it is fully  opened. Do not climb a closed stepladder.  Make sure that both sides of the stepladder are locked into place.  Ask someone to hold it for you, if possible.  Clearly mark and make sure that you can see:  Any grab bars or handrails.  First and last steps.  Where the edge of each step is.  Use tools that help you move around (mobility aids) if they are needed. These include:  Canes.  Walkers.  Scooters.  Crutches.  Turn on the lights when you go into a dark area. Replace any light bulbs as soon as they burn out.  Set up your furniture so you have a clear path. Avoid moving your furniture around.  If any of your floors are uneven, fix them.  If there are any pets around you, be aware of where they are.  Review your medicines with your doctor. Some medicines can make you feel dizzy. This can increase your chance of falling. Ask your doctor what other things that you can do to help prevent falls. This information is not intended to replace advice given to you by your health care provider. Make sure you discuss any questions you have with your health care provider. Document Released: 09/13/2009 Document Revised: 04/24/2016 Document Reviewed: 12/22/2014 Elsevier Interactive Patient Education  2017 [employer].

## 2020-08-31 NOTE — Progress Notes (Signed)
Subjective:   Tina Wilkins is a 64 y.o. female who presents for Medicare Annual (Subsequent) preventive examination.  Review of Systems     Cardiac Risk Factors include: none     Objective:    Today's Vitals   08/31/20 0808 08/31/20 0809  BP: 133/79   Pulse: 64   Resp: 18   SpO2: 98%   Weight: 246 lb (111.6 kg)   Height: _0  (1.6 m)   PainSc: 0-No pain 0-No pain   Body mass index is 43.58 kg/m.  Advanced Directives 08/31/2020 08/31/2019 04/01/2017 07/25/2015  Does Patient Have a Medical Advance Directive? Yes No Yes No;Yes  Type of Paramedic of [place];Living will - [place];Living will -  Does patient want to make changes to medical advance directive? Yes (MAU/Ambulatory/Procedural Areas - Information given) - Yes (MAU/Ambulatory/Procedural Areas - Information given) No - Patient declined  Copy of [place] in Chart? No - copy requested - No - copy requested No - copy requested  Would patient like information on creating a medical advance directive? - - - No - patient declined information    Current Medications (verified) Outpatient Encounter Medications as of 08/31/2020  Medication Sig  . Accu-Chek FastClix Lancets MISC USE AS DIRECTED TO CHECK BLOOD SUGAR DAILY  . ACCU-CHEK GUIDE test strip USE AS DIRECTED TO CHECK BLOOD SUGAR DAILY  . acetaminophen (TYLENOL) 500 MG tablet Take 500 mg by mouth every 6 (six) hours as needed.  . Alcohol Swabs (ALCOHOL PREPS) PADS Use as directed when testing blood glucose  . amLODipine (NORVASC) 10 MG tablet Take 1 tablet (10 mg total) by mouth daily.  Marland Kitchen aspirin EC 81 MG tablet Take 81 mg by mouth daily.  . blood glucose meter kit and supplies Accuchek meter (per patient request) once daily testing  DX e11.9  . calcium-vitamin D (OSCAL WITH D) 500-200 MG-UNIT per tablet Take 1 tablet by mouth 2 (two) times daily.  . cloNIDine (CATAPRES) 0.3 MG tablet Take 1 tablet (0.3  mg total) by mouth at bedtime.  Marland Kitchen glipiZIDE (GLUCOTROL XL) 10 MG 24 hr tablet Take 1 tablet (10 mg total) by mouth daily with breakfast.  . glipiZIDE (GLUCOTROL XL) 2.5 MG 24 hr tablet Take 1 tablet (2.5 mg total) by mouth daily with breakfast.  . LUMIGAN 0.01 % SOLN Place 1 drop into both eyes daily.  . metFORMIN (GLUCOPHAGE) 1000 MG tablet TAKE 1 TABLET BY MOUTH 2 TIMES DAILY WITH A MEAL.  . metoprolol succinate (TOPROL-XL) 25 MG 24 hr tablet Take 1 tablet (25 mg total) by mouth daily.  . montelukast (SINGULAIR) 10 MG tablet Take 1 tablet (10 mg total) by mouth at bedtime.  . pantoprazole (PROTONIX) 40 MG tablet Take 1 tablet (40 mg total) by mouth daily.  . RESTASIS 0.05 % ophthalmic emulsion   . rosuvastatin (CRESTOR) 40 MG tablet Take 1 tablet (40 mg total) by mouth daily.  Marland Kitchen triamterene-hydrochlorothiazide (MAXZIDE) 75-50 MG tablet Take 1 tablet by mouth daily.  . Vitamin D, Ergocalciferol, (DRISDOL) 1.25 MG (50000 UNIT) CAPS capsule TAKE 1 CAPSULE (50,000 UNITS) BY MOUTH ONCE A WEEK.  . [DISCONTINUED] glipiZIDE (GLIPIZIDE XL) 2.5 MG 24 hr tablet Take 1 tablet (2.5 mg total) by mouth daily with breakfast.   No facility-administered encounter medications on file as of 08/31/2020.    Allergies (verified) Celecoxib, Nolamine [chlorphen-phenind-phenylprop], Sitagliptin-metformin hcl, and Skelaxin   History: Past Medical History:  Diagnosis Date  .  Essential hypertension 2005  . Hyperlipidemia 2005  . Obesity   . Seasonal allergies   . Type 2 diabetes mellitus ([place]) 2010   Past Surgical History:  Procedure Laterality Date  . ABDOMINAL HYSTERECTOMY  2004   Secondary to fibroids, single ovary left  . [place]   2 vaginal deliveries before  . SHOULDER SURGERY Left    Family History  Problem Relation Age of Onset  . Heart disease Mother   . Stroke Mother   . Heart disease Father   . Stroke Father   . Diabetes Father   . Diabetes Sister   . Heart disease Sister     . Lymphoma Sister   . Cancer Brother   . Heart disease Brother   . Cancer Brother        unknown type  . Lung disease Brother   . Heart disease Brother   . Liver cancer Brother   . Lung cancer Brother   . Liver cancer Brother   . Lung cancer Brother   . Seizures Son    Social History   Socioeconomic History  . Marital status: Married    Spouse name: Not on file  . Number of children: Not on file  . Years of education: Not on file  . Highest education level: Not on file  Occupational History  . Not on file  Tobacco Use  . Smoking status: Never Smoker  . Smokeless tobacco: Never Used  Vaping Use  . Vaping Use: Never used  Substance and Sexual Activity  . Alcohol use: No    Alcohol/week: 0.0 standard drinks  . Drug use: No  . Sexual activity: Yes    Birth control/protection: Surgical  Other Topics Concern  . Not on file  Social History Narrative  . Not on file   Social Determinants of Health   Financial Resource Strain: Low Risk   . Difficulty of Paying Living Expenses: Not hard at all  Food Insecurity: No Food Insecurity  . Worried About Charity fundraiser in the Last Year: Never true  . Ran Out of Food in the Last Year: Never true  Transportation Needs: No Transportation Needs  . Lack of Transportation (Medical): No  . Lack of Transportation (Non-Medical): No  Physical Activity: Sufficiently Active  . Days of Exercise per Week: 3 days  . Minutes of Exercise per Session: 60 min  Stress: No Stress Concern Present  . Feeling of Stress : Not at all  Social Connections: Socially Integrated  . Frequency of Communication with Friends and Family: More than three times a week  . Frequency of Social Gatherings with Friends and Family: More than three times a week  . Attends Religious Services: More than 4 times per year  . Active Member of Clubs or Organizations: Yes  . Attends Archivist Meetings: More than 4 times per year  . Marital Status: Married     Tobacco Counseling Counseling given: Not Answered   Clinical Intake:  Pre-visit preparation completed: Yes  Pain : No/denies pain Pain Score: 0-No pain     BMI - recorded: 43.23 Nutritional Status: BMI > 30  Obese Nutritional Risks: None Diabetes: Yes CBG done?: No Did pt. bring in CBG monitor from home?: No  How often do you need to have someone help you when you read instructions, pamphlets, or other written materials from your doctor or pharmacy?: 1 - Never What is the last grade level you completed in school?:  HS graduate  Diabetic? yes  Interpreter Needed?: No      Activities of Daily Living In your present state of health, do you have any difficulty performing the following activities: 08/31/2020  Hearing? N  Vision? N  Difficulty concentrating or making decisions? N  Walking or climbing stairs? N  Dressing or bathing? N  Doing errands, shopping? N  Preparing Food and eating ? N  Using the Toilet? N  In the past six months, have you accidently leaked urine? N  Do you have problems with loss of bowel control? N  Managing your Medications? N  Managing your Finances? N  Housekeeping or managing your Housekeeping? N  Some recent data might be hidden    Patient Care Team: Fayrene Helper, MD as PCP - General Carol Ada, MD as Consulting Physician (Gastroenterology) Marylynn Pearson, MD as Consulting Physician (Ophthalmology) Carole Civil, MD as Consulting Physician (Orthopedic Surgery)  Indicate any recent Medical Services you may have received from other than [facility] providers in the past year (date may be approximate).     Assessment:   This is a routine wellness examination for Tina Wilkins.  Hearing/Vision screen No exam data present  Dietary issues and exercise activities discussed: Current Exercise Habits: Home exercise routine, Type of exercise: walking, Time (Minutes): 60, Frequency (Times/Week): 3, Weekly Exercise (Minutes/Week): 180,  Intensity: Mild, Exercise limited by: None identified  Goals    . Exercise 3x per week (30 min per time)     Obese, recommend increasing your exercise program to 30-45 minutes at least 3 days a week as tolerated.      . Weight (lb) < 200 lb (90.7 kg)     Pt stated she would like to lose 20lbs.       Depression Screen PHQ 2/9 Scores 08/31/2020 08/31/2020 01/30/2020 09/12/2019 08/31/2019 08/31/2019 07/05/2019  PHQ - 2 Score 0 0 0 0 0 0 0  PHQ- 9 Score - - - - - - -    Fall Risk Fall Risk  08/31/2020 08/21/2020 05/01/2020 01/30/2020 09/12/2019  Falls in the past year? 0 0 0 0 0  Number falls in past yr: 0 0 0 0 0  Injury with Fall? 0 0 0 0 0  Follow up Falls evaluation completed - - - -    Any stairs in or around the home? Yes  If so, are there any without handrails? No  Home free of loose throw rugs in walkways, pet beds, electrical cords, etc? Yes  Adequate lighting in your home to reduce risk of falls? Yes   ASSISTIVE DEVICES UTILIZED TO PREVENT FALLS:  Life alert? No  Use of a cane, walker or w/c? No  Grab bars in the bathroom? Yes  Shower chair or bench in shower? No  Elevated toilet seat or a handicapped toilet? No   TIMED UP AND GO:  Was the test performed? No .  Length of time to ambulate 10 feet: na sec.     Cognitive Function:     6CIT Screen 08/31/2020 08/31/2019 04/01/2017  What Year? 0 points 0 points 0 points  What month? 0 points 0 points 0 points  What time? 0 points 0 points 0 points  Count back from 20 0 points 0 points 0 points  Months in reverse 0 points 0 points 0 points  Repeat phrase 0 points 0 points 0 points  Total Score 0 0 0    Immunizations Immunization History  Administered Date(s) Administered  . Fluad  Quad(high Dose 65+) 08/31/2019, 08/21/2020  . Influenza Split 09/11/2014  . Influenza Whole 10/06/2006, 08/22/2009  . Influenza,inj,Quad PF,6+ Mos 07/28/2018  . Moderna SARS-COVID-2 Vaccination 01/08/2020, 02/08/2020  . Pneumococcal  Conjugate-13 02/14/2015  . Pneumococcal Polysaccharide-23 09/22/2013, 09/12/2019  . Tdap 01/26/2013  . Zoster 10/18/2014  . Zoster Recombinat (Shingrix) 04/06/2019, 06/28/2019    TDAP status: Up to date Flu Vaccine status: Up to date Pneumococcal vaccine status: Up to date Covid-19 vaccine status: Completed vaccines  Qualifies for Shingles Vaccine? Yes   Zostavax completed No   Shingrix Completed?: No.    Education has been provided regarding the importance of this vaccine. Patient has been advised to call insurance company to determine out of pocket expense if they have not yet received this vaccine. Advised may also receive vaccine at local pharmacy or Health Dept. Verbalized acceptance and understanding.  Screening Tests Health Maintenance  Topic Date Due  . FOOT EXAM  09/11/2020  . HEMOGLOBIN A1C  02/18/2021  . OPHTHALMOLOGY EXAM  07/15/2021  . MAMMOGRAM  12/18/2021  . TETANUS/TDAP  01/26/2023  . COLONOSCOPY  08/01/2025  . INFLUENZA VACCINE  Completed  . DEXA SCAN  Completed  . COVID-19 Vaccine  Completed  . Hepatitis C Screening  Completed  . PNA vac Low Risk Adult  Completed    Health Maintenance  There are no preventive care reminders to display for this patient.  Colorectal cancer screening: Completed 08/02/15. Repeat every 10 years Mammogram status: Completed 12/19/19. Repeat every year Bone Density status: Completed 12/19/19. Results reflect: Bone density results: NORMAL. Repeat every 5 years.  Lung Cancer Screening: (Low Dose CT Chest recommended if Age 46-80 years, 30 pack-year currently smoking OR have quit w/in 15years.) does not qualify.   Lung Cancer Screening Referral: no  Additional Screening:  Hepatitis C Screening: does not qualify; Completed   Vision Screening: Recommended annual ophthalmology exams for early detection of glaucoma and other disorders of the eye. Is the patient up to date with their annual eye exam?  Yes  Who is the provider or what  is the name of the office in which the patient attends annual eye exams? Dr. Donalda Ewings If pt is not established with a provider, would they like to be referred to a provider to establish care? No .   Dental Screening: Recommended annual dental exams for proper oral hygiene  Community Resource Referral / Chronic Care Management: CRR required this visit?  No   CCM required this visit?  No      Plan:      1. Encounter for Medicare annual wellness exam   I have personally reviewed and noted the following in the patient's chart:   . Medical and social history . Use of alcohol, tobacco or illicit drugs  . Current medications and supplements . Functional ability and status . Nutritional status . Physical activity . Advanced directives . List of other physicians . Hospitalizations, surgeries, and ER visits in previous 12 months . Vitals . Screenings to include cognitive, depression, and falls . Referrals and appointments  In addition, I have reviewed and discussed with patient certain preventive protocols, quality metrics, and best practice recommendations. A written personalized care plan for preventive services as well as general preventive health recommendations were provided to patient.     Perlie Mayo, NP   08/31/2020

## 2020-10-02 ENCOUNTER — Other Ambulatory Visit (HOSPITAL_BASED_OUTPATIENT_CLINIC_OR_DEPARTMENT_OTHER): Payer: Self-pay | Admitting: Ophthalmology

## 2020-10-02 MED FILL — ROSUVASTATIN CALCIUM 40 MG: 40 | 30 days supply | Qty: 30 | Fill #2

## 2020-10-02 MED FILL — TRIAMTERENE-HCTZ 75-50 MG T: 75-50 | 30 days supply | Qty: 30 | Fill #6

## 2020-10-02 MED FILL — RESTASIS 0.05% EYE EMULSION: 0.05 | 30 days supply | Qty: 60 | Fill #0

## 2020-10-02 MED FILL — METOPROLOL SUCCINATE ER 25: 25 | 30 days supply | Qty: 30 | Fill #6

## 2020-10-02 MED FILL — cloNIDine HCL 0.3 MG TABS: 0.3 | 30 days supply | Qty: 30 | Fill #5

## 2020-10-02 MED FILL — LUMIGAN 0.01% EYE DROPS: 0.01 | 25 days supply | Qty: 3 | Fill #0

## 2020-10-24 ENCOUNTER — Other Ambulatory Visit: Payer: Self-pay | Admitting: Cardiology

## 2020-10-24 ENCOUNTER — Other Ambulatory Visit: Payer: Self-pay

## 2020-10-24 MED ORDER — METOPROLOL SUCCINATE ER 25 MG PO TB24: 25.0000 mg | ORAL_TABLET | Freq: Every day | ORAL | 0 refills | Status: DC

## 2020-10-24 NOTE — Telephone Encounter (Signed)
Pt not seen in 3 1/2 years, refilled toprol with instructions to send to PCP next time.

## 2020-11-01 ENCOUNTER — Other Ambulatory Visit: Payer: Self-pay

## 2020-11-01 ENCOUNTER — Other Ambulatory Visit: Payer: Self-pay | Admitting: Family Medicine

## 2020-11-01 MED ORDER — CLONIDINE HCL 0.3 MG PO TABS: 0.3000 mg | ORAL_TABLET | Freq: Every day | ORAL | 0 refills | Status: DC

## 2020-11-01 MED FILL — TRIAMTERENE-HCTZ 75-50 MG T: 75-50 | 30 days supply | Qty: 30 | Fill #7

## 2020-11-01 MED FILL — ROSUVASTATIN CALCIUM 40 MG: 40 | 30 days supply | Qty: 30 | Fill #3

## 2020-11-01 MED FILL — METOPROLOL SUCCINATE ER 25: 25 | 30 days supply | Qty: 30 | Fill #0

## 2020-11-01 MED FILL — AMLODIPINE BESYLATE 10 MG T: 10 | 30 days supply | Qty: 30 | Fill #2

## 2020-11-01 MED FILL — cloNIDine HCL 0.3 MG TABS: 0.3 | 30 days supply | Qty: 30 | Fill #0

## 2020-11-14 ENCOUNTER — Other Ambulatory Visit: Payer: Self-pay | Admitting: Family Medicine

## 2020-11-15 ENCOUNTER — Ambulatory Visit (INDEPENDENT_AMBULATORY_CARE_PROVIDER_SITE_OTHER): Payer: Medicare Other | Admitting: Family Medicine

## 2020-11-15 ENCOUNTER — Encounter: Payer: Self-pay | Admitting: Family Medicine

## 2020-11-15 ENCOUNTER — Other Ambulatory Visit: Payer: Self-pay

## 2020-11-15 VITALS — BP 136/84 | HR 77 | Resp 16 | Ht 63.25 in | Wt 240.1 lb

## 2020-11-15 DIAGNOSIS — I10 Essential (primary) hypertension: Secondary | ICD-10-CM | POA: Diagnosis not present

## 2020-11-15 DIAGNOSIS — K219 Gastro-esophageal reflux disease without esophagitis: Secondary | ICD-10-CM

## 2020-11-15 DIAGNOSIS — E782 Mixed hyperlipidemia: Secondary | ICD-10-CM | POA: Diagnosis not present

## 2020-11-15 DIAGNOSIS — E1159 Type 2 diabetes mellitus with other circulatory complications: Secondary | ICD-10-CM

## 2020-11-15 LAB — COMPREHENSIVE METABOLIC PANEL
AG Ratio: 1.6 (calc) (ref 1.0–2.5)
ALT: 25 U/L (ref 6–29)
AST: 22 U/L (ref 10–35)
Albumin: 4.3 g/dL (ref 3.6–5.1)
Alkaline phosphatase (APISO): 92 U/L (ref 37–153)
BUN/Creatinine Ratio: 18 (calc) (ref 6–22)
BUN: 21 mg/dL (ref 7–25)
CO2: 32 mmol/L (ref 20–32)
Calcium: 9.9 mg/dL (ref 8.6–10.4)
Chloride: 98 mmol/L (ref 98–110)
Creat: 1.2 mg/dL — ABNORMAL HIGH (ref 0.50–0.99)
Globulin: 2.7 g/dL (calc) (ref 1.9–3.7)
Glucose, Bld: 144 mg/dL — ABNORMAL HIGH (ref 65–99)
Potassium: 3.5 mmol/L (ref 3.5–5.3)
Sodium: 140 mmol/L (ref 135–146)
Total Bilirubin: 0.6 mg/dL (ref 0.2–1.2)
Total Protein: 7 g/dL (ref 6.1–8.1)

## 2020-11-15 NOTE — Patient Instructions (Signed)
F/u in office with mD in may, call if you need me sooner  Blood pressure , cholesterol and labs are excellent  Blood sugar seems a bit high, we will soon find out. Continue regular execise and cut back on sweets and starchy foods, increase vegetables, drink water  Non fast hBa1C in lab week of December 27 please  Congrats on weight loss  Please test blood sugar daily  Thanks for choosing [facility], we consider it a privelige to serve you.  Good foot exam today  Best for 2022!

## 2020-11-16 ENCOUNTER — Other Ambulatory Visit: Payer: Self-pay | Admitting: Family Medicine

## 2020-11-16 LAB — CBC WITH DIFFERENTIAL/PLATELET
Absolute Monocytes: 459 cells/uL (ref 200–950)
Basophils Absolute: 59 cells/uL (ref 0–200)
Basophils Relative: 0.8 %
Eosinophils Absolute: 414 cells/uL (ref 15–500)
Eosinophils Relative: 5.6 %
HCT: 38.3 % (ref 35.0–45.0)
Hemoglobin: 12.5 g/dL (ref 11.7–15.5)
Lymphs Abs: 2427 cells/uL (ref 850–3900)
MCH: 27.4 pg (ref 27.0–33.0)
MCHC: 32.6 g/dL (ref 32.0–36.0)
MCV: 83.8 fL (ref 80.0–100.0)
MPV: 10.6 fL (ref 7.5–12.5)
Monocytes Relative: 6.2 %
Neutro Abs: 4040 cells/uL (ref 1500–7800)
Neutrophils Relative %: 54.6 %
Platelets: 426 10*3/uL — ABNORMAL HIGH (ref 140–400)
RBC: 4.57 10*6/uL (ref 3.80–5.10)
RDW: 14 % (ref 11.0–15.0)
Total Lymphocyte: 32.8 %
WBC: 7.4 10*3/uL (ref 3.8–10.8)

## 2020-11-16 LAB — COMPLETE METABOLIC PANEL WITH GFR
AG Ratio: 1.6 (calc) (ref 1.0–2.5)
ALT: 25 U/L (ref 6–29)
AST: 22 U/L (ref 10–35)
Albumin: 4.3 g/dL (ref 3.6–5.1)
Alkaline phosphatase (APISO): 92 U/L (ref 37–153)
BUN/Creatinine Ratio: 18 (calc) (ref 6–22)
BUN: 21 mg/dL (ref 7–25)
CO2: 32 mmol/L (ref 20–32)
Calcium: 9.9 mg/dL (ref 8.6–10.4)
Chloride: 98 mmol/L (ref 98–110)
Creat: 1.2 mg/dL — ABNORMAL HIGH (ref 0.50–0.99)
GFR, Est African American: 55 mL/min/{1.73_m2} — ABNORMAL LOW (ref >=60)
GFR, Est Non African American: 47 mL/min/{1.73_m2} — ABNORMAL LOW (ref >=60)
Globulin: 2.7 g/dL (calc) (ref 1.9–3.7)
Glucose, Bld: 144 mg/dL — ABNORMAL HIGH (ref 65–99)
Potassium: 3.5 mmol/L (ref 3.5–5.3)
Sodium: 140 mmol/L (ref 135–146)
Total Bilirubin: 0.6 mg/dL (ref 0.2–1.2)
Total Protein: 7 g/dL (ref 6.1–8.1)

## 2020-11-16 LAB — LIPID PANEL
Cholesterol: 150 mg/dL (ref <200)
HDL: 57 mg/dL (ref >=50)
LDL Cholesterol (Calc): 76 mg/dL (calc)
Non-HDL Cholesterol (Calc): 93 mg/dL (calc) (ref <130)
Total CHOL/HDL Ratio: 2.6 (calc) (ref <5.0)
Triglycerides: 89 mg/dL (ref <150)

## 2020-11-16 LAB — VITAMIN D 25 HYDROXY (VIT D DEFICIENCY, FRACTURES): Vit D, 25-Hydroxy: 58 ng/mL (ref 30–100)

## 2020-11-16 LAB — MICROALBUMIN / CREATININE URINE RATIO
Creatinine, Urine: 119 mg/dL (ref 20–275)
Microalb Creat Ratio: 39 mcg/mg creat — ABNORMAL HIGH (ref <30)
Microalb, Ur: 4.7 mg/dL

## 2020-11-16 LAB — TSH: TSH: 4.13 mIU/L (ref 0.40–4.50)

## 2020-11-17 ENCOUNTER — Encounter: Payer: Self-pay | Admitting: Family Medicine

## 2020-11-17 NOTE — Progress Notes (Signed)
Tina Wilkins     MRN: [id number]      DOB: [birth year]   HPI Tina Wilkins is here for follow up and re-evaluation of chronic medical conditions, medication management and review of any available recent lab and radiology data.  Preventive health is updated, specifically  Cancer screening and Immunization.   Questions or concerns regarding consultations or procedures which the PT has had in the interim are  addressed. The PT denies any adverse reactions to current medications since the last visit.  There are no new concerns.  There are no specific complaints  Denies polyuria, polydipsia, blurred vision , or hypoglycemic episodes. FBD average about 150, more invested in testing, recording and monitoring carbs with improved blood sugar and weight loss Feels well overall  ROS Denies recent fever or chills. Denies sinus pressure, nasal congestion, ear pain or sore throat. Denies chest congestion, productive cough or wheezing. Denies chest pains, palpitations and leg swelling Denies abdominal pain, nausea, vomiting,diarrhea or constipation.   Denies dysuria, frequency, hesitancy or incontinence. Denies joint pain, swelling and limitation in mobility. Denies headaches, seizures, numbness, or tingling. Denies depression, anxiety or insomnia. Denies skin break down or rash.   PE  BP 136/84   Pulse 77   Resp 16   Ht 5' 3.25" (1.607 m)   Wt 240 lb 1.9 oz (108.9 kg)   SpO2 100%   BMI 42.20 kg/m   Patient alert and oriented and in no cardiopulmonary distress.  HEENT: No facial asymmetry, EOMI,     Neck supple .  Chest: Clear to auscultation bilaterally.  CVS: S1, S2 no murmurs, no S3.Regular rate.  ABD: Soft non tender.   Ext: No edema  MS: Adequate ROM spine, shoulders, hips and knees.  Skin: Intact, no ulcerations or rash noted.  Psych: Good eye contact, normal affect. Memory intact not anxious or depressed appearing.  CNS: CN 2-12 intact, power,  normal throughout.no  focal deficits noted.   Assessment & Plan  Hypertension goal BP (blood pressure) < 130/80 Controlled, no change in medication DASH diet and commitment to daily physical activity for a minimum of 30 minutes discussed and encouraged, as a part of hypertension management. The importance of attaining a healthy weight is also discussed.  BP/Weight 11/15/2020 08/31/2020 08/21/2020 05/01/2020 01/30/2020 09/12/2019 08/31/2019  Systolic BP 136 133 136 160 150 122 134  Diastolic BP 84 79 82 90 94 76 84  Wt. (Lbs) 240.12 246 244 243 247 247.04 247  BMI 42.2 43.58 43.22 43.05 43.75 43.76 45.18       Morbid obesity Improved, which is great  Patient re-educated about  the importance of commitment to a  minimum of 150 minutes of exercise per week as able.  The importance of healthy food choices with portion control discussed, as well as eating regularly and within a 12 hour window most days. The need to choose "clean , green" food 50 to 75% of the time is discussed, as well as to make water the primary drink and set a goal of 64 ounces water daily.    Weight /BMI 11/15/2020 08/31/2020 08/21/2020  WEIGHT 240 lb 1.9 oz 246 lb 244 lb  HEIGHT 5' 3.25" 5\' 3"  5\' 3"   BMI 42.2 kg/m2 43.58 kg/m2 43.22 kg/m2      Hyperlipidemia Hyperlipidemia:Low fat diet discussed and encouraged.   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   The need to take medication as prescribed, test blood sugar as directed, and to call between visits if there is a concern that blood sugar is  uncontrolled is also discussed.   Tina Wilkins is reminded of the importance of daily foot exam, annual eye examination, and good blood sugar, blood pressure and cholesterol control. Updated lab needed at/ before next visit.  Diabetic Labs Latest Ref Rng & Units 11/14/2020 11/14/2020 08/21/2020 05/16/2020 01/30/2020  HbA1c 4.0 - 5.6 % - - 7.9(A) 7.8(H) 8.2(H)  Microalbumin mg/dL - 4.7 - - -  Micro/Creat Ratio <30 mcg/mg creat - 39(H) - - -  Chol <200 mg/dL - 921 - - 194  HDL > OR = 50 mg/dL - 57 - - 55  Calc LDL mg/dL (calc) - 76 - - 88  Triglycerides <150 mg/dL - 89 - - 77  Creatinine 0.50 - 0.99 mg/dL 1.74(Y) 8.14(G) - 8.18(H) 1.15(H)   BP/Weight 11/15/2020 08/31/2020 08/21/2020 05/01/2020 01/30/2020 09/12/2019 08/31/2019  Systolic BP 136 133 136 160 150 122 134  Diastolic BP 84 79 82 90 94 76 84  Wt. (Lbs) 240.12 246 244 243 247 247.04 247  BMI 42.2 43.58 43.22 43.05 43.75 43.76 45.18   Foot/eye exam completion dates Latest Ref Rng & Units 11/15/2020 09/12/2019  Eye Exam No Retinopathy - -  Foot Form Completion - Done Done        Chronic GERD Controlled, no change in medication

## 2020-11-17 NOTE — Assessment & Plan Note (Signed)
Controlled, no change in medication  

## 2020-11-17 NOTE — Assessment & Plan Note (Signed)
Controlled, no change in medication DASH diet and commitment to daily physical activity for a minimum of 30 minutes discussed and encouraged, as a part of hypertension management. The importance of attaining a healthy weight is also discussed.  BP/Weight 11/15/2020 08/31/2020 08/21/2020 05/01/2020 01/30/2020 09/12/2019 08/31/2019  Systolic BP 136 133 136 160 150 122 134  Diastolic BP 84 79 82 90 94 76 84  Wt. (Lbs) 240.12 246 244 243 247 247.04 247  BMI 42.2 43.58 43.22 43.05 43.75 43.76 45.18

## 2020-11-17 NOTE — Assessment & Plan Note (Signed)
Hyperlipidemia:Low fat diet discussed and encouraged.   Lipid Panel  Lab Results  Component Value Date   CHOL 150 11/14/2020   HDL 57 11/14/2020   LDLCALC 76 11/14/2020   TRIG 89 11/14/2020   CHOLHDL 2.6 11/14/2020   Controlled, no change in medication

## 2020-11-17 NOTE — Assessment & Plan Note (Signed)
Tina Wilkins is reminded of the importance of commitment to daily physical activity for 30 minutes or more, as able and the need to limit carbohydrate intake to 30 to 60 grams per meal to help with blood sugar control.   The need to take medication as prescribed, test blood sugar as directed, and to call between visits if there is a concern that blood sugar is uncontrolled is also discussed.   Tina Wilkins is reminded of the importance of daily foot exam, annual eye examination, and good blood sugar, blood pressure and cholesterol control. Updated lab needed at/ before next visit.  Diabetic Labs Latest Ref Rng & Units 11/14/2020 11/14/2020 08/21/2020 05/16/2020 01/30/2020  HbA1c 4.0 - 5.6 % - - 7.9(A) 7.8(H) 8.2(H)  Microalbumin mg/dL - 4.7 - - -  Micro/Creat Ratio <30 mcg/mg creat - 39(H) - - -  Chol <200 mg/dL - 283 - - 662  HDL > OR = 50 mg/dL - 57 - - 55  Calc LDL mg/dL (calc) - 76 - - 88  Triglycerides <150 mg/dL - 89 - - 77  Creatinine 0.50 - 0.99 mg/dL 9.47(M) 5.46(T) - 0.35(W) 1.15(H)   BP/Weight 11/15/2020 08/31/2020 08/21/2020 05/01/2020 01/30/2020 09/12/2019 08/31/2019  Systolic BP 136 133 136 160 150 122 134  Diastolic BP 84 79 82 90 94 76 84  Wt. (Lbs) 240.12 246 244 243 247 247.04 247  BMI 42.2 43.58 43.22 43.05 43.75 43.76 45.18   Foot/eye exam completion dates Latest Ref Rng & Units 11/15/2020 09/12/2019  Eye Exam No Retinopathy - -  Foot Form Completion - Done Done

## 2020-11-17 NOTE — Assessment & Plan Note (Signed)
Improved, which is great  Patient re-educated about  the importance of commitment to a  minimum of 150 minutes of exercise per week as able.  The importance of healthy food choices with portion control discussed, as well as eating regularly and within a 12 hour window most days. The need to choose "clean , green" food 50 to 75% of the time is discussed, as well as to make water the primary drink and set a goal of 64 ounces water daily.    Weight /BMI 11/15/2020 08/31/2020 08/21/2020  WEIGHT 240 lb 1.9 oz 246 lb 244 lb  HEIGHT 5' 3.25" 5\' 3"  5\' 3"   BMI 42.2 kg/m2 43.58 kg/m2 43.22 kg/m2

## 2020-11-21 ENCOUNTER — Other Ambulatory Visit: Payer: Self-pay | Admitting: Family Medicine

## 2020-11-22 ENCOUNTER — Other Ambulatory Visit (HOSPITAL_COMMUNITY): Payer: Self-pay | Admitting: Family Medicine

## 2020-11-22 DIAGNOSIS — Z1231 Encounter for screening mammogram for malignant neoplasm of breast: Secondary | ICD-10-CM

## 2020-11-27 ENCOUNTER — Other Ambulatory Visit: Payer: Self-pay | Admitting: Family Medicine

## 2020-11-27 ENCOUNTER — Other Ambulatory Visit: Payer: Self-pay

## 2020-11-27 DIAGNOSIS — E1159 Type 2 diabetes mellitus with other circulatory complications: Secondary | ICD-10-CM

## 2020-11-27 LAB — HEMOGLOBIN A1C
Est. average glucose Bld gHb Est-mCnc: 200 mg/dL
Hgb A1c MFr Bld: 8.6 % — ABNORMAL HIGH (ref 4.8–5.6)

## 2020-11-27 MED ORDER — GLIPIZIDE ER 10 MG PO TB24: ORAL_TABLET | ORAL | 3 refills | Status: DC

## 2020-12-03 ENCOUNTER — Other Ambulatory Visit: Payer: Self-pay | Admitting: Family Medicine

## 2020-12-03 MED FILL — METOPROLOL SUCCINATE ER 25: 25 | 30 days supply | Qty: 30 | Fill #1

## 2020-12-03 MED FILL — ROSUVASTATIN CALCIUM 40 MG: 40 | 30 days supply | Qty: 30 | Fill #4

## 2020-12-03 MED FILL — cloNIDine HCL 0.3 MG TABS: 0.3 | 30 days supply | Qty: 30 | Fill #5

## 2020-12-03 MED FILL — AMLODIPINE BESYLATE 10 MG T: 10 | 30 days supply | Qty: 30 | Fill #3

## 2020-12-03 MED FILL — TRIAMTERENE-HCTZ 75-50 MG T: 75-50 | 30 days supply | Qty: 30 | Fill #8

## 2020-12-04 ENCOUNTER — Other Ambulatory Visit: Payer: Self-pay | Admitting: Family Medicine

## 2020-12-21 ENCOUNTER — Other Ambulatory Visit: Payer: Self-pay

## 2020-12-21 ENCOUNTER — Ambulatory Visit (HOSPITAL_COMMUNITY)
Admission: RE | Admit: 2020-12-21 | Discharge: 2020-12-21 | Disposition: A | Discharge status: Home / Self Care | Payer: Medicare Other | Source: Ambulatory Visit | Attending: Family Medicine | Admitting: Family Medicine

## 2020-12-21 DIAGNOSIS — Z1231 Encounter for screening mammogram for malignant neoplasm of breast: Secondary | ICD-10-CM | POA: Diagnosis not present

## 2021-01-01 ENCOUNTER — Ambulatory Visit: Payer: Medicare Other | Admitting: Family Medicine

## 2021-01-02 MED FILL — ROSUVASTATIN CALCIUM 40 MG: 40 | 30 days supply | Qty: 30 | Fill #5

## 2021-01-02 MED FILL — cloNIDine HCL 0.3 MG TABS: 0.3 | 30 days supply | Qty: 30 | Fill #0

## 2021-01-02 MED FILL — METOPROLOL SUCCINATE ER 25: 25 | 30 days supply | Qty: 30 | Fill #2

## 2021-01-02 MED FILL — TRIAMTERENE-HCTZ 75-50 MG T: 75-50 | 30 days supply | Qty: 30 | Fill #9

## 2021-01-24 ENCOUNTER — Encounter: Payer: Self-pay | Admitting: Family Medicine

## 2021-01-24 ENCOUNTER — Other Ambulatory Visit: Payer: Self-pay

## 2021-01-24 ENCOUNTER — Telehealth (INDEPENDENT_AMBULATORY_CARE_PROVIDER_SITE_OTHER): Payer: Medicare Other | Admitting: Family Medicine

## 2021-01-24 VITALS — BP 112/75 | HR 63 | Ht 62.5 in | Wt 235.0 lb

## 2021-01-24 DIAGNOSIS — E782 Mixed hyperlipidemia: Secondary | ICD-10-CM

## 2021-01-24 DIAGNOSIS — E1159 Type 2 diabetes mellitus with other circulatory complications: Secondary | ICD-10-CM

## 2021-01-24 NOTE — Patient Instructions (Addendum)
F/U mid July, calll if you need me sooner  GREAT going with testing, carb counting and exercise , keep it up  Goal for fasting blood sugar or before any meal  ranges from 80 to 130 and 2 hours after any meal or at bedtime should be between 130 to 180.   Test at least once EVERY day  April 4  Or during that week please get non fasting HBA1C , chem 7 and EGFr   For July visit , please get fasting lipid, cmp and EGFr and HBA1C  It is important that you exercise regularly at least 30 minutes 5 times a week. If you develop chest pain, have severe difficulty breathing, or feel very tired, stop exercising immediately and seek medical attention    Diabetes Mellitus and Nutrition, Adult When you have diabetes, or diabetes mellitus, it is very important to have healthy eating habits because your blood sugar (glucose) levels are greatly affected by what you eat and drink. Eating healthy foods in the right amounts, at about the same times every day, can help you:  Control your blood glucose.  Lower your risk of heart disease.  Improve your blood pressure.  Reach or maintain a healthy weight. What can affect my meal plan? Every person with diabetes is different, and each person has different needs for a meal plan. Your health care provider may recommend that you work with a dietitian to make a meal plan that is best for you. Your meal plan may vary depending on factors such as:  The calories you need.  The medicines you take.  Your weight.  Your blood glucose, blood pressure, and cholesterol levels.  Your activity level.  Other health conditions you have, such as heart or kidney disease. How do carbohydrates affect me? Carbohydrates, also called carbs, affect your blood glucose level more than any other type of food. Eating carbs naturally raises the amount of glucose in your blood. Carb counting is a method for keeping track of how many carbs you eat. Counting carbs is important to keep  your blood glucose at a healthy level, especially if you use insulin or take certain oral diabetes medicines. It is important to know how many carbs you can safely have in each meal. This is different for every person. Your dietitian can help you calculate how many carbs you should have at each meal and for each snack. How does alcohol affect me? Alcohol can cause a sudden decrease in blood glucose (hypoglycemia), especially if you use insulin or take certain oral diabetes medicines. Hypoglycemia can be a life-threatening condition. Symptoms of hypoglycemia, such as sleepiness, dizziness, and confusion, are similar to symptoms of having too much alcohol.  Do not drink alcohol if: ? Your health care provider tells you not to drink. ? You are pregnant, may be pregnant, or are planning to become pregnant.  If you drink alcohol: ? Do not drink on an empty stomach. ? Limit how much you use to:  0-1 drink a day for women.  0-2 drinks a day for men. ? Be aware of how much alcohol is in your drink. In the [place] one drink equals one 12 oz bottle of beer (355 mL), one 5 oz glass of wine (148 mL), or one 1 oz glass of hard liquor (44 mL). ? Keep yourself hydrated with water, diet soda, or unsweetened iced tea.  Keep in mind that regular soda, juice, and other mixers may contain a lot of sugar and must  be counted as carbs. What are tips for following this plan? Reading food labels  Start by checking the serving size on the "Nutrition Facts" label of packaged foods and drinks. The amount of calories, carbs, fats, and other nutrients listed on the label is based on one serving of the item. Many items contain more than one serving per package.  Check the total grams (g) of carbs in one serving. You can calculate the number of servings of carbs in one serving by dividing the total carbs by 15. For example, if a food has 30 g of total carbs per serving, it would be equal to 2 servings of carbs.  Check the  number of grams (g) of saturated fats and trans fats in one serving. Choose foods that have a low amount or none of these fats.  Check the number of milligrams (mg) of salt (sodium) in one serving. Most people should limit total sodium intake to less than 2,300 mg per day.  Always check the nutrition information of foods labeled as "low-fat" or "nonfat." These foods may be higher in added sugar or refined carbs and should be avoided.  Talk to your dietitian to identify your daily goals for nutrients listed on the label. Shopping  Avoid buying canned, pre-made, or processed foods. These foods tend to be high in fat, sodium, and added sugar.  Shop around the outside edge of the grocery store. This is where you will most often find fresh fruits and vegetables, bulk grains, fresh meats, and fresh dairy. Cooking  Use low-heat cooking methods, such as baking, instead of high-heat cooking methods like deep frying.  Cook using healthy oils, such as olive, canola, or sunflower oil.  Avoid cooking with butter, cream, or high-fat meats. Meal planning  Eat meals and snacks regularly, preferably at the same times every day. Avoid going long periods of time without eating.  Eat foods that are high in fiber, such as fresh fruits, vegetables, beans, and whole grains. Talk with your dietitian about how many servings of carbs you can eat at each meal.  Eat 4-6 oz (112-168 g) of lean protein each day, such as lean meat, chicken, fish, eggs, or tofu. One ounce (oz) of lean protein is equal to: ? 1 oz (28 g) of meat, chicken, or fish. ? 1 egg. ?  cup (62 g) of tofu.  Eat some foods each day that contain healthy fats, such as avocado, nuts, seeds, and fish.   What foods should I eat? Fruits Berries. Apples. Oranges. Peaches. Apricots. Plums. Grapes. Mango. Papaya. Pomegranate. Kiwi. Cherries. Vegetables Lettuce. Spinach. Leafy greens, including kale, chard, collard greens, and mustard greens. Beets.  Cauliflower. Cabbage. Broccoli. Carrots. Green beans. Tomatoes. Peppers. Onions. Cucumbers. Brussels sprouts. Grains Whole grains, such as whole-wheat or whole-grain bread, crackers, tortillas, cereal, and pasta. Unsweetened oatmeal. Quinoa. Brown or wild rice. Meats and other proteins Seafood. Poultry without skin. Lean cuts of poultry and beef. Tofu. Nuts. Seeds. Dairy Low-fat or fat-free dairy products such as milk, yogurt, and cheese. The items listed above may not be a complete list of foods and beverages you can eat. Contact a dietitian for more information. What foods should I avoid? Fruits Fruits canned with syrup. Vegetables Canned vegetables. Frozen vegetables with butter or cream sauce. Grains Refined white flour and flour products such as bread, pasta, snack foods, and cereals. Avoid all processed foods. Meats and other proteins Fatty cuts of meat. Poultry with skin. Breaded or fried meats. Processed meat. Avoid  saturated fats. Dairy Full-fat yogurt, cheese, or milk. Beverages Sweetened drinks, such as soda or iced tea. The items listed above may not be a complete list of foods and beverages you should avoid. Contact a dietitian for more information. Questions to ask a health care provider  Do I need to meet with a diabetes educator?  Do I need to meet with a dietitian?  What number can I call if I have questions?  When are the best times to check my blood glucose? Where to find more information:  American Diabetes Association: diabetes.org  Academy of Nutrition and Dietetics: www.eatright.[employer] of Diabetes and Digestive and Kidney Diseases: [employer]  Association of Diabetes Care and Education Specialists: www.diabeteseducator.org Summary  It is important to have healthy eating habits because your blood sugar (glucose) levels are greatly affected by what you eat and drink.  A healthy meal plan will help you control your blood glucose  and maintain a healthy lifestyle.  Your health care provider may recommend that you work with a dietitian to make a meal plan that is best for you.  Keep in mind that carbohydrates (carbs) and alcohol have immediate effects on your blood glucose levels. It is important to count carbs and to use alcohol carefully. This information is not intended to replace advice given to you by your health care provider. Make sure you discuss any questions you have with your health care provider. Document Revised: 10/25/2019 Document Reviewed: 10/25/2019 Elsevier Patient Education  2021 [employer].

## 2021-01-24 NOTE — Progress Notes (Signed)
Virtual Visit via Telephone Note  I connected with Tina Wilkins on 01/24/21 at  8:00 AM EST by telephone and verified that I am speaking with the correct person using two identifiers.  Location: Patient: home Provider: office   I discussed the limitations, risks, security and privacy concerns of performing an evaluation and management service by telephone and the availability of in person appointments. I also discussed with the patient that there may be a patient responsible charge related to this service. The patient expressed understanding and agreed to proceed.   History of Present Illness: Testing daily, high was 208, otherwise , 2 hrs after breakfast, 150 to 166, pre meal evening 120's committing to daily exercise inside and outside of the house Reports weight loss though no objective measure Feels better about diabetic management and is not interested in educator, states she has all the resources already and knows what she is doing No concerns or complaints, visit primarily to follow diabetes as she has become uncontrolled at last check    Observations/Objective: BP 112/75   Pulse 63   Ht 5' 2.5" (1.588 m)   Wt 235 lb (106.6 kg)   BMI 42.30 kg/m  Good communication with no confusion and intact memory. Alert and oriented x 3 No signs of respiratory distress during speech    Assessment and Plan: Type 2 diabetes mellitus with vascular disease (HCC) Ms. Mcdermid is reminded of the importance of commitment to daily physical activity for 30 minutes or more, as able and the need to limit carbohydrate intake to 30 to 60 grams per meal to help with blood sugar control.   The need to take medication as prescribed, test blood sugar as directed, and to call between visits if there is a concern that blood sugar is uncontrolled is also discussed.   Ms. Oyster is reminded of the importance of daily foot exam, annual eye examination, and good blood sugar, blood pressure and cholesterol  control.  Diabetic Labs Latest Ref Rng & Units 11/26/2020 11/14/2020 11/14/2020 08/21/2020 05/16/2020  HbA1c 4.8 - 5.6 % 8.6(H) - - 7.9(A) 7.8(H)  Microalbumin mg/dL - - 4.7 - -  Micro/Creat Ratio <30 mcg/mg creat - - 39(H) - -  Chol <200 mg/dL - - 761 - -  HDL > OR = 50 mg/dL - - 57 - -  Calc LDL mg/dL (calc) - - 76 - -  Triglycerides <150 mg/dL - - 89 - -  Creatinine 0.50 - 0.99 mg/dL - 9.50(D) 3.26(Z) - 1.24(P)   BP/Weight 01/24/2021 11/15/2020 08/31/2020 08/21/2020 05/01/2020 01/30/2020 09/12/2019  Systolic BP 112 136 133 136 160 150 122  Diastolic BP 75 84 79 82 90 94 76  Wt. (Lbs) 235 240.12 246 244 243 247 247.04  BMI 42.3 42.2 43.58 43.22 43.05 43.75 43.76   Foot/eye exam completion dates Latest Ref Rng & Units 11/15/2020 09/12/2019  Eye Exam No Retinopathy - -  Foot Form Completion - Done Done   Uncontrolled currently, but seemingly improving since last test, pt to continue daily blood sugar checks, ranges provided verbally and in writing, and also daily exercise encouraged Hba1C in 6 weeks, if good , next appt in 4.5 months  `   Morbid obesity  Patient re-educated about  the importance of commitment to a  minimum of 150 minutes of exercise per week as able.  The importance of healthy food choices with portion control discussed, as well as eating regularly and within a 12 hour window most days. The  need to choose "clean , green" food 50 to 75% of the time is discussed, as well as to make water the primary drink and set a goal of 64 ounces water daily.    Weight /BMI 01/24/2021 11/15/2020 08/31/2020  WEIGHT 235 lb 240 lb 1.9 oz 246 lb  HEIGHT 5' 2.5" 5' 3.25" 5\' 3"   BMI 42.3 kg/m2 42.2 kg/m2 43.58 kg/m2        Follow Up Instructions:    I discussed the assessment and treatment plan with the patient. The patient was provided an opportunity to ask questions and all were answered. The patient agreed with the plan and demonstrated an understanding of the instructions.    The patient was advised to call back or seek an in-person evaluation if the symptoms worsen or if the condition fails to improve as anticipated.  I provided 15 minutes of non-face-to-face time during this encounter.   , MD

## 2021-01-25 NOTE — Assessment & Plan Note (Signed)
  Patient re-educated about  the importance of commitment to a  minimum of 150 minutes of exercise per week as able.  The importance of healthy food choices with portion control discussed, as well as eating regularly and within a 12 hour window most days. The need to choose "clean , green" food 50 to 75% of the time is discussed, as well as to make water the primary drink and set a goal of 64 ounces water daily.    Weight /BMI 01/24/2021 11/15/2020 08/31/2020  WEIGHT 235 lb 240 lb 1.9 oz 246 lb  HEIGHT 5' 2.5" 5' 3.25" 5\' 3"   BMI 42.3 kg/m2 42.2 kg/m2 43.58 kg/m2

## 2021-01-25 NOTE — Assessment & Plan Note (Signed)
Tina Wilkins is reminded of the importance of commitment to daily physical activity for 30 minutes or more, as able and the need to limit carbohydrate intake to 30 to 60 grams per meal to help with blood sugar control.   The need to take medication as prescribed, test blood sugar as directed, and to call between visits if there is a concern that blood sugar is uncontrolled is also discussed.   Tina Wilkins is reminded of the importance of daily foot exam, annual eye examination, and good blood sugar, blood pressure and cholesterol control.  Diabetic Labs Latest Ref Rng & Units 11/26/2020 11/14/2020 11/14/2020 08/21/2020 05/16/2020  HbA1c 4.8 - 5.6 % 8.6(H) - - 7.9(A) 7.8(H)  Microalbumin mg/dL - - 4.7 - -  Micro/Creat Ratio <30 mcg/mg creat - - 39(H) - -  Chol <200 mg/dL - - 462 - -  HDL > OR = 50 mg/dL - - 57 - -  Calc LDL mg/dL (calc) - - 76 - -  Triglycerides <150 mg/dL - - 89 - -  Creatinine 0.50 - 0.99 mg/dL - 7.03(J) 0.09(F) - 8.18(E)   BP/Weight 01/24/2021 11/15/2020 08/31/2020 08/21/2020 05/01/2020 01/30/2020 09/12/2019  Systolic BP 112 136 133 136 160 150 122  Diastolic BP 75 84 79 82 90 94 76  Wt. (Lbs) 235 240.12 246 244 243 247 247.04  BMI 42.3 42.2 43.58 43.22 43.05 43.75 43.76   Foot/eye exam completion dates Latest Ref Rng & Units 11/15/2020 09/12/2019  Eye Exam No Retinopathy - -  Foot Form Completion - Done Done   Uncontrolled currently, but seemingly improving since last test, pt to continue daily blood sugar checks, ranges provided verbally and in writing, and also daily exercise encouraged Hba1C in 6 weeks, if good , next appt in 4.5 months  `

## 2021-02-01 ENCOUNTER — Other Ambulatory Visit: Payer: Self-pay | Admitting: Family Medicine

## 2021-02-01 MED FILL — ROSUVASTATIN CALCIUM 40 MG: 40 | 30 days supply | Qty: 30 | Fill #6

## 2021-02-01 MED FILL — TRIAMTERENE-HCTZ 75-50 MG T: 75-50 | 30 days supply | Qty: 30 | Fill #10

## 2021-02-01 MED FILL — METOPROLOL SUCCINATE ER 25: 25 | 30 days supply | Qty: 30 | Fill #0

## 2021-02-01 MED FILL — LUMIGAN 0.01% EYE DROPS: 0.01 | 25 days supply | Qty: 3 | Fill #1

## 2021-02-01 MED FILL — cloNIDine HCL 0.3 MG TABS: 0.3 | 30 days supply | Qty: 30 | Fill #0

## 2021-03-02 ENCOUNTER — Other Ambulatory Visit (HOSPITAL_BASED_OUTPATIENT_CLINIC_OR_DEPARTMENT_OTHER): Payer: Self-pay

## 2021-03-04 ENCOUNTER — Other Ambulatory Visit (HOSPITAL_BASED_OUTPATIENT_CLINIC_OR_DEPARTMENT_OTHER): Payer: Self-pay

## 2021-03-04 ENCOUNTER — Other Ambulatory Visit: Payer: Self-pay | Admitting: Family Medicine

## 2021-03-04 MED ORDER — CLONIDINE HCL 0.3 MG PO TABS
ORAL_TABLET | Freq: Every day | ORAL | 0 refills | Status: DC
  Filled 2021-03-04: qty 30, 30d supply, fill #0

## 2021-03-04 MED FILL — Rosuvastatin Calcium Tab 40 MG: ORAL | 30 days supply | Qty: 30 | Fill #0 | Status: AC

## 2021-03-04 MED FILL — Metoprolol Succinate Tab ER 24HR 25 MG (Tartrate Equiv): ORAL | 30 days supply | Qty: 30 | Fill #0 | Status: AC

## 2021-03-04 MED FILL — Triamterene & Hydrochlorothiazide Tab 75-50 MG: ORAL | 30 days supply | Qty: 30 | Fill #0 | Status: AC

## 2021-03-04 MED FILL — Bimatoprost Ophth Soln 0.01%: OPHTHALMIC | 30 days supply | Qty: 2.5 | Fill #0 | Status: AC

## 2021-03-04 MED FILL — Amlodipine Besylate Tab 10 MG (Base Equivalent): ORAL | 90 days supply | Qty: 90 | Fill #0 | Status: CN

## 2021-03-05 ENCOUNTER — Other Ambulatory Visit (HOSPITAL_BASED_OUTPATIENT_CLINIC_OR_DEPARTMENT_OTHER): Payer: Self-pay

## 2021-03-05 ENCOUNTER — Other Ambulatory Visit: Payer: Self-pay

## 2021-03-05 ENCOUNTER — Other Ambulatory Visit: Payer: Self-pay | Admitting: Family Medicine

## 2021-03-05 DIAGNOSIS — E1159 Type 2 diabetes mellitus with other circulatory complications: Secondary | ICD-10-CM

## 2021-03-05 DIAGNOSIS — I10 Essential (primary) hypertension: Secondary | ICD-10-CM

## 2021-03-05 LAB — CMP14+EGFR
ALT: 20 IU/L (ref 0–32)
AST: 17 IU/L (ref 0–40)
Albumin/Globulin Ratio: 1.6 (ref 1.2–2.2)
Albumin: 4.5 g/dL (ref 3.8–4.8)
Alkaline Phosphatase: 106 IU/L (ref 44–121)
BUN/Creatinine Ratio: 17 (ref 12–28)
BUN: 17 mg/dL (ref 8–27)
Bilirubin Total: 0.7 mg/dL (ref 0.0–1.2)
CO2: 26 mmol/L (ref 20–29)
Calcium: 9.8 mg/dL (ref 8.7–10.3)
Chloride: 101 mmol/L (ref 96–106)
Creatinine, Ser: 1.03 mg/dL — ABNORMAL HIGH (ref 0.57–1.00)
Globulin, Total: 2.8 g/dL (ref 1.5–4.5)
Glucose: 97 mg/dL (ref 65–99)
Potassium: 3.4 mmol/L — ABNORMAL LOW (ref 3.5–5.2)
Sodium: 145 mmol/L — ABNORMAL HIGH (ref 134–144)
Total Protein: 7.3 g/dL (ref 6.0–8.5)
eGFR: 60 mL/min/{1.73_m2} (ref >59)

## 2021-03-05 LAB — LIPID PANEL
Chol/HDL Ratio: 2.7 ratio (ref 0.0–4.4)
Cholesterol, Total: 159 mg/dL (ref 100–199)
HDL: 59 mg/dL (ref >39)
LDL Chol Calc (NIH): 85 mg/dL (ref 0–99)
Triglycerides: 80 mg/dL (ref 0–149)
VLDL Cholesterol Cal: 15 mg/dL (ref 5–40)

## 2021-03-05 LAB — HEMOGLOBIN A1C
Est. average glucose Bld gHb Est-mCnc: 192 mg/dL
Hgb A1c MFr Bld: 8.3 % — ABNORMAL HIGH (ref 4.8–5.6)

## 2021-03-05 MED ORDER — GLIPIZIDE ER 2.5 MG PO TB24
2.5000 mg | ORAL_TABLET | Freq: Every day | ORAL | 1 refills | Status: DC
  Filled 2021-03-05 – 2021-07-04 (×2): qty 90, 90d supply, fill #0

## 2021-03-06 ENCOUNTER — Other Ambulatory Visit (HOSPITAL_BASED_OUTPATIENT_CLINIC_OR_DEPARTMENT_OTHER): Payer: Self-pay

## 2021-03-12 ENCOUNTER — Other Ambulatory Visit (HOSPITAL_BASED_OUTPATIENT_CLINIC_OR_DEPARTMENT_OTHER): Payer: Self-pay

## 2021-04-01 ENCOUNTER — Other Ambulatory Visit: Payer: Self-pay

## 2021-04-01 MED ORDER — ROSUVASTATIN CALCIUM 40 MG PO TABS: ORAL_TABLET | Freq: Every day | ORAL | 0 refills | Status: DC

## 2021-04-02 ENCOUNTER — Other Ambulatory Visit: Payer: Self-pay | Admitting: Family Medicine

## 2021-04-02 DIAGNOSIS — I10 Essential (primary) hypertension: Secondary | ICD-10-CM

## 2021-04-02 MED FILL — Metoprolol Succinate Tab ER 24HR 25 MG (Tartrate Equiv): ORAL | 30 days supply | Qty: 30 | Fill #1 | Status: AC

## 2021-04-03 ENCOUNTER — Other Ambulatory Visit (HOSPITAL_BASED_OUTPATIENT_CLINIC_OR_DEPARTMENT_OTHER): Payer: Self-pay

## 2021-04-03 ENCOUNTER — Other Ambulatory Visit: Payer: Self-pay | Admitting: Family Medicine

## 2021-04-03 MED ORDER — TRIAMTERENE-HCTZ 75-50 MG PO TABS
1.0000 | ORAL_TABLET | Freq: Every day | ORAL | 11 refills | Status: DC
  Filled 2021-04-03: qty 30, 30d supply, fill #0
  Filled 2021-05-02: qty 30, 30d supply, fill #1
  Filled 2021-05-31: qty 30, 30d supply, fill #2
  Filled 2021-07-04: qty 30, 30d supply, fill #3
  Filled 2021-08-04: qty 30, 30d supply, fill #4
  Filled 2021-09-08: qty 30, 30d supply, fill #5
  Filled 2021-10-06: qty 30, 30d supply, fill #6
  Filled 2021-11-03: qty 30, 30d supply, fill #7
  Filled 2021-12-05: qty 30, 30d supply, fill #8
  Filled 2022-01-09: qty 30, 30d supply, fill #9
  Filled 2022-02-09: qty 30, 30d supply, fill #10
  Filled 2022-03-16: qty 30, 30d supply, fill #11

## 2021-04-03 MED ORDER — CLONIDINE HCL 0.3 MG PO TABS
ORAL_TABLET | Freq: Every day | ORAL | 0 refills | Status: DC
Start: 2021-04-03 — End: 2021-05-02
  Filled 2021-04-03: qty 30, 30d supply, fill #0

## 2021-04-03 MED ORDER — ROSUVASTATIN CALCIUM 40 MG PO TABS
ORAL_TABLET | Freq: Every day | ORAL | 2 refills | Status: DC
  Filled 2021-04-03: qty 90, 90d supply, fill #0
  Filled 2021-07-04: qty 90, 90d supply, fill #1
  Filled 2021-10-06: qty 90, 90d supply, fill #2

## 2021-04-10 ENCOUNTER — Ambulatory Visit: Payer: Medicare Other | Admitting: Family Medicine

## 2021-04-11 ENCOUNTER — Other Ambulatory Visit (HOSPITAL_BASED_OUTPATIENT_CLINIC_OR_DEPARTMENT_OTHER): Payer: Self-pay

## 2021-05-02 ENCOUNTER — Telehealth: Payer: Self-pay

## 2021-05-02 ENCOUNTER — Other Ambulatory Visit: Payer: Self-pay | Admitting: Cardiology

## 2021-05-02 ENCOUNTER — Other Ambulatory Visit: Payer: Self-pay

## 2021-05-02 ENCOUNTER — Other Ambulatory Visit: Payer: Self-pay | Admitting: Family Medicine

## 2021-05-02 ENCOUNTER — Other Ambulatory Visit (HOSPITAL_BASED_OUTPATIENT_CLINIC_OR_DEPARTMENT_OTHER): Payer: Self-pay

## 2021-05-02 MED ORDER — CLONIDINE HCL 0.3 MG PO TABS
ORAL_TABLET | Freq: Every day | ORAL | 6 refills | Status: DC
  Filled 2021-05-02: qty 30, 30d supply, fill #0
  Filled 2021-05-31: qty 30, 30d supply, fill #1
  Filled 2021-07-03: qty 30, 30d supply, fill #2
  Filled 2021-08-04: qty 30, 30d supply, fill #3
  Filled 2021-09-08: qty 30, 30d supply, fill #4
  Filled 2021-10-06: qty 30, 30d supply, fill #5
  Filled 2021-11-03: qty 30, 30d supply, fill #6

## 2021-05-02 MED ORDER — METOPROLOL SUCCINATE ER 25 MG PO TB24
ORAL_TABLET | Freq: Every day | ORAL | 0 refills | Status: DC
  Filled 2021-05-02: qty 30, 30d supply, fill #0

## 2021-05-02 NOTE — Telephone Encounter (Signed)
Refill sent.

## 2021-05-02 NOTE — Telephone Encounter (Signed)
Patient is needing a refill on clonidine sent to [facility] ph# [phone]

## 2021-05-31 ENCOUNTER — Other Ambulatory Visit (HOSPITAL_BASED_OUTPATIENT_CLINIC_OR_DEPARTMENT_OTHER): Payer: Self-pay

## 2021-05-31 ENCOUNTER — Other Ambulatory Visit: Payer: Self-pay | Admitting: Cardiology

## 2021-05-31 MED FILL — Bimatoprost Ophth Soln 0.01%: OPHTHALMIC | 30 days supply | Qty: 2.5 | Fill #1 | Status: AC

## 2021-06-04 ENCOUNTER — Other Ambulatory Visit (HOSPITAL_BASED_OUTPATIENT_CLINIC_OR_DEPARTMENT_OTHER): Payer: Self-pay

## 2021-06-05 ENCOUNTER — Other Ambulatory Visit: Payer: Self-pay | Admitting: *Deleted

## 2021-06-05 ENCOUNTER — Other Ambulatory Visit (HOSPITAL_BASED_OUTPATIENT_CLINIC_OR_DEPARTMENT_OTHER): Payer: Self-pay

## 2021-06-05 MED ORDER — VITAMIN D (ERGOCALCIFEROL) 1.25 MG (50000 UNIT) PO CAPS
ORAL_CAPSULE | ORAL | 5 refills | Status: DC
  Filled 2021-06-05 – 2021-07-04 (×2): qty 4, 28d supply, fill #0
  Filled 2021-10-07: qty 4, 28d supply, fill #1
  Filled 2021-12-05: qty 4, 28d supply, fill #2
  Filled 2022-01-09: qty 4, 28d supply, fill #3
  Filled 2022-03-16: qty 4, 28d supply, fill #4

## 2021-06-11 ENCOUNTER — Other Ambulatory Visit (HOSPITAL_BASED_OUTPATIENT_CLINIC_OR_DEPARTMENT_OTHER): Payer: Self-pay

## 2021-06-13 ENCOUNTER — Other Ambulatory Visit (HOSPITAL_BASED_OUTPATIENT_CLINIC_OR_DEPARTMENT_OTHER): Payer: Self-pay

## 2021-06-14 LAB — BASIC METABOLIC PANEL
BUN/Creatinine Ratio: 14 (ref 12–28)
BUN: 13 mg/dL (ref 8–27)
CO2: 26 mmol/L (ref 20–29)
Calcium: 9.9 mg/dL (ref 8.7–10.3)
Chloride: 101 mmol/L (ref 96–106)
Creatinine, Ser: 0.95 mg/dL (ref 0.57–1.00)
Glucose: 148 mg/dL — ABNORMAL HIGH (ref 65–99)
Potassium: 3.6 mmol/L (ref 3.5–5.2)
Sodium: 142 mmol/L (ref 134–144)
eGFR: 66 mL/min/{1.73_m2} (ref >59)

## 2021-06-14 LAB — HEMOGLOBIN A1C
Est. average glucose Bld gHb Est-mCnc: 192 mg/dL
Hgb A1c MFr Bld: 8.3 % — ABNORMAL HIGH (ref 4.8–5.6)

## 2021-06-20 ENCOUNTER — Ambulatory Visit (INDEPENDENT_AMBULATORY_CARE_PROVIDER_SITE_OTHER): Payer: Medicare Other | Admitting: Family Medicine

## 2021-06-20 ENCOUNTER — Other Ambulatory Visit: Payer: Self-pay

## 2021-06-20 ENCOUNTER — Encounter: Payer: Self-pay | Admitting: Family Medicine

## 2021-06-20 VITALS — BP 133/77 | HR 77 | Resp 16 | Ht 62.5 in | Wt 232.0 lb

## 2021-06-20 DIAGNOSIS — E1159 Type 2 diabetes mellitus with other circulatory complications: Secondary | ICD-10-CM | POA: Diagnosis not present

## 2021-06-20 DIAGNOSIS — E782 Mixed hyperlipidemia: Secondary | ICD-10-CM

## 2021-06-20 DIAGNOSIS — I10 Essential (primary) hypertension: Secondary | ICD-10-CM

## 2021-06-20 NOTE — Patient Instructions (Addendum)
F/U in 6 weeks, with log and meter. call if you need me sooner  Blood pressure medicine is triamterene 75/50 one daily and clonidine 0.3 mg one at night  New for diabetes is glipizide 10 mg AND glipizide 2.5 mg every morning together at breakfast  Fasting blood sugar range is 80 to 130, please drop off your numbers in 2 weeks , I will call you about them  Bedtime crestor  40 mg  for cholesterol  Aspirin 81 mg one daily  Nurse please record booster  Thanks for choosing [facility], we consider it a privelige to serve you.

## 2021-06-23 ENCOUNTER — Encounter: Payer: Self-pay | Admitting: Family Medicine

## 2021-06-23 NOTE — Assessment & Plan Note (Signed)
  Patient re-educated about  the importance of commitment to a  minimum of 150 minutes of exercise per week as able.  The importance of healthy food choices with portion control discussed, as well as eating regularly and within a 12 hour window most days. The need to choose "clean , green" food 50 to 75% of the time is discussed, as well as to make water the primary drink and set a goal of 64 ounces water daily.   improving , she is applauded on this Weight /BMI 06/20/2021 01/24/2021 11/15/2020  WEIGHT 232 lb 235 lb 240 lb 1.9 oz  HEIGHT 5' 2.5" 5' 2.5" 5' 3.25"  BMI 41.76 kg/m2 42.3 kg/m2 42.2 kg/m2

## 2021-06-23 NOTE — Assessment & Plan Note (Signed)
Unchanged Ms. Holster is reminded of the importance of commitment to daily physical activity for 30 minutes or more, as able and the need to limit carbohydrate intake to 30 to 60 grams per meal to help with blood sugar control.   The need to take medication as prescribed, test blood sugar as directed, and to call between visits if there is a concern that blood sugar is uncontrolled is also discussed.   Ms. Catarino is reminded of the importance of daily foot exam, annual eye examination, and good blood sugar, blood pressure and cholesterol control.  Diabetic Labs Latest Ref Rng & Units 06/13/2021 03/04/2021 11/26/2020 11/14/2020 11/14/2020  HbA1c 4.8 - 5.6 % 8.3(H) 8.3(H) 8.6(H) - -  Microalbumin mg/dL - - - - 4.7  Micro/Creat Ratio <30 mcg/mg creat - - - - 39(H)  Chol 100 - 199 mg/dL - 710 - - 626  HDL >94 mg/dL - 59 - - 57  Calc LDL 0 - 99 mg/dL - 85 - - 76  Triglycerides 0 - 149 mg/dL - 80 - - 89  Creatinine 0.57 - 1.00 mg/dL 8.54 6.27(O) - 3.50(K) 1.20(H)   BP/Weight 06/20/2021 01/24/2021 11/15/2020 08/31/2020 08/21/2020 05/01/2020 01/30/2020  Systolic BP 133 112 136 133 136 160 150  Diastolic BP 77 75 84 79 82 90 94  Wt. (Lbs) 232 235 240.12 246 244 243 247  BMI 41.76 42.3 42.2 43.58 43.22 43.05 43.75   Foot/eye exam completion dates Latest Ref Rng & Units 11/15/2020 09/12/2019  Eye Exam No Retinopathy - -  Foot Form Completion - Done Done   Did not add the additional glipizide 2.5 mg dose, did not understand, and does not take metformin, does not tolerate Pt to drop off fasting blood glucose results in the next 2 weeks

## 2021-06-23 NOTE — Assessment & Plan Note (Signed)
Hyperlipidemia:Low fat diet discussed and encouraged.   Lipid Panel  Lab Results  Component Value Date   CHOL 159 03/04/2021   HDL 59 03/04/2021   LDLCALC 85 03/04/2021   TRIG 80 03/04/2021   CHOLHDL 2.7 03/04/2021     Controlled, no change in medication

## 2021-06-23 NOTE — Assessment & Plan Note (Signed)
controlled, medications reviewed, has actually not been taking medication prescribed, so med list is adjusted DASH diet and commitment to daily physical activity for a minimum of 30 minutes discussed and encouraged, as a part of hypertension management. The importance of attaining a healthy weight is also discussed.  BP/Weight 06/20/2021 01/24/2021 11/15/2020 08/31/2020 08/21/2020 05/01/2020 01/30/2020  Systolic BP 133 112 136 133 136 160 150  Diastolic BP 77 75 84 79 82 90 94  Wt. (Lbs) 232 235 240.12 246 244 243 247  BMI 41.76 42.3 42.2 43.58 43.22 43.05 43.75

## 2021-06-23 NOTE — Progress Notes (Signed)
Tina Wilkins     MRN: [id number]      DOB: 12/12/1955   HPI [name] is here for follow up and re-evaluation of chronic medical conditions, medication management and review of any available recent lab and radiology data.  Preventive health is updated, specifically  Cancer screening and Immunization.   Questions or concerns regarding consultations or procedures which the PT has had in the interim are  addressed. Intolerant of metformin so does not take this Has not been taking amlodipine and has normal BP Blood sugar fluctuating a lot Denies polyuria, polydipsia, blurred vision , or hypoglycemic episodes.  There are no new concerns.  There are no specific complaints   ROS Denies recent fever or chills. Denies sinus pressure, nasal congestion, ear pain or sore throat. Denies chest congestion, productive cough or wheezing. Denies chest pains, palpitations and leg swelling Denies abdominal pain, nausea, vomiting,diarrhea or constipation.   Denies dysuria, frequency, hesitancy or incontinence. Denies joint pain, swelling and limitation in mobility. Denies headaches, seizures, numbness, or tingling. Denies depression, anxiety or insomnia. Denies skin break down or rash.   PE  BP 133/77   Pulse 77   Resp 16   Ht 5' 2.5" (1.588 m)   Wt 232 lb (105.2 kg)   SpO2 96%   BMI 41.76 kg/m   Patient alert and oriented and in no cardiopulmonary distress.  HEENT: No facial asymmetry, EOMI,     Neck supple .  Chest: Clear to auscultation bilaterally.  CVS: S1, S2 no murmurs, no S3.Regular rate.  ABD: Soft non tender.   Ext: No edema  MS: Adequate ROM spine, shoulders, hips and knees.  Skin: Intact, no ulcerations or rash noted.  Psych: Good eye contact, normal affect. Memory intact not anxious or depressed appearing.  CNS: CN 2-12 intact, power,  normal throughout.no focal deficits noted.   Assessment & Plan Hypertension goal BP (blood pressure) < 130/80 controlled,  medications reviewed, has actually not been taking medication prescribed, so med list is adjusted DASH diet and commitment to daily physical activity for a minimum of 30 minutes discussed and encouraged, as a part of hypertension management. The importance of attaining a healthy weight is also discussed.  BP/Weight 06/20/2021 01/24/2021 11/15/2020 08/31/2020 08/21/2020 05/01/2020 01/30/2020  Systolic BP 133 112 136 133 136 160 150  Diastolic BP 77 75 84 79 82 90 94  Wt. (Lbs) 232 235 240.12 246 244 243 247  BMI 41.76 42.3 42.2 43.58 43.22 43.05 43.75       Type 2 diabetes mellitus with vascular disease (HCC) Unchanged [name] is reminded of the importance of commitment to daily physical activity for 30 minutes or more, as able and the need to limit carbohydrate intake to 30 to 60 grams per meal to help with blood sugar control.   The need to take medication as prescribed, test blood sugar as directed, and to call between visits if there is a concern that blood sugar is uncontrolled is also discussed.   [name] is reminded of the importance of daily foot exam, annual eye examination, and good blood sugar, blood pressure and cholesterol control.  Diabetic Labs Latest Ref Rng & Units 06/13/2021 03/04/2021 11/26/2020 11/14/2020 11/14/2020  HbA1c 4.8 - 5.6 % 8.3(H) 8.3(H) 8.6(H) - -  Microalbumin mg/dL - - - - 4.7  Micro/Creat Ratio <30 mcg/mg creat - - - - 39(H)  Chol 100 - 199 mg/dL - 532 - - 992  HDL >42 mg/dL -  59 - - 57  Calc LDL 0 - 99 mg/dL - 85 - - 76  Triglycerides 0 - 149 mg/dL - 80 - - 89  Creatinine 0.57 - 1.00 mg/dL 1.15 7.26(O) - 0.35(D) 1.20(H)   BP/Weight 06/20/2021 01/24/2021 11/15/2020 08/31/2020 08/21/2020 05/01/2020 01/30/2020  Systolic BP 133 112 136 133 136 160 150  Diastolic BP 77 75 84 79 82 90 94  Wt. (Lbs) 232 235 240.12 246 244 243 247  BMI 41.76 42.3 42.2 43.58 43.22 43.05 43.75   Foot/eye exam completion dates Latest Ref Rng & Units 11/15/2020 09/12/2019  Eye Exam No  Retinopathy - -  Foot Form Completion - Done Done   Did not add the additional glipizide 2.5 mg dose, did not understand, and does not take metformin, does not tolerate Pt to drop off fasting blood glucose results in the next 2 weeks     Morbid obesity  Patient re-educated about  the importance of commitment to a  minimum of 150 minutes of exercise per week as able.  The importance of healthy food choices with portion control discussed, as well as eating regularly and within a 12 hour window most days. The need to choose "clean , green" food 50 to 75% of the time is discussed, as well as to make water the primary drink and set a goal of 64 ounces water daily.   improving , she is applauded on this Weight /BMI 06/20/2021 01/24/2021 11/15/2020  WEIGHT 232 lb 235 lb 240 lb 1.9 oz  HEIGHT 5' 2.5" 5' 2.5" 5' 3.25"  BMI 41.76 kg/m2 42.3 kg/m2 42.2 kg/m2      Hyperlipidemia Hyperlipidemia:Low fat diet discussed and encouraged.   Lipid Panel  Lab Results  Component Value Date   CHOL 159 03/04/2021   HDL 59 03/04/2021   LDLCALC 85 03/04/2021   TRIG 80 03/04/2021   CHOLHDL 2.7 03/04/2021     Controlled, no change in medication

## 2021-06-25 ENCOUNTER — Other Ambulatory Visit (HOSPITAL_BASED_OUTPATIENT_CLINIC_OR_DEPARTMENT_OTHER): Payer: Self-pay

## 2021-06-25 MED ORDER — BIMATOPROST 0.01 % OP SOLN
1.0000 [drp] | Freq: Every day | OPHTHALMIC | 6 refills | Status: DC
  Filled 2021-06-25 – 2021-11-06 (×2): qty 2.5, 25d supply, fill #0
  Filled 2022-01-09: qty 2.5, 25d supply, fill #1
  Filled 2022-02-09 – 2022-03-16 (×2): qty 2.5, 25d supply, fill #2

## 2021-06-25 MED ORDER — CYCLOSPORINE 0.05 % OP EMUL
1.0000 [drp] | Freq: Two times a day (BID) | OPHTHALMIC | 3 refills | Status: DC
  Filled 2021-06-25: qty 180, 90d supply, fill #0

## 2021-07-04 ENCOUNTER — Telehealth: Payer: Self-pay | Admitting: Family Medicine

## 2021-07-04 ENCOUNTER — Other Ambulatory Visit (HOSPITAL_BASED_OUTPATIENT_CLINIC_OR_DEPARTMENT_OTHER): Payer: Self-pay

## 2021-07-04 NOTE — Telephone Encounter (Signed)
FBG documented and left  from 7/22 to 8/04 range is 120  to 166, averaging about 140, current is glipizide 12.5 mg , think increasing to 15 mg is appropriate  will reach out to her

## 2021-07-05 ENCOUNTER — Other Ambulatory Visit (HOSPITAL_BASED_OUTPATIENT_CLINIC_OR_DEPARTMENT_OTHER): Payer: Self-pay

## 2021-07-08 ENCOUNTER — Telehealth: Payer: Self-pay | Admitting: Cardiology

## 2021-07-08 ENCOUNTER — Ambulatory Visit: Payer: Medicare Other | Admitting: Physician Assistant

## 2021-07-08 MED ORDER — METOPROLOL SUCCINATE ER 25 MG PO TB24: ORAL_TABLET | Freq: Every day | ORAL | 0 refills | Status: DC

## 2021-07-08 NOTE — Telephone Encounter (Signed)
New message  Patient is out of her medication - came in for appt,  did not know it was changed to 08/15/21   *STAT* If patient is at the pharmacy, call can be transferred to refill team.   1. Which medications need to be refilled? (please list name of each medication and dose if known) metoprolol succinate (TOPROL-XL) 25 MG 24 hr tablet  2. Which pharmacy/location (including street and city if local pharmacy) is medication to be sent to? [facility] in [place]   3. Do they need a 30 day or 90 day supply? 90

## 2021-07-08 NOTE — Telephone Encounter (Signed)
Refilled 90 day w/o refill until patient to be seen in sept 2022

## 2021-07-23 ENCOUNTER — Telehealth: Payer: Self-pay | Admitting: Family Medicine

## 2021-07-23 NOTE — Telephone Encounter (Signed)
Plss call pt , let her kow I have reviewed bS numbers from 8/5 to 18/2022 which she left. Tina Wilkins are excellent, ranging from112 to 153, average seems to be around 130, keep up the great work, will see he  09/02, with log and meter

## 2021-07-26 NOTE — Telephone Encounter (Signed)
Pt aware.

## 2021-08-02 ENCOUNTER — Other Ambulatory Visit: Payer: Self-pay

## 2021-08-02 ENCOUNTER — Ambulatory Visit (INDEPENDENT_AMBULATORY_CARE_PROVIDER_SITE_OTHER): Payer: Medicare Other | Admitting: Family Medicine

## 2021-08-02 VITALS — BP 135/81 | HR 66 | Temp 98.6°F | Resp 16 | Ht 63.0 in | Wt 232.1 lb

## 2021-08-02 DIAGNOSIS — E1159 Type 2 diabetes mellitus with other circulatory complications: Secondary | ICD-10-CM

## 2021-08-02 DIAGNOSIS — Z23 Encounter for immunization: Secondary | ICD-10-CM

## 2021-08-02 DIAGNOSIS — I10 Essential (primary) hypertension: Secondary | ICD-10-CM

## 2021-08-02 DIAGNOSIS — E559 Vitamin D deficiency, unspecified: Secondary | ICD-10-CM

## 2021-08-02 NOTE — Patient Instructions (Addendum)
F/U in office with MD week of October 24 , call if you need me sooner  Please get fasting lipid, cmp and EGFR, hBa1C, TSH. Vit D and CBC 5 to 7 days before visit  Flu vaccine today  Stay on same medication dose, you are doing extremely well  It is important that you exercise regularly at least 30 minutes 5 times a week. If you develop chest pain, have severe difficulty breathing, or feel very tired, stop exercising immediately and seek medical attention   Thanks for choosing [facility], we consider it a privelige to serve you.

## 2021-08-05 ENCOUNTER — Encounter: Payer: Self-pay | Admitting: Family Medicine

## 2021-08-05 MED FILL — Bimatoprost Ophth Soln 0.01%: OPHTHALMIC | 30 days supply | Qty: 2.5 | Fill #2 | Status: AC

## 2021-08-05 NOTE — Assessment & Plan Note (Signed)
Improved and well controlled on current med dse, continue same Updated lab needed at/ before next visit. Tina Wilkins is reminded of the importance of commitment to daily physical activity for 30 minutes or more, as able and the need to limit carbohydrate intake to 30 to 60 grams per meal to help with blood sugar control.   The need to take medication as prescribed, test blood sugar as directed, and to call between visits if there is a concern that blood sugar is uncontrolled is also discussed.   Tina Wilkins is reminded of the importance of daily foot exam, annual eye examination, and good blood sugar, blood pressure and cholesterol control.  Diabetic Labs Latest Ref Rng & Units 06/13/2021 03/04/2021 11/26/2020 11/14/2020 11/14/2020  HbA1c 4.8 - 5.6 % 8.3(H) 8.3(H) 8.6(H) - -  Microalbumin mg/dL - - - - 4.7  Micro/Creat Ratio <30 mcg/mg creat - - - - 39(H)  Chol 100 - 199 mg/dL - 161 - - 096  HDL >04 mg/dL - 59 - - 57  Calc LDL 0 - 99 mg/dL - 85 - - 76  Triglycerides 0 - 149 mg/dL - 80 - - 89  Creatinine 0.57 - 1.00 mg/dL 5.40 9.81(X) - 9.14(N) 1.20(H)   BP/Weight 08/02/2021 06/20/2021 01/24/2021 11/15/2020 08/31/2020 08/21/2020 05/01/2020  Systolic BP 135 133 112 136 133 136 160  Diastolic BP 81 77 75 84 79 82 90  Wt. (Lbs) 232.12 232 235 240.12 246 244 243  BMI 41.12 41.76 42.3 42.2 43.58 43.22 43.05   Foot/eye exam completion dates Latest Ref Rng & Units 11/15/2020 09/12/2019  Eye Exam No Retinopathy - -  Foot Form Completion - Done Done

## 2021-08-05 NOTE — Assessment & Plan Note (Signed)
Improved. Pt applauded on succesful weight loss through lifestyle change, and encouraged to continue same.  

## 2021-08-05 NOTE — Assessment & Plan Note (Signed)
Controlled, no change in medication DASH diet and commitment to daily physical activity for a minimum of 30 minutes discussed and encouraged, as a part of hypertension management. The importance of attaining a healthy weight is also discussed.  BP/Weight 08/02/2021 06/20/2021 01/24/2021 11/15/2020 08/31/2020 08/21/2020 05/01/2020  Systolic BP 135 133 112 136 133 136 160  Diastolic BP 81 77 75 84 79 82 90  Wt. (Lbs) 232.12 232 235 240.12 246 244 243  BMI 41.12 41.76 42.3 42.2 43.58 43.22 43.05

## 2021-08-05 NOTE — Progress Notes (Signed)
Tina Wilkins     MRN: [id number]      DOB: Apr 10, 1956   HPI Tina Wilkins is here for follow up and re-evaluation of chronic medical conditions,  in particular uncontrolled diabetes, medication management and review of any available recent lab and radiology data.  Preventive health is updated, specifically  Cancer screening and Immunization.   The PT denies any adverse reactions to current medications since the last visit.  There are no new concerns.  There are no specific complaints  Denies polyuria, polydipsia, blurred vision , or hypoglycemic episodes. Presents with log and meter and has been sending in reports every 2 weeks as arranged and numbers are excellent with FBG average between 125 to 135  ROS Denies recent fever or chills. Denies sinus pressure, nasal congestion, ear pain or sore throat. Denies chest congestion, productive cough or wheezing. Denies chest pains, palpitations and leg swelling Denies abdominal pain, nausea, vomiting,diarrhea or constipation.   Denies dysuria, frequency, hesitancy or incontinence. Denies joint pain, swelling and limitation in mobility. Denies headaches, seizures, numbness, or tingling. Denies depression, anxiety or insomnia. Denies skin break down or rash.   PE  BP 135/81   Pulse 66   Temp 98.6 F (37 C)   Resp 16   Ht 5\' 3"  (1.6 m)   Wt 232 lb 1.9 oz (105.3 kg)   SpO2 94%   BMI 41.12 kg/m   Patient alert and oriented and in no cardiopulmonary distress.  HEENT: No facial asymmetry, EOMI,     Neck supple .  Chest: Clear to auscultation bilaterally.  CVS: S1, S2 no murmurs, no S3.Regular rate.  ABD: Soft non tender.   Ext: No edema  MS: Adequate ROM spine, shoulders, hips and knees.  Skin: Intact, no ulcerations or rash noted.  Psych: Good eye contact, normal affect. Memory intact not anxious or depressed appearing.  CNS: CN 2-12 intact, power,  normal throughout.no focal deficits noted.   Assessment & Plan  Type  2 diabetes mellitus with vascular disease (HCC) Improved and well controlled on current med dse, continue same Updated lab needed at/ before next visit. Tina Wilkins is reminded of the importance of commitment to daily physical activity for 30 minutes or more, as able and the need to limit carbohydrate intake to 30 to 60 grams per meal to help with blood sugar control.   The need to take medication as prescribed, test blood sugar as directed, and to call between visits if there is a concern that blood sugar is uncontrolled is also discussed.   Tina Wilkins is reminded of the importance of daily foot exam, annual eye examination, and good blood sugar, blood pressure and cholesterol control.  Diabetic Labs Latest Ref Rng & Units 06/13/2021 03/04/2021 11/26/2020 11/14/2020 11/14/2020  HbA1c 4.8 - 5.6 % 8.3(H) 8.3(H) 8.6(H) - -  Microalbumin mg/dL - - - - 4.7  Micro/Creat Ratio <30 mcg/mg creat - - - - 39(H)  Chol 100 - 199 mg/dL - 11/16/2020 - - 979  HDL 892 mg/dL - 59 - - 57  Calc LDL 0 - 99 mg/dL - 85 - - 76  Triglycerides 0 - 149 mg/dL - 80 - - 89  Creatinine 0.57 - 1.00 mg/dL >11 9.41) - 7.40(C) 1.20(H)   BP/Weight 08/02/2021 06/20/2021 01/24/2021 11/15/2020 08/31/2020 08/21/2020 05/01/2020  Systolic BP 135 133 112 136 133 136 160  Diastolic BP 81 77 75 84 79 82 90  Wt. (Lbs) 232.12 232 235 240.12 246 244 243  BMI 41.12 41.76 42.3 42.2 43.58 43.22 43.05   Foot/eye exam completion dates Latest Ref Rng & Units 11/15/2020 09/12/2019  Eye Exam No Retinopathy - -  Foot Form Completion - Done Done        Hypertension goal BP (blood pressure) < 130/80 Controlled, no change in medication DASH diet and commitment to daily physical activity for a minimum of 30 minutes discussed and encouraged, as a part of hypertension management. The importance of attaining a healthy weight is also discussed.  BP/Weight 08/02/2021 06/20/2021 01/24/2021 11/15/2020 08/31/2020 08/21/2020 05/01/2020  Systolic BP 135 133 112 136 133  136 160  Diastolic BP 81 77 75 84 79 82 90  Wt. (Lbs) 232.12 232 235 240.12 246 244 243  BMI 41.12 41.76 42.3 42.2 43.58 43.22 43.05       Morbid obesity Improved. Pt applauded on succesful weight loss through lifestyle change, and encouraged to continue same.

## 2021-08-06 ENCOUNTER — Other Ambulatory Visit (HOSPITAL_BASED_OUTPATIENT_CLINIC_OR_DEPARTMENT_OTHER): Payer: Self-pay

## 2021-08-14 ENCOUNTER — Encounter: Payer: Self-pay | Admitting: Cardiology

## 2021-08-14 NOTE — Progress Notes (Signed)
Cardiology Office Note  Date: 08/15/2021   ID: Tina Wilkins, DOB 07-15-1956, MRN [id number]  PCP:  Fayrene Helper, MD  Cardiologist:  Rozann Lesches, MD Electrophysiologist:  None   Chief Complaint  Patient presents with   Cardiac follow-up     History of Present Illness: Tina Wilkins is a 65 y.o. female presenting to reestablish cardiology follow-up, last seen in April 2017.  She has a history of previously documented, brief episodes of PSVT by cardiac monitor in 2017.  This was managed with low-dose beta-blocker at the time.  She states that Toprol-XL has been effective in controlling her palpitations, she ran out of the medication earlier in the summer but is now back on it.  She continues to follow with Dr. Moshe Cipro.  Blood pressure was elevated today, she states that she checks it at home and it is typically much better.  She did take her self off Norvasc.  I personally reviewed her ECG today which shows sinus rhythm with prolonged PR interval, left anterior fascicular block, and poor R wave progression.  Past Medical History:  Diagnosis Date   Essential hypertension 12/02/2003   Hyperlipidemia 12/02/2003   Hypertension    Obesity    PSVT (paroxysmal supraventricular tachycardia) (HCC)    Seasonal allergies    Type 2 diabetes mellitus ([place]) 12/01/2008    Past Surgical History:  Procedure Laterality Date   ABDOMINAL HYSTERECTOMY  2004   Secondary to fibroids, single ovary left   CESAREAN SECTION  1989   2 vaginal deliveries before   SHOULDER SURGERY Left     Current Outpatient Medications  Medication Sig Dispense Refill   Accu-Chek FastClix Lancets MISC USE AS DIRECTED TO CHECK BLOOD SUGAR DAILY 102 each 0   ACCU-CHEK GUIDE test strip USE AS DIRECTED TO CHECK BLOOD SUGAR DAILY 50 strip 0   acetaminophen (TYLENOL) 500 MG tablet Take 500 mg by mouth every 6 (six) hours as needed.     Alcohol Swabs (ALCOHOL PREPS) PADS Use as directed when testing  blood glucose 100 each 5   aspirin EC 81 MG tablet Take 81 mg by mouth daily.     bimatoprost (LUMIGAN) 0.01 % SOLN PLACE 1 DROP INTO BOTH EYES EVERY EVENING 7.5 mL 3   bimatoprost (LUMIGAN) 0.01 % SOLN Instill 1 drop into both eyes at bedtime 2.5 mL 6   blood glucose meter kit and supplies Accuchek meter (per patient request) once daily testing  DX e11.9 1 each 0   calcium-vitamin D (OSCAL WITH D) 500-200 MG-UNIT per tablet Take 1 tablet by mouth 2 (two) times daily.     cloNIDine (CATAPRES) 0.3 MG tablet TAKE 1 TABLET BY MOUTH EVERY NIGHT AT BEDTIME 30 tablet 6   glipiZIDE (GLUCOTROL XL) 10 MG 24 hr tablet Take 1 tablet (10 mg total) by mouth daily with breakfast. 30 tablet 5   glipiZIDE (GLUCOTROL XL) 2.5 MG 24 hr tablet Take 1 tablet (2.5 mg total) by mouth daily with breakfast. 90 tablet 1   LUMIGAN 0.01 % SOLN Place 1 drop into both eyes daily.     metoprolol succinate (TOPROL-XL) 25 MG 24 hr tablet TAKE 1 TABLET (25 MG TOTAL) BY MOUTH DAILY. 90 tablet 0   montelukast (SINGULAIR) 10 MG tablet Take 1 tablet (10 mg total) by mouth at bedtime. 30 tablet 5   naproxen sodium (ALEVE) 220 MG tablet Take 220 mg by mouth daily as needed (pain).     pantoprazole (PROTONIX) 40  MG tablet Take 1 tablet (40 mg total) by mouth daily. 30 tablet 5   RESTASIS 0.05 % ophthalmic emulsion      rosuvastatin (CRESTOR) 40 MG tablet TAKE 1 TABLET (40 MG TOTAL) BY MOUTH DAILY. 90 tablet 2   triamterene-hydrochlorothiazide (MAXZIDE) 75-50 MG tablet TAKE 1 TABLET BY MOUTH DAILY. 30 tablet 11   Vitamin D, Ergocalciferol, (DRISDOL) 1.25 MG (50000 UNIT) CAPS capsule TAKE 1 CAPSULE (50,000 UNITS) BY MOUTH ONCE A WEEK. 4 capsule 5   No current facility-administered medications for this visit.   Allergies:  Peanut-containing drug products, Celecoxib, Metaxalone, Metformin and related, Nolamine [chlorphen-phenind-phenylprop], Sitagliptin-metformin hcl, and Skelaxin   ROS: No dizziness or syncope.  Physical Exam: VS:   BP (!) 138/100   Pulse 73   Ht _0  (1.6 m)   Wt 234 lb (106.1 kg)   SpO2 97%   BMI 41.45 kg/m , BMI Body mass index is 41.45 kg/m.  Wt Readings from Last 3 Encounters:  08/15/21 234 lb (106.1 kg)  08/02/21 232 lb 1.9 oz (105.3 kg)  06/20/21 232 lb (105.2 kg)    General: Patient appears comfortable at rest. HEENT: Conjunctiva and lids normal, wearing a mask. Neck: Supple, no elevated JVP or carotid bruits, no thyromegaly. Lungs: Clear to auscultation, nonlabored breathing at rest. Cardiac: Regular rate and rhythm, no S3, 1/6 systolic murmur, no pericardial rub. Extremities: No pitting edema.  ECG:  An ECG dated 08/31/2019 was personally reviewed today and demonstrated:  Sinus rhythm with prolonged PR interval, left anterior fascicular block and LVH.  Recent Labwork: 11/14/2020: Hemoglobin 12.5; Platelets 426; TSH 4.13 03/04/2021: ALT 20; AST 17 06/13/2021: BUN 13; Creatinine, Ser 0.95; Potassium 3.6; Sodium 142     Component Value Date/Time   CHOL 159 03/04/2021 0927   TRIG 80 03/04/2021 0927   HDL 59 03/04/2021 0927   CHOLHDL 2.7 03/04/2021 0927   CHOLHDL 2.6 11/14/2020 0811   VLDL 17 06/17/2017 1005   LDLCALC 85 03/04/2021 0927   LDLCALC 76 11/14/2020 0811    Other Studies Reviewed Today:  Cardiac monitor May 2017: 48 hour Holter monitor. Sinus rhythm noted. Heart rate ranged from 48 bpm up to 100 bpm with average heart rate 75 bpm. There were episodes of regular SVT at 129, 142, and 148 bpm noted transiently. Rare PACs and PVCs. No pauses.  Echocardiogram 03/31/2016: - Left ventricle: The cavity size was normal. Wall thickness was    increased in a pattern of mild LVH. Systolic function was normal.    The estimated ejection fraction was in the range of 55% to 60%.    Wall motion was normal; there were no regional wall motion    abnormalities. Features are consistent with a pseudonormal left    ventricular filling pattern, with concomitant abnormal relaxation    and  increased filling pressure (grade 2 diastolic dysfunction).  - Aortic valve: Trileaflet; mildly calcified leaflets.  - Mitral valve: Calcified annulus. There was trivial regurgitation.  - Left atrium: The atrium was at the upper limits of normal in    size.  - Atrial septum: No defect or patent foramen ovale was identified.  - Tricuspid valve: There was trivial regurgitation. Peak RV-RA    gradient (S): 24 mm Hg.  - Pulmonary arteries: Systolic pressure could not be accurately    estimated.  - Pericardium, extracardiac: There was no pericardial effusion.   Impressions:   - Mild LVH with LVEF 55-60%. Rate 2 diastolic dysfunction with    increased  LV filling pressure. Upper normal left atrial chamber    size. MAC with trivial mitral regurgitation. Mildly sclerotic    aortic valve. Trivial tricuspid regurgitation with RV-RA gradient    24 mmHg.  Assessment and Plan:  1.  History of palpitations and previously documented episodes of brief PSVT, doing well on low-dose Toprol-XL.  No change in present dose.  ECG reviewed.  Continue observation.  2.  Essential hypertension.  Blood pressure is elevated today, but she states that home checks are much better.  I have asked her to continue to see Dr. Moshe Cipro for routine assessment.  She did take her self off Norvasc, but is otherwise on clonidine and Maxide.  Medication Adjustments/Labs and Tests Ordered: Current medicines are reviewed at length with the patient today.  Concerns regarding medicines are outlined above.   Tests Ordered: Orders Placed This Encounter  Procedures   EKG 12-Lead     Medication Changes: No orders of the defined types were placed in this encounter.   Disposition:  Follow up  1 year.  Signed, Satira Sark, MD, [facility] 08/15/2021 1:46 PM    [facility] [address] [place], [place] [address] Phone: [phone]; Fax: [phone]

## 2021-08-15 ENCOUNTER — Ambulatory Visit (INDEPENDENT_AMBULATORY_CARE_PROVIDER_SITE_OTHER): Payer: Medicare Other | Admitting: Cardiology

## 2021-08-15 ENCOUNTER — Other Ambulatory Visit: Payer: Self-pay

## 2021-08-15 ENCOUNTER — Encounter: Payer: Self-pay | Admitting: Cardiology

## 2021-08-15 VITALS — BP 138/100 | HR 73 | Ht 63.0 in | Wt 234.0 lb

## 2021-08-15 DIAGNOSIS — I471 Supraventricular tachycardia: Secondary | ICD-10-CM | POA: Diagnosis not present

## 2021-08-15 DIAGNOSIS — I10 Essential (primary) hypertension: Secondary | ICD-10-CM

## 2021-08-15 NOTE — Patient Instructions (Signed)
Medication Instructions:  Your physician recommends that you continue on your current medications as directed. Please refer to the Current Medication list given to you today.  *If you need a refill on your cardiac medications before your next appointment, please call your pharmacy*   Lab Work: None today  If you have labs (blood work) drawn today and your tests are completely normal, you will receive your results only by: MyChart Message (if you have MyChart) OR A paper copy in the mail If you have any lab test that is abnormal or we need to change your treatment, we will call you to review the results.   Testing/Procedures: None today    Follow-Up: At [facility], you and your health needs are our priority.  As part of our continuing mission to provide you with exceptional heart care, we have created designated Provider Care Teams.  These Care Teams include your primary Cardiologist (physician) and Advanced Practice Providers (APPs -  Physician Assistants and Nurse Practitioners) who all work together to provide you with the care you need, when you need it.  We recommend signing up for the patient portal called "MyChart".  Sign up information is provided on this After Visit Summary.  MyChart is used to connect with patients for Virtual Visits (Telemedicine).  Patients are able to view lab/test results, encounter notes, upcoming appointments, etc.  Non-urgent messages can be sent to your provider as well.   To learn more about what you can do with MyChart, go to [email or web address].    Your next appointment:   12 month(s)  The format for your next appointment:   In person  Provider:   Nona Dell, MD   Other Instructions  None

## 2021-08-30 ENCOUNTER — Telehealth: Payer: Self-pay | Admitting: Family Medicine

## 2021-08-30 NOTE — Telephone Encounter (Signed)
Patient just returning a nurse call

## 2021-09-03 ENCOUNTER — Encounter: Payer: Medicare Other | Admitting: Family Medicine

## 2021-09-03 NOTE — Telephone Encounter (Signed)
I have no called pt- has a wellness scheduled tomorrow- maybe appt reminder?

## 2021-09-04 ENCOUNTER — Ambulatory Visit (INDEPENDENT_AMBULATORY_CARE_PROVIDER_SITE_OTHER): Payer: Medicare Other

## 2021-09-04 ENCOUNTER — Other Ambulatory Visit: Payer: Self-pay

## 2021-09-04 VITALS — Ht 63.0 in | Wt 234.0 lb

## 2021-09-04 DIAGNOSIS — Z1211 Encounter for screening for malignant neoplasm of colon: Secondary | ICD-10-CM

## 2021-09-04 DIAGNOSIS — Z0000 Encounter for general adult medical examination without abnormal findings: Secondary | ICD-10-CM | POA: Diagnosis not present

## 2021-09-04 NOTE — Progress Notes (Signed)
Subjective:   Tina Wilkins is a 65 y.o. female who presents for Medicare Annual (Subsequent) preventive examination. Virtual Visit via Telephone Note  I connected with  Tina Wilkins on 09/04/21 at  1:00 PM EDT by telephone and verified that I am speaking with the correct person using two identifiers.  Location: Patient: HOME Provider: [facility] Persons participating in the virtual visit: patient/Nurse Health Advisor   I discussed the limitations, risks, security and privacy concerns of performing an evaluation and management service by telephone and the availability of in person appointments. The patient expressed understanding and agreed to proceed.  Interactive audio and video telecommunications were attempted between this nurse and patient, however failed, due to patient having technical difficulties OR patient did not have access to video capability.  We continued and completed visit with audio only.  Some vital signs may be absent or patient reported.   Chriss Driver, LPN  Review of Systems     Cardiac Risk Factors include: advanced age (>58mn, >>17women);diabetes mellitus;hypertension;dyslipidemia;sedentary lifestyle;obesity (BMI >30kg/m2)     Objective:    Today's Vitals   09/04/21 1415 09/04/21 1417  Weight: 234 lb (106.1 kg)   Height: 5' 3"  (1.6 m)   PainSc:  0-No pain   Body mass index is 41.45 kg/m.  Advanced Directives 09/04/2021 08/31/2020 08/31/2019 04/01/2017 07/25/2015  Does Patient Have a Medical Advance Directive? Yes Yes No Yes No;Yes  Type of Advance Directive Living will HTerrellLiving will - HElaineLiving will -  Does patient want to make changes to medical advance directive? - Yes (MAU/Ambulatory/Procedural Areas - Information given) - Yes (MAU/Ambulatory/Procedural Areas - Information given) No - Patient declined  Copy of H[place]in Chart? - No - copy requested - No - copy requested No  - copy requested  Would patient like information on creating a medical advance directive? No - Patient declined - - - No - patient declined information    Current Medications (verified) Outpatient Encounter Medications as of 09/04/2021  Medication Sig   Accu-Chek FastClix Lancets MISC USE AS DIRECTED TO CHECK BLOOD SUGAR DAILY   ACCU-CHEK GUIDE test strip USE AS DIRECTED TO CHECK BLOOD SUGAR DAILY   acetaminophen (TYLENOL) 500 MG tablet Take 500 mg by mouth every 6 (six) hours as needed.   Alcohol Swabs (ALCOHOL PREPS) PADS Use as directed when testing blood glucose   aspirin EC 81 MG tablet Take 81 mg by mouth daily.   bimatoprost (LUMIGAN) 0.01 % SOLN PLACE 1 DROP INTO BOTH EYES EVERY EVENING   bimatoprost (LUMIGAN) 0.01 % SOLN Instill 1 drop into both eyes at bedtime   blood glucose meter kit and supplies Accuchek meter (per patient request) once daily testing  DX e11.9   calcium-vitamin D (OSCAL WITH D) 500-200 MG-UNIT per tablet Take 1 tablet by mouth 2 (two) times daily.   cloNIDine (CATAPRES) 0.3 MG tablet TAKE 1 TABLET BY MOUTH EVERY NIGHT AT BEDTIME   glipiZIDE (GLUCOTROL XL) 10 MG 24 hr tablet Take 1 tablet (10 mg total) by mouth daily with breakfast.   glipiZIDE (GLUCOTROL XL) 2.5 MG 24 hr tablet Take 1 tablet (2.5 mg total) by mouth daily with breakfast.   LUMIGAN 0.01 % SOLN Place 1 drop into both eyes daily.   metoprolol succinate (TOPROL-XL) 25 MG 24 hr tablet TAKE 1 TABLET (25 MG TOTAL) BY MOUTH DAILY.   montelukast (SINGULAIR) 10 MG tablet Take 1 tablet (10 mg  total) by mouth at bedtime.   naproxen sodium (ALEVE) 220 MG tablet Take 220 mg by mouth daily as needed (pain).   pantoprazole (PROTONIX) 40 MG tablet Take 1 tablet (40 mg total) by mouth daily.   RESTASIS 0.05 % ophthalmic emulsion    rosuvastatin (CRESTOR) 40 MG tablet TAKE 1 TABLET (40 MG TOTAL) BY MOUTH DAILY.   triamterene-hydrochlorothiazide (MAXZIDE) 75-50 MG tablet TAKE 1 TABLET BY MOUTH DAILY.   Vitamin D,  Ergocalciferol, (DRISDOL) 1.25 MG (50000 UNIT) CAPS capsule TAKE 1 CAPSULE (50,000 UNITS) BY MOUTH ONCE A WEEK.   No facility-administered encounter medications on file as of 09/04/2021.    Allergies (verified) Peanut-containing drug products, Celecoxib, Metaxalone, Metformin and related, Nolamine [chlorphen-phenind-phenylprop], Sitagliptin-metformin hcl, and Skelaxin   History: Past Medical History:  Diagnosis Date   Essential hypertension 12/02/2003   Hyperlipidemia 12/02/2003   Hypertension    Obesity    PSVT (paroxysmal supraventricular tachycardia) (HCC)    Seasonal allergies    Type 2 diabetes mellitus ([place]) 12/01/2008   Past Surgical History:  Procedure Laterality Date   ABDOMINAL HYSTERECTOMY  2004   Secondary to fibroids, single ovary left   CESAREAN SECTION  1989   2 vaginal deliveries before   SHOULDER SURGERY Left    Family History  Problem Relation Age of Onset   Heart disease Mother    Stroke Mother    Heart disease Father    Stroke Father    Diabetes Father    Diabetes Sister    Heart disease Sister    Lymphoma Sister    Cancer Brother    Heart disease Brother    Cancer Brother        unknown type   Lung disease Brother    Heart disease Brother    Liver cancer Brother    Lung cancer Brother    Liver cancer Brother    Lung cancer Brother    Seizures Son    Social History   Socioeconomic History   Marital status: Married    Spouse name: Hall Busing   Number of children: 3   Years of education: Not on file   Highest education level: Not on file  Occupational History   Not on file  Tobacco Use   Smoking status: Never   Smokeless tobacco: Never  Vaping Use   Vaping Use: Never used  Substance and Sexual Activity   Alcohol use: No    Alcohol/week: 0.0 standard drinks   Drug use: No   Sexual activity: Yes    Birth control/protection: Surgical  Other Topics Concern   Not on file  Social History Narrative   2 children.    3 grandchildren.    Social Determinants of Health   Financial Resource Strain: Not on file  Food Insecurity: No Food Insecurity   Worried About Charity fundraiser in the Last Year: Never true   Ran Out of Food in the Last Year: Never true  Transportation Needs: No Transportation Needs   Lack of Transportation (Medical): No   Lack of Transportation (Non-Medical): No  Physical Activity: Insufficiently Active   Days of Exercise per Week: 3 days   Minutes of Exercise per Session: 30 min  Stress: No Stress Concern Present   Feeling of Stress : Not at all  Social Connections: Socially Integrated   Frequency of Communication with Friends and Family: More than three times a week   Frequency of Social Gatherings with Friends and Family: More than three times a  week   Attends Religious Services: More than 4 times per year   Active Member of Clubs or Organizations: Yes   Attends Music therapist: More than 4 times per year   Marital Status: Married    Tobacco Counseling Counseling given: Not Answered   Clinical Intake:  Pre-visit preparation completed: Yes  Pain : No/denies pain Pain Score: 0-No pain     BMI - recorded: 41.45 Nutritional Status: BMI > 30  Obese Nutritional Risks: None Diabetes: Yes  How often do you need to have someone help you when you read instructions, pamphlets, or other written materials from your doctor or pharmacy?: 1 - Never  Diabetic?Nutrition Risk Assessment:  Has the patient had any N/V/D within the last 2 months?  No  Does the patient have any non-healing wounds?  No  Has the patient had any unintentional weight loss or weight gain?  No   Diabetes:  Is the patient diabetic?  Yes  If diabetic, was a CBG obtained today?  No  Did the patient bring in their glucometer from home?  No  How often do you monitor your CBG's? Daily, 150 this am.   Financial Strains and Diabetes Management:  Are you having any financial strains with the device, your  supplies or your medication? No .  Does the patient want to be seen by Chronic Care Management for management of their diabetes?  No  Would the patient like to be referred to a Nutritionist or for Diabetic Management?  No   Diabetic Exams:  Diabetic Eye Exam: Completed 07/2021.  Pt has been advised about the importance in completing this exam.  Diabetic Foot Exam: Completed 11/17/2020. Pt has been advised about the importance in completing this exam.    Interpreter Needed?: No  Information entered by :: MJ Holston Oyama, LPN   Activities of Daily Living In your present state of health, do you have any difficulty performing the following activities: 09/04/2021  Hearing? N  Vision? N  Difficulty concentrating or making decisions? N  Walking or climbing stairs? N  Dressing or bathing? N  Doing errands, shopping? N  Preparing Food and eating ? N  Using the Toilet? N  In the past six months, have you accidently leaked urine? N  Do you have problems with loss of bowel control? N  Managing your Medications? N  Managing your Finances? N  Housekeeping or managing your Housekeeping? N  Some recent data might be hidden    Patient Care Team: Fayrene Helper, MD as PCP - General Domenic Polite Aloha Gell, MD as PCP - Cardiology (Cardiology) Carol Ada, MD as Consulting Physician (Gastroenterology) Marylynn Pearson, MD as Consulting Physician (Ophthalmology) Carole Civil, MD as Consulting Physician (Orthopedic Surgery)  Indicate any recent Medical Services you may have received from other than [facility] providers in the past year (date may be approximate).     Assessment:   This is a routine wellness examination for Sontee.  Hearing/Vision screen Hearing Screening - Comments:: No hearing issues.  Vision Screening - Comments:: Glasses. Dr. Venetia Maxon. 07/2021.  Dietary issues and exercise activities discussed: Current Exercise Habits: Home exercise routine, Type of exercise: walking, Time  (Minutes): 20, Frequency (Times/Week): 5, Weekly Exercise (Minutes/Week): 100, Intensity: Mild, Exercise limited by: cardiac condition(s)   Goals Addressed             This Visit's Progress    Exercise 3x per week (30 min per time)   On track    Obese,  recommend increasing your exercise program to 30-45 minutes at least 3 days a week as tolerated.         Depression Screen PHQ 2/9 Scores 09/04/2021 08/02/2021 01/24/2021 11/15/2020 08/31/2020 08/31/2020 01/30/2020  PHQ - 2 Score 0 0 0 0 0 0 0  PHQ- 9 Score - - - - - - -    Fall Risk Fall Risk  09/04/2021 08/02/2021 01/24/2021 11/15/2020 08/31/2020  Falls in the past year? 0 0 0 0 0  Number falls in past yr: 0 0 0 - 0  Injury with Fall? 1 0 0 - 0  Risk for fall due to : No Fall Risks - No Fall Risks - -  Follow up Falls prevention discussed - Falls evaluation completed - Falls evaluation completed    Albers:  Any stairs in or around the home? No  If so, are there any without handrails? No  Home free of loose throw rugs in walkways, pet beds, electrical cords, etc? Yes  Adequate lighting in your home to reduce risk of falls? Yes   ASSISTIVE DEVICES UTILIZED TO PREVENT FALLS:  Life alert? No  Use of a cane, walker or w/c? No  Grab bars in the bathroom? Yes  Shower chair or bench in shower? No  Elevated toilet seat or a handicapped toilet? Yes   TIMED UP AND GO:  Was the test performed? No . Phone visit.    Cognitive Function: Normal cognitive status assessed by direct observation by this Nurse Health Advisor. No abnormalities found.       6CIT Screen 08/31/2020 08/31/2019 04/01/2017  What Year? 0 points 0 points 0 points  What month? 0 points 0 points 0 points  What time? 0 points 0 points 0 points  Count back from 20 0 points 0 points 0 points  Months in reverse 0 points 0 points 0 points  Repeat phrase 0 points 0 points 0 points  Total Score 0 0 0    Immunizations Immunization History   Administered Date(s) Administered   Fluad Quad(high Dose 65+) 08/31/2019, 08/21/2020, 08/02/2021   Influenza Split 09/11/2014   Influenza Whole 10/06/2006, 08/22/2009   Influenza,inj,Quad PF,6+ Mos 07/28/2018   Moderna Sars-Covid-2 Vaccination 01/08/2020, 02/08/2020, 10/09/2020, 05/13/2021   Pneumococcal Conjugate-13 02/14/2015   Pneumococcal Polysaccharide-23 09/22/2013, 09/12/2019   Tdap 01/26/2013   Zoster Recombinat (Shingrix) 04/06/2019, 06/28/2019   Zoster, Live 10/18/2014    TDAP status: Up to date  Flu Vaccine status: Up to date  Pneumococcal vaccine status: Up to date  Covid-19 vaccine status: Completed vaccines  Qualifies for Shingles Vaccine? Yes   Zostavax completed Yes   Shingrix Completed?: Yes  Screening Tests Health Maintenance  Topic Date Due   OPHTHALMOLOGY EXAM  07/15/2021   FOOT EXAM  11/17/2021   HEMOGLOBIN A1C  12/14/2021   MAMMOGRAM  12/21/2022   TETANUS/TDAP  01/26/2023   COLONOSCOPY (Pts 45-46yr Insurance coverage will need to be confirmed)  08/01/2025   INFLUENZA VACCINE  Completed   DEXA SCAN  Completed   COVID-19 Vaccine  Completed   Hepatitis C Screening  Completed   Zoster Vaccines- Shingrix  Completed   HPV VACCINES  Aged Out    Health Maintenance  Health Maintenance Due  Topic Date Due   OPHTHALMOLOGY EXAM  07/15/2021    Colorectal cancer screening: Type of screening: Colonoscopy. Completed 08/02/2015. Repeat every 3-5 years Order placed today to repeat.  Mammogram status: Completed 12/21/20. Repeat every year  Bone Density  status: Completed 12/19/2019. Results reflect: Bone density results: OSTEOPENIA. Repeat every 2 years.  Lung Cancer Screening: (Low Dose CT Chest recommended if Age 55-80 years, 30 pack-year currently smoking OR have quit w/in 15years.) does not qualify.    Additional Screening:  Hepatitis C Screening: does qualify; Completed 02/27/2016  Vision Screening: Recommended annual ophthalmology exams for early  detection of glaucoma and other disorders of the eye. Is the patient up to date with their annual eye exam?  Yes  Who is the provider or what is the name of the office in which the patient attends annual eye exams? Dr. Venetia Maxon in [place] If pt is not established with a provider, would they like to be referred to a provider to establish care? No .   Dental Screening: Recommended annual dental exams for proper oral hygiene  Community Resource Referral / Chronic Care Management: CRR required this visit?  No   CCM required this visit?  No      Plan:     I have personally reviewed and noted the following in the patient's chart:   Medical and social history Use of alcohol, tobacco or illicit drugs  Current medications and supplements including opioid prescriptions.  Functional ability and status Nutritional status Physical activity Advanced directives List of other physicians Hospitalizations, surgeries, and ER visits in previous 12 months Vitals Screenings to include cognitive, depression, and falls Referrals and appointments  In addition, I have reviewed and discussed with patient certain preventive protocols, quality metrics, and best practice recommendations. A written personalized care plan for preventive services as well as general preventive health recommendations were provided to patient.     Chriss Driver, LPN   07/01/652   Nurse Notes: Doing well. Requests order for colonoscopy. Order placed. Up to date on all other health screenings and vaccines.

## 2021-09-04 NOTE — Patient Instructions (Signed)
[name] , Thank you for taking time to come for your Medicare Wellness Visit. I appreciate your ongoing commitment to your health goals. Please review the following plan we discussed and let me know if I can assist you in the future.   Screening recommendations/referrals: Colonoscopy: Done 08/02/2015, Order placed today for follow up Mammogram: Done 12/21/20 Repeat annually  Bone Density: Done 12/19/19 Repeat every 2 years  Recommended yearly ophthalmology/optometry visit for glaucoma screening and checkup Recommended yearly dental visit for hygiene and checkup  Vaccinations: Influenza vaccine: Done 08/02/21 Repeat annually  Pneumococcal vaccine: Done 02/14/2015 and 09/12/2019 Tdap vaccine: Done 01/26/2013 Repeat in 10 years  Shingles vaccine: Done 10/18/2014, 04/06/2019 and 06/28/2019    Covid-19:Done 01/08/20, 02/08/20, 10/19/20 and 05/13/21.  Advanced directives: Advance directive discussed with you today. I have provided a copy for you to complete at home and have notarized. Once this is complete please bring a copy in to our office so we can scan it into your chart.   Conditions/risks identified: Aim for 30 minutes of exercise or walking each day, drink 6-8 glasses of water and eat lots of fruits and vegetables.   Next appointment: Follow up in one year for your annual wellness visit 2023.   Preventive Care 33 Years and Older, Female Preventive care refers to lifestyle choices and visits with your health care provider that can promote health and wellness. What does preventive care include? A yearly physical exam. This is also called an annual well check. Dental exams once or twice a year. Routine eye exams. Ask your health care provider how often you should have your eyes checked. Personal lifestyle choices, including: Daily care of your teeth and gums. Regular physical activity. Eating a healthy diet. Avoiding tobacco and drug use. Limiting alcohol use. Practicing safe  sex. Taking low-dose aspirin every day. Taking vitamin and mineral supplements as recommended by your health care provider. What happens during an annual well check? The services and screenings done by your health care provider during your annual well check will depend on your age, overall health, lifestyle risk factors, and family history of disease. Counseling  Your health care provider may ask you questions about your: Alcohol use. Tobacco use. Drug use. Emotional well-being. Home and relationship well-being. Sexual activity. Eating habits. History of falls. Memory and ability to understand (cognition). Work and work Astronomer. Reproductive health. Screening  You may have the following tests or measurements: Height, weight, and BMI. Blood pressure. Lipid and cholesterol levels. These may be checked every 5 years, or more frequently if you are over 66 years old. Skin check. Lung cancer screening. You may have this screening every year starting at age 67 if you have a 30-pack-year history of smoking and currently smoke or have quit within the past 15 years. Fecal occult blood test (FOBT) of the stool. You may have this test every year starting at age 32. Flexible sigmoidoscopy or colonoscopy. You may have a sigmoidoscopy every 5 years or a colonoscopy every 10 years starting at age 75. Hepatitis C blood test. Hepatitis B blood test. Sexually transmitted disease (STD) testing. Diabetes screening. This is done by checking your blood sugar (glucose) after you have not eaten for a while (fasting). You may have this done every 1-3 years. Bone density scan. This is done to screen for osteoporosis. You may have this done starting at age 90. Mammogram. This may be done every 1-2 years. Talk to your health care provider about how often you should have  regular mammograms. Talk with your health care provider about your test results, treatment options, and if necessary, the need for more  tests. Vaccines  Your health care provider may recommend certain vaccines, such as: Influenza vaccine. This is recommended every year. Tetanus, diphtheria, and acellular pertussis (Tdap, Td) vaccine. You may need a Td booster every 10 years. Zoster vaccine. You may need this after age 39. Pneumococcal 13-valent conjugate (PCV13) vaccine. One dose is recommended after age 59. Pneumococcal polysaccharide (PPSV23) vaccine. One dose is recommended after age 21. Talk to your health care provider about which screenings and vaccines you need and how often you need them. This information is not intended to replace advice given to you by your health care provider. Make sure you discuss any questions you have with your health care provider. Document Released: 12/14/2015 Document Revised: 08/06/2016 Document Reviewed: 09/18/2015 Elsevier Interactive Patient Education  2017 [employer] Prevention in the Home Falls can cause injuries. They can happen to people of all ages. There are many things you can do to make your home safe and to help prevent falls. What can I do on the outside of my home? Regularly fix the edges of walkways and driveways and fix any cracks. Remove anything that might make you trip as you walk through a door, such as a raised step or threshold. Trim any bushes or trees on the path to your home. Use bright outdoor lighting. Clear any walking paths of anything that might make someone trip, such as rocks or tools. Regularly check to see if handrails are loose or broken. Make sure that both sides of any steps have handrails. Any raised decks and porches should have guardrails on the edges. Have any leaves, snow, or ice cleared regularly. Use sand or salt on walking paths during winter. Clean up any spills in your garage right away. This includes oil or grease spills. What can I do in the bathroom? Use night lights. Install grab bars by the toilet and in the tub and shower.  Do not use towel bars as grab bars. Use non-skid mats or decals in the tub or shower. If you need to sit down in the shower, use a plastic, non-slip stool. Keep the floor dry. Clean up any water that spills on the floor as soon as it happens. Remove soap buildup in the tub or shower regularly. Attach bath mats securely with double-sided non-slip rug tape. Do not have throw rugs and other things on the floor that can make you trip. What can I do in the bedroom? Use night lights. Make sure that you have a light by your bed that is easy to reach. Do not use any sheets or blankets that are too big for your bed. They should not hang down onto the floor. Have a firm chair that has side arms. You can use this for support while you get dressed. Do not have throw rugs and other things on the floor that can make you trip. What can I do in the kitchen? Clean up any spills right away. Avoid walking on wet floors. Keep items that you use a lot in easy-to-reach places. If you need to reach something above you, use a strong step stool that has a grab bar. Keep electrical cords out of the way. Do not use floor polish or wax that makes floors slippery. If you must use wax, use non-skid floor wax. Do not have throw rugs and other things on the floor that  can make you trip. What can I do with my stairs? Do not leave any items on the stairs. Make sure that there are handrails on both sides of the stairs and use them. Fix handrails that are broken or loose. Make sure that handrails are as long as the stairways. Check any carpeting to make sure that it is firmly attached to the stairs. Fix any carpet that is loose or worn. Avoid having throw rugs at the top or bottom of the stairs. If you do have throw rugs, attach them to the floor with carpet tape. Make sure that you have a light switch at the top of the stairs and the bottom of the stairs. If you do not have them, ask someone to add them for you. What else  can I do to help prevent falls? Wear shoes that: Do not have high heels. Have rubber bottoms. Are comfortable and fit you well. Are closed at the toe. Do not wear sandals. If you use a stepladder: Make sure that it is fully opened. Do not climb a closed stepladder. Make sure that both sides of the stepladder are locked into place. Ask someone to hold it for you, if possible. Clearly mark and make sure that you can see: Any grab bars or handrails. First and last steps. Where the edge of each step is. Use tools that help you move around (mobility aids) if they are needed. These include: Canes. Walkers. Scooters. Crutches. Turn on the lights when you go into a dark area. Replace any light bulbs as soon as they burn out. Set up your furniture so you have a clear path. Avoid moving your furniture around. If any of your floors are uneven, fix them. If there are any pets around you, be aware of where they are. Review your medicines with your doctor. Some medicines can make you feel dizzy. This can increase your chance of falling. Ask your doctor what other things that you can do to help prevent falls. This information is not intended to replace advice given to you by your health care provider. Make sure you discuss any questions you have with your health care provider. Document Released: 09/13/2009 Document Revised: 04/24/2016 Document Reviewed: 12/22/2014 Elsevier Interactive Patient Education  2017 [employer].

## 2021-09-09 ENCOUNTER — Other Ambulatory Visit (HOSPITAL_BASED_OUTPATIENT_CLINIC_OR_DEPARTMENT_OTHER): Payer: Self-pay

## 2021-09-21 LAB — CMP14+EGFR
ALT: 24 IU/L (ref 0–32)
AST: 21 IU/L (ref 0–40)
Albumin/Globulin Ratio: 1.7 (ref 1.2–2.2)
Albumin: 4.2 g/dL (ref 3.8–4.8)
Alkaline Phosphatase: 101 IU/L (ref 44–121)
BUN/Creatinine Ratio: 18 (ref 12–28)
BUN: 16 mg/dL (ref 8–27)
Bilirubin Total: 0.6 mg/dL (ref 0.0–1.2)
CO2: 26 mmol/L (ref 20–29)
Calcium: 9.8 mg/dL (ref 8.7–10.3)
Chloride: 102 mmol/L (ref 96–106)
Creatinine, Ser: 0.91 mg/dL (ref 0.57–1.00)
Globulin, Total: 2.5 g/dL (ref 1.5–4.5)
Glucose: 142 mg/dL — ABNORMAL HIGH (ref 70–99)
Potassium: 3.7 mmol/L (ref 3.5–5.2)
Sodium: 143 mmol/L (ref 134–144)
Total Protein: 6.7 g/dL (ref 6.0–8.5)
eGFR: 69 mL/min/{1.73_m2} (ref >59)

## 2021-09-21 LAB — LIPID PANEL WITH LDL/HDL RATIO
Cholesterol, Total: 158 mg/dL (ref 100–199)
HDL: 64 mg/dL (ref >39)
LDL Chol Calc (NIH): 79 mg/dL (ref 0–99)
LDL/HDL Ratio: 1.2 ratio (ref 0.0–3.2)
Triglycerides: 82 mg/dL (ref 0–149)
VLDL Cholesterol Cal: 15 mg/dL (ref 5–40)

## 2021-09-21 LAB — CBC
Hematocrit: 35.2 % (ref 34.0–46.6)
Hemoglobin: 11.8 g/dL (ref 11.1–15.9)
MCH: 27.3 pg (ref 26.6–33.0)
MCHC: 33.5 g/dL (ref 31.5–35.7)
MCV: 81 fL (ref 79–97)
Platelets: 388 10*3/uL (ref 150–450)
RBC: 4.33 x10E6/uL (ref 3.77–5.28)
RDW: 13.9 % (ref 11.7–15.4)
WBC: 6.4 10*3/uL (ref 3.4–10.8)

## 2021-09-21 LAB — TSH: TSH: 2.6 u[IU]/mL (ref 0.450–4.500)

## 2021-09-21 LAB — HEMOGLOBIN A1C
Est. average glucose Bld gHb Est-mCnc: 177 mg/dL
Hgb A1c MFr Bld: 7.8 % — ABNORMAL HIGH (ref 4.8–5.6)

## 2021-09-21 LAB — VITAMIN D 25 HYDROXY (VIT D DEFICIENCY, FRACTURES): Vit D, 25-Hydroxy: 31.9 ng/mL (ref 30.0–100.0)

## 2021-09-24 ENCOUNTER — Other Ambulatory Visit: Payer: Self-pay

## 2021-09-24 ENCOUNTER — Telehealth: Payer: Self-pay

## 2021-09-24 ENCOUNTER — Other Ambulatory Visit (HOSPITAL_BASED_OUTPATIENT_CLINIC_OR_DEPARTMENT_OTHER): Payer: Self-pay

## 2021-09-24 ENCOUNTER — Ambulatory Visit (INDEPENDENT_AMBULATORY_CARE_PROVIDER_SITE_OTHER): Payer: Medicare Other | Admitting: Family Medicine

## 2021-09-24 ENCOUNTER — Encounter: Payer: Self-pay | Admitting: Family Medicine

## 2021-09-24 VITALS — BP 141/81 | HR 66 | Resp 18 | Ht 63.0 in | Wt 235.1 lb

## 2021-09-24 DIAGNOSIS — J309 Allergic rhinitis, unspecified: Secondary | ICD-10-CM

## 2021-09-24 DIAGNOSIS — E1159 Type 2 diabetes mellitus with other circulatory complications: Secondary | ICD-10-CM

## 2021-09-24 DIAGNOSIS — Z1231 Encounter for screening mammogram for malignant neoplasm of breast: Secondary | ICD-10-CM

## 2021-09-24 DIAGNOSIS — I10 Essential (primary) hypertension: Secondary | ICD-10-CM

## 2021-09-24 DIAGNOSIS — E559 Vitamin D deficiency, unspecified: Secondary | ICD-10-CM

## 2021-09-24 DIAGNOSIS — E782 Mixed hyperlipidemia: Secondary | ICD-10-CM

## 2021-09-24 MED ORDER — OLMESARTAN MEDOXOMIL 20 MG PO TABS
20.0000 mg | ORAL_TABLET | Freq: Every day | ORAL | 2 refills | Status: DC
  Filled 2021-09-24: qty 30, 30d supply, fill #0
  Filled 2021-11-03: qty 30, 30d supply, fill #1
  Filled 2021-12-05: qty 30, 30d supply, fill #2

## 2021-09-24 MED ORDER — TIRZEPATIDE 2.5 MG/0.5ML ~~LOC~~ SOAJ
2.5000 mg | SUBCUTANEOUS | 1 refills | Status: DC
  Filled 2021-09-24: qty 2, 28d supply, fill #0

## 2021-09-24 NOTE — Assessment & Plan Note (Signed)
contiue weekly supplement , re veal in 2023

## 2021-09-24 NOTE — Assessment & Plan Note (Signed)
  Patient re-educated about  the importance of commitment to a  minimum of 150 minutes of exercise per week as able.  The importance of healthy food choices with portion control discussed, as well as eating regularly and within a 12 hour window most days. The need to choose "clean , green" food 50 to 75% of the time is discussed, as well as to make water the primary drink and set a goal of 64 ounces water daily.    Weight /BMI 09/24/2021 09/04/2021 08/15/2021  WEIGHT 235 lb 1.9 oz 234 lb 234 lb  HEIGHT - 5\' 3"  5\' 3"   BMI 41.65 kg/m2 41.45 kg/m2 41.45 kg/m2

## 2021-09-24 NOTE — Assessment & Plan Note (Addendum)
Improved but still not at goal, start mounjaro if covered and reduce glipizide [name] is reminded of the importance of commitment to daily physical activity for 30 minutes or more, as able and the need to limit carbohydrate intake to 30 to 60 grams per meal to help with blood sugar control.   The need to take medication as prescribed, test blood sugar as directed, and to call between visits if there is a concern that blood sugar is uncontrolled is also discussed.   [name] is reminded of the importance of daily foot exam, annual eye examination, and good blood sugar, blood pressure and cholesterol control.  Diabetic Labs Latest Ref Rng & Units 09/20/2021 06/13/2021 03/04/2021 11/26/2020 11/14/2020  HbA1c 4.8 - 5.6 % 7.8(H) 8.3(H) 8.3(H) 8.6(H) -  Microalbumin mg/dL - - - - -  Micro/Creat Ratio <30 mcg/mg creat - - - - -  Chol 100 - 199 mg/dL 785 - 885 - -  HDL >02 mg/dL 64 - 59 - -  Calc LDL 0 - 99 mg/dL 79 - 85 - -  Triglycerides 0 - 149 mg/dL 82 - 80 - -  Creatinine 0.57 - 1.00 mg/dL 7.74 1.28 7.86(V) - 6.72(C)   BP/Weight 09/24/2021 09/04/2021 08/15/2021 08/02/2021 06/20/2021 01/24/2021 11/15/2020  Systolic BP 141 - 138 135 133 947 136  Diastolic BP 81 - 096 81 77 75 84  Wt. (Lbs) 235.12 234 234 232.12 232 235 240.12  BMI 41.65 41.45 41.45 41.12 41.76 42.3 42.2   Foot/eye exam completion dates Latest Ref Rng & Units 11/15/2020 09/12/2019  Eye Exam No Retinopathy - -  Foot Form Completion - Done Done

## 2021-09-24 NOTE — Assessment & Plan Note (Signed)
DASH diet and commitment to daily physical activity for a minimum of 30 minutes discussed and encouraged, as a part of hypertension management. The importance of attaining a healthy weight is also discussed.  BP/Weight 09/24/2021 09/04/2021 08/15/2021 08/02/2021 06/20/2021 01/24/2021 11/15/2020  Systolic BP 141 - 138 135 133 505 136  Diastolic BP 81 - 697 81 77 75 84  Wt. (Lbs) 235.12 234 234 232.12 232 235 240.12  BMI 41.65 41.45 41.45 41.12 41.76 42.3 42.2  plan to ic med dose if elevated SBP persits

## 2021-09-24 NOTE — Assessment & Plan Note (Signed)
Hyperlipidemia:Low fat diet discussed and encouraged.   Lipid Panel  Lab Results  Component Value Date   CHOL 158 09/20/2021   HDL 64 09/20/2021   LDLCALC 79 09/20/2021   TRIG 82 09/20/2021   CHOLHDL 2.7 03/04/2021   Controlled, no change in medication

## 2021-09-24 NOTE — Telephone Encounter (Signed)
Patient called said Dr Lodema Hong can send both prescription to [facility], the pharmacy did not get I and patient does not want to drive no 60 miles just to get one prescription.   Please give patient call back [phone].  Rx #: [id number]  tirzepatide [facility]) 2.5 MG/0.5ML Pen  glipiZIDE (GLIPIZIDE XL) 2.5 MG 24 hr tablet

## 2021-09-24 NOTE — Assessment & Plan Note (Signed)
No current or recent flare 

## 2021-09-24 NOTE — Patient Instructions (Signed)
F/u in 4 weeks,  with blood sugar log, call if you need me sooner  Congrats on improved labs  Please schedule Jan 23 mammogram at checkout   New for diabetes is mounjaro inject  2.5 mg once weekly for next 4 weeks ( provide coupon please)   Start glipizide 2.5 mg TWO tablets every morning, stop glpiizide 10 mg tablet   Test FBG evety morning, goal range is 80 to 130  New for kidney protection and blood sugar is olmesartan 20 mg daily  Need covid vaccine/ booster, please get this at your pharmacy It is important that you exercise regularly at least 30 minutes 5 times a week. If you develop chest pain, have severe difficulty breathing, or feel very tired, stop exercising immediately and seek medical attention   Think about what you will eat, plan ahead. Choose " clean, green, fresh or frozen" over canned, processed or packaged foods which are more sugary, salty and fatty. 70 to 75% of food eaten should be vegetables and fruit. Three meals at set times with snacks allowed between meals, but they must be fruit or vegetables. Aim to eat over a 12 hour period , example 7 am to 7 pm, and STOP after  your last meal of the day. Drink water,generally about 64 ounces per day, no other drink is as healthy. Fruit juice is best enjoyed in a healthy way, by EATING the fruit. Thanks for choosing [facility], we consider it a privelige to serve you.

## 2021-09-24 NOTE — Progress Notes (Signed)
Tina Wilkins     MRN: [id number]      DOB: 09-29-1956   HPI Tina Wilkins is here for follow up and re-evaluation of chronic medical conditions, medication management and review of any available recent lab and radiology data.  Preventive health is updated, specifically  Cancer screening and Immunization.   Questions or concerns regarding consultations or procedures which the PT has had in the interim are  addressed. The PT denies any adverse reactions to current medications since the last visit.  There are no new concerns.  There are no specific complaints  Denies polyuria, polydipsia, blurred vision , or hypoglycemic episodes.   ROS Denies recent fever or chills. Denies sinus pressure, nasal congestion, ear pain or sore throat. Denies chest congestion, productive cough or wheezing. Denies chest pains, palpitations and leg swelling Denies abdominal pain, nausea, vomiting,diarrhea or constipation.   Denies dysuria, frequency, hesitancy or incontinence. Denies joint pain, swelling and limitation in mobility. Denies headaches, seizures, numbness, or tingling. Denies depression, anxiety or insomnia. Denies skin break down or rash.   PE  BP (!) 141/81   Pulse 66   Resp 18   Wt 235 lb 1.9 oz (106.6 kg)   SpO2 95%   BMI 41.65 kg/m   Patient alert and oriented and in no cardiopulmonary distress.  HEENT: No facial asymmetry, EOMI,     Neck supple .  Chest: Clear to auscultation bilaterally.  CVS: S1, S2 no murmurs, no S3.Regular rate.  ABD: Soft non tender.   Ext: No edema  MS: Adequate ROM spine, shoulders, hips and knees.  Skin: Intact, no ulcerations or rash noted.  Psych: Good eye contact, normal affect. Memory intact not anxious or depressed appearing.  CNS: CN 2-12 intact, power,  normal throughout.no focal deficits noted.   Assessment & Plan  Type 2 diabetes mellitus with vascular disease (HCC) Improved but still not at goal, start mounjaro if covered and  reduce glipizide Tina Wilkins is reminded of the importance of commitment to daily physical activity for 30 minutes or more, as able and the need to limit carbohydrate intake to 30 to 60 grams per meal to help with blood sugar control.   The need to take medication as prescribed, test blood sugar as directed, and to call between visits if there is a concern that blood sugar is uncontrolled is also discussed.   Tina Wilkins is reminded of the importance of daily foot exam, annual eye examination, and good blood sugar, blood pressure and cholesterol control.  Diabetic Labs Latest Ref Rng & Units 09/20/2021 06/13/2021 03/04/2021 11/26/2020 11/14/2020  HbA1c 4.8 - 5.6 % 7.8(H) 8.3(H) 8.3(H) 8.6(H) -  Microalbumin mg/dL - - - - -  Micro/Creat Ratio <30 mcg/mg creat - - - - -  Chol 100 - 199 mg/dL 169 - 678 - -  HDL >93 mg/dL 64 - 59 - -  Calc LDL 0 - 99 mg/dL 79 - 85 - -  Triglycerides 0 - 149 mg/dL 82 - 80 - -  Creatinine 0.57 - 1.00 mg/dL 8.10 1.75 1.02(H) - 8.52(D)   BP/Weight 09/24/2021 09/04/2021 08/15/2021 08/02/2021 06/20/2021 01/24/2021 11/15/2020  Systolic BP 141 - 138 135 133 782 136  Diastolic BP 81 - 423 81 77 75 84  Wt. (Lbs) 235.12 234 234 232.12 232 235 240.12  BMI 41.65 41.45 41.45 41.12 41.76 42.3 42.2   Foot/eye exam completion dates Latest Ref Rng & Units 11/15/2020 09/12/2019  Eye Exam No Retinopathy - -  Foot Form Completion - Done Done        Morbid obesity  Patient re-educated about  the importance of commitment to a  minimum of 150 minutes of exercise per week as able.  The importance of healthy food choices with portion control discussed, as well as eating regularly and within a 12 hour window most days. The need to choose "clean , green" food 50 to 75% of the time is discussed, as well as to make water the primary drink and set a goal of 64 ounces water daily.    Weight /BMI 09/24/2021 09/04/2021 08/15/2021  WEIGHT 235 lb 1.9 oz 234 lb 234 lb  HEIGHT - 5\' 3"  5\' 3"    BMI 41.65 kg/m2 41.45 kg/m2 41.45 kg/m2      Hyperlipidemia Hyperlipidemia:Low fat diet discussed and encouraged.   Lipid Panel  Lab Results  Component Value Date   CHOL 158 09/20/2021   HDL 64 09/20/2021   LDLCALC 79 09/20/2021   TRIG 82 09/20/2021   CHOLHDL 2.7 03/04/2021   Controlled, no change in medication     Allergic rhinitis No current or recent flare  Vitamin D deficiency contiue weekly supplement , re veal in 2023  Hypertension goal BP (blood pressure) < 130/80 DASH diet and commitment to daily physical activity for a minimum of 30 minutes discussed and encouraged, as a part of hypertension management. The importance of attaining a healthy weight is also discussed.  BP/Weight 09/24/2021 09/04/2021 08/15/2021 08/02/2021 06/20/2021 01/24/2021 11/15/2020  Systolic BP 141 - 138 135 133 01/26/2021 136  Diastolic BP 81 - 11/17/2020 81 77 75 84  Wt. (Lbs) 235.12 234 234 232.12 232 235 240.12  BMI 41.65 41.45 41.45 41.12 41.76 42.3 42.2  plan to ic med dose if elevated SBP persits

## 2021-09-25 ENCOUNTER — Other Ambulatory Visit: Payer: Self-pay

## 2021-09-25 MED ORDER — TIRZEPATIDE 2.5 MG/0.5ML ~~LOC~~ SOAJ: 2.5000 mg | SUBCUTANEOUS | 1 refills | Status: DC

## 2021-09-25 MED ORDER — GLIPIZIDE ER 2.5 MG PO TB24: ORAL_TABLET | ORAL | 5 refills | Status: DC

## 2021-09-25 NOTE — Telephone Encounter (Signed)
SENT TO WM AND LVM THAT IT WAS DONE

## 2021-10-07 ENCOUNTER — Other Ambulatory Visit (HOSPITAL_BASED_OUTPATIENT_CLINIC_OR_DEPARTMENT_OTHER): Payer: Self-pay

## 2021-10-07 ENCOUNTER — Other Ambulatory Visit: Payer: Self-pay | Admitting: Cardiology

## 2021-10-07 MED ORDER — METOPROLOL SUCCINATE ER 25 MG PO TB24
ORAL_TABLET | Freq: Every day | ORAL | 6 refills | Status: DC
  Filled 2021-10-07 (×2): qty 30, 30d supply, fill #0
  Filled 2021-11-03: qty 30, 30d supply, fill #1
  Filled 2021-12-05: qty 30, 30d supply, fill #2

## 2021-10-29 ENCOUNTER — Ambulatory Visit (INDEPENDENT_AMBULATORY_CARE_PROVIDER_SITE_OTHER): Payer: Medicare Other | Admitting: Family Medicine

## 2021-10-29 ENCOUNTER — Encounter: Payer: Self-pay | Admitting: Family Medicine

## 2021-10-29 ENCOUNTER — Other Ambulatory Visit: Payer: Self-pay

## 2021-10-29 VITALS — BP 110/82 | HR 68 | Ht 63.0 in | Wt 232.0 lb

## 2021-10-29 DIAGNOSIS — I10 Essential (primary) hypertension: Secondary | ICD-10-CM | POA: Diagnosis not present

## 2021-10-29 DIAGNOSIS — E1159 Type 2 diabetes mellitus with other circulatory complications: Secondary | ICD-10-CM

## 2021-10-29 DIAGNOSIS — E782 Mixed hyperlipidemia: Secondary | ICD-10-CM | POA: Diagnosis not present

## 2021-10-29 NOTE — Patient Instructions (Addendum)
F/U in mid February, call if you need me before  Increase glipizide 2.5 mg to THREE tablets every morning UNTIL I let you know ( hopefully today) what medication is being prescribed in place of the [facility]  Non fasting chem 7 and EGFr, hBA1C and microalb 3 days before next visit  It is important that you exercise regularly at least 30 minutes 5 times a week. If you develop chest pain, have severe difficulty breathing, or feel very tired, stop exercising immediately and seek medical attention      Thanks for choosing [facility], we consider it a privelige to serve you.

## 2021-10-30 ENCOUNTER — Encounter: Payer: Self-pay | Admitting: Family Medicine

## 2021-10-30 ENCOUNTER — Other Ambulatory Visit (HOSPITAL_BASED_OUTPATIENT_CLINIC_OR_DEPARTMENT_OTHER): Payer: Self-pay

## 2021-10-30 MED ORDER — SEMAGLUTIDE (1 MG/DOSE) 4 MG/3ML ~~LOC~~ SOPN
1.0000 mg | PEN_INJECTOR | SUBCUTANEOUS | 0 refills | Status: DC
  Filled 2021-10-30: qty 3, 28d supply, fill #0

## 2021-10-30 MED ORDER — SEMAGLUTIDE (2 MG/DOSE) 8 MG/3ML ~~LOC~~ SOPN
2.0000 mg | PEN_INJECTOR | SUBCUTANEOUS | 1 refills | Status: DC
  Filled 2021-10-30: qty 3, fill #0
  Filled 2021-12-05: qty 3, 28d supply, fill #0
  Filled 2022-01-09: qty 3, 28d supply, fill #1

## 2021-10-30 NOTE — Progress Notes (Signed)
Tina Wilkins     MRN: [id number]      DOB: Feb 21, 1956   HPI Tina Wilkins is here for follow up uncontrolled diabetes, HTN and obesity Continues to report improvement in blood sugar, has log and avg FBG is 145 to 150, was unable to fill mounjaro will need to see what her ins will cover Blood pressure checked regularly at home and readings are good ROS Denies recent fever or chills. Denies sinus pressure, nasal congestion, ear pain or sore throat. Denies chest congestion, productive cough or wheezing. Denies chest pains, palpitations and leg swelling Denies abdominal pain, nausea, vomiting,diarrhea or constipation.   Denies dysuria, frequency, hesitancy or incontinence. Denies uncontrolled  joint pain, swelling and limitation in mobility. Denies headaches, seizures, numbness, or tingling. Denies depression, anxiety or insomnia. Denies skin break down or rash.   PE  BP 110/82   Pulse 68   Ht 5\' 3"  (1.6 m)   Wt 232 lb 0.6 oz (105.3 kg)   SpO2 96%   BMI 41.10 kg/m   Patient alert and oriented and in no cardiopulmonary distress.  HEENT: No facial asymmetry, EOMI,     Neck supple .  Chest: Clear to auscultation bilaterally.  CVS: S1, S2 no murmurs, no S3.Regular rate.  ABD: Soft non tender.   Ext: No edema  MS: Adequate ROM spine, shoulders, hips and knees.  Skin: Intact, no ulcerations or rash noted.  Psych: Good eye contact, normal affect. Memory intact not anxious or depressed appearing.  CNS: CN 2-12 intact, power,  normal throughout.no focal deficits noted.   Assessment & Plan  Hypertension goal BP (blood pressure) < 130/80 Controlled, no change in medication DASH diet and commitment to daily physical activity for a minimum of 30 minutes discussed and encouraged, as a part of hypertension management. The importance of attaining a healthy weight is also discussed.  BP/Weight 10/29/2021 09/24/2021 09/04/2021 08/15/2021 08/02/2021 06/20/2021 01/24/2021  Systolic BP  110 01/26/2021 - 138 135 133 112  Diastolic BP 82 81 - 100 81 77 75  Wt. (Lbs) 232.04 235.12 234 234 232.12 232 235  BMI 41.1 41.65 41.45 41.45 41.12 41.76 42.3       Morbid obesity  Patient re-educated about  the importance of commitment to a  minimum of 150 minutes of exercise per week as able.  The importance of healthy food choices with portion control discussed, as well as eating regularly and within a 12 hour window most days. The need to choose "clean , green" food 50 to 75% of the time is discussed, as well as to make water the primary drink and set a goal of 64 ounces water daily.    Weight /BMI 10/29/2021 09/24/2021 09/04/2021  WEIGHT 232 lb 0.6 oz 235 lb 1.9 oz 234 lb  HEIGHT 5\' 3"  5\' 3"  5\' 3"   BMI 41.1 kg/m2 41.65 kg/m2 41.45 kg/m2      Type 2 diabetes mellitus with vascular disease (HCC) Tina Wilkins is reminded of the importance of commitment to daily physical activity for 30 minutes or more, as able and the need to limit carbohydrate intake to 30 to 60 grams per meal to help with blood sugar control.   The need to take medication as prescribed, test blood sugar as directed, and to call between visits if there is a concern that blood sugar is uncontrolled is also discussed.   Tina Wilkins is reminded of the importance of daily foot exam, annual eye examination, and good blood sugar,  blood pressure and cholesterol control.  Diabetic Labs Latest Ref Rng & Units 09/20/2021 06/13/2021 03/04/2021 11/26/2020 11/14/2020  HbA1c 4.8 - 5.6 % 7.8(H) 8.3(H) 8.3(H) 8.6(H) -  Microalbumin mg/dL - - - - -  Micro/Creat Ratio <30 mcg/mg creat - - - - -  Chol 100 - 199 mg/dL 932 - 671 - -  HDL >24 mg/dL 64 - 59 - -  Calc LDL 0 - 99 mg/dL 79 - 85 - -  Triglycerides 0 - 149 mg/dL 82 - 80 - -  Creatinine 0.57 - 1.00 mg/dL 5.80 9.98 3.38(S) - 5.05(L)   BP/Weight 10/29/2021 09/24/2021 09/04/2021 08/15/2021 08/02/2021 06/20/2021 01/24/2021  Systolic BP 110 141 - 138 135 976 112  Diastolic BP 82 81 - 100  81 77 75  Wt. (Lbs) 232.04 235.12 234 234 232.12 232 235  BMI 41.1 41.65 41.45 41.45 41.12 41.76 42.3   Foot/eye exam completion dates Latest Ref Rng & Units 11/15/2020 09/12/2019  Eye Exam No Retinopathy - -  Foot Form Completion - Done Done        Hyperlipidemia Hyperlipidemia:Low fat diet discussed and encouraged.   Lipid Panel  Lab Results  Component Value Date   CHOL 158 09/20/2021   HDL 64 09/20/2021   LDLCALC 79 09/20/2021   TRIG 82 09/20/2021   CHOLHDL 2.7 03/04/2021   Controlled, no change in medication

## 2021-10-30 NOTE — Assessment & Plan Note (Signed)
  Patient re-educated about  the importance of commitment to a  minimum of 150 minutes of exercise per week as able.  The importance of healthy food choices with portion control discussed, as well as eating regularly and within a 12 hour window most days. The need to choose "clean , green" food 50 to 75% of the time is discussed, as well as to make water the primary drink and set a goal of 64 ounces water daily.    Weight /BMI 10/29/2021 09/24/2021 09/04/2021  WEIGHT 232 lb 0.6 oz 235 lb 1.9 oz 234 lb  HEIGHT 5\' 3"  5\' 3"  5\' 3"   BMI 41.1 kg/m2 41.65 kg/m2 41.45 kg/m2

## 2021-10-30 NOTE — Assessment & Plan Note (Signed)
Controlled, no change in medication DASH diet and commitment to daily physical activity for a minimum of 30 minutes discussed and encouraged, as a part of hypertension management. The importance of attaining a healthy weight is also discussed.  BP/Weight 10/29/2021 09/24/2021 09/04/2021 08/15/2021 08/02/2021 06/20/2021 01/24/2021  Systolic BP 110 141 - 138 135 449 112  Diastolic BP 82 81 - 100 81 77 75  Wt. (Lbs) 232.04 235.12 234 234 232.12 232 235  BMI 41.1 41.65 41.45 41.45 41.12 41.76 42.3

## 2021-10-30 NOTE — Assessment & Plan Note (Signed)
Hyperlipidemia:Low fat diet discussed and encouraged.   Lipid Panel  Lab Results  Component Value Date   CHOL 158 09/20/2021   HDL 64 09/20/2021   LDLCALC 79 09/20/2021   TRIG 82 09/20/2021   CHOLHDL 2.7 03/04/2021   Controlled, no change in medication

## 2021-10-30 NOTE — Assessment & Plan Note (Signed)
Tina Wilkins is reminded of the importance of commitment to daily physical activity for 30 minutes or more, as able and the need to limit carbohydrate intake to 30 to 60 grams per meal to help with blood sugar control.   The need to take medication as prescribed, test blood sugar as directed, and to call between visits if there is a concern that blood sugar is uncontrolled is also discussed.   Tina Wilkins is reminded of the importance of daily foot exam, annual eye examination, and good blood sugar, blood pressure and cholesterol control.  Diabetic Labs Latest Ref Rng & Units 09/20/2021 06/13/2021 03/04/2021 11/26/2020 11/14/2020  HbA1c 4.8 - 5.6 % 7.8(H) 8.3(H) 8.3(H) 8.6(H) -  Microalbumin mg/dL - - - - -  Micro/Creat Ratio <30 mcg/mg creat - - - - -  Chol 100 - 199 mg/dL 086 - 578 - -  HDL >46 mg/dL 64 - 59 - -  Calc LDL 0 - 99 mg/dL 79 - 85 - -  Triglycerides 0 - 149 mg/dL 82 - 80 - -  Creatinine 0.57 - 1.00 mg/dL 9.62 9.52 8.41(L) - 2.44(W)   BP/Weight 10/29/2021 09/24/2021 09/04/2021 08/15/2021 08/02/2021 06/20/2021 01/24/2021  Systolic BP 110 141 - 138 135 102 112  Diastolic BP 82 81 - 100 81 77 75  Wt. (Lbs) 232.04 235.12 234 234 232.12 232 235  BMI 41.1 41.65 41.45 41.45 41.12 41.76 42.3   Foot/eye exam completion dates Latest Ref Rng & Units 11/15/2020 09/12/2019  Eye Exam No Retinopathy - -  Foot Form Completion - Done Done

## 2021-11-03 ENCOUNTER — Other Ambulatory Visit: Payer: Self-pay

## 2021-11-04 ENCOUNTER — Other Ambulatory Visit (HOSPITAL_BASED_OUTPATIENT_CLINIC_OR_DEPARTMENT_OTHER): Payer: Self-pay

## 2021-11-05 ENCOUNTER — Other Ambulatory Visit (HOSPITAL_BASED_OUTPATIENT_CLINIC_OR_DEPARTMENT_OTHER): Payer: Self-pay

## 2021-11-06 ENCOUNTER — Other Ambulatory Visit (HOSPITAL_BASED_OUTPATIENT_CLINIC_OR_DEPARTMENT_OTHER): Payer: Self-pay

## 2021-11-07 ENCOUNTER — Other Ambulatory Visit (HOSPITAL_BASED_OUTPATIENT_CLINIC_OR_DEPARTMENT_OTHER): Payer: Self-pay

## 2021-11-07 MED ORDER — LUMIGAN 0.01 % OP SOLN
1.0000 [drp] | Freq: Every day | OPHTHALMIC | 3 refills | Status: DC
  Filled 2021-11-07: qty 7.5, 90d supply, fill #0
  Filled 2022-06-20: qty 2.5, 25d supply, fill #0
  Filled 2022-07-16: qty 2.5, 25d supply, fill #1

## 2021-12-05 ENCOUNTER — Other Ambulatory Visit: Payer: Self-pay

## 2021-12-05 ENCOUNTER — Other Ambulatory Visit (HOSPITAL_BASED_OUTPATIENT_CLINIC_OR_DEPARTMENT_OTHER): Payer: Self-pay

## 2021-12-05 ENCOUNTER — Other Ambulatory Visit: Payer: Self-pay | Admitting: Family Medicine

## 2021-12-05 MED ORDER — ROSUVASTATIN CALCIUM 40 MG PO TABS
ORAL_TABLET | Freq: Every day | ORAL | 2 refills | Status: DC
  Filled 2021-12-05: qty 90, fill #0
  Filled 2022-01-09: qty 90, 90d supply, fill #0
  Filled 2022-04-16: qty 90, 90d supply, fill #1
  Filled 2022-07-16: qty 90, 90d supply, fill #2

## 2021-12-05 MED ORDER — CLONIDINE HCL 0.3 MG PO TABS
ORAL_TABLET | Freq: Every day | ORAL | 6 refills | Status: DC
  Filled 2021-12-05: qty 30, 30d supply, fill #0
  Filled 2022-01-09 (×2): qty 30, 30d supply, fill #1
  Filled 2022-02-09: qty 30, 30d supply, fill #2
  Filled 2022-03-16: qty 30, 30d supply, fill #3
  Filled 2022-04-17: qty 30, 30d supply, fill #4
  Filled 2022-05-18: qty 30, 30d supply, fill #5
  Filled 2022-06-19: qty 30, 30d supply, fill #6

## 2021-12-06 ENCOUNTER — Other Ambulatory Visit (HOSPITAL_BASED_OUTPATIENT_CLINIC_OR_DEPARTMENT_OTHER): Payer: Self-pay

## 2021-12-11 ENCOUNTER — Other Ambulatory Visit (HOSPITAL_COMMUNITY): Payer: Self-pay

## 2021-12-23 ENCOUNTER — Other Ambulatory Visit: Payer: Self-pay

## 2021-12-23 ENCOUNTER — Ambulatory Visit (HOSPITAL_COMMUNITY)
Admission: RE | Admit: 2021-12-23 | Discharge: 2021-12-23 | Disposition: A | Discharge status: Home / Self Care | Payer: Medicare Other | Source: Ambulatory Visit | Attending: Family Medicine | Admitting: Family Medicine

## 2021-12-23 DIAGNOSIS — Z1231 Encounter for screening mammogram for malignant neoplasm of breast: Secondary | ICD-10-CM | POA: Insufficient documentation

## 2021-12-26 ENCOUNTER — Other Ambulatory Visit: Payer: Self-pay

## 2021-12-26 MED ORDER — METOPROLOL SUCCINATE ER 25 MG PO TB24: ORAL_TABLET | Freq: Every day | ORAL | 3 refills | Status: DC

## 2021-12-26 NOTE — Telephone Encounter (Signed)
[facility] requested 90 day refill for toprol 25 mg qd, e-escribed per request

## 2022-01-09 ENCOUNTER — Other Ambulatory Visit: Payer: Self-pay | Admitting: Cardiology

## 2022-01-09 ENCOUNTER — Other Ambulatory Visit (HOSPITAL_BASED_OUTPATIENT_CLINIC_OR_DEPARTMENT_OTHER): Payer: Self-pay

## 2022-01-10 ENCOUNTER — Other Ambulatory Visit (HOSPITAL_BASED_OUTPATIENT_CLINIC_OR_DEPARTMENT_OTHER): Payer: Self-pay

## 2022-01-10 ENCOUNTER — Other Ambulatory Visit: Payer: Self-pay

## 2022-01-10 MED ORDER — METOPROLOL SUCCINATE ER 25 MG PO TB24
ORAL_TABLET | Freq: Every day | ORAL | 3 refills | Status: DC
  Filled 2022-01-10: qty 90, 90d supply, fill #0
  Filled 2022-04-17: qty 90, 90d supply, fill #1
  Filled 2022-07-16: qty 90, 90d supply, fill #2
  Filled 2022-10-15: qty 30, 30d supply, fill #3
  Filled 2022-10-17: qty 90, 90d supply, fill #3

## 2022-01-11 LAB — BMP8+EGFR
BUN/Creatinine Ratio: 14 (ref 12–28)
BUN: 20 mg/dL (ref 8–27)
CO2: 27 mmol/L (ref 20–29)
Calcium: 9.9 mg/dL (ref 8.7–10.3)
Chloride: 98 mmol/L (ref 96–106)
Creatinine, Ser: 1.48 mg/dL — ABNORMAL HIGH (ref 0.57–1.00)
Glucose: 93 mg/dL (ref 70–99)
Potassium: 3.4 mmol/L — ABNORMAL LOW (ref 3.5–5.2)
Sodium: 142 mmol/L (ref 134–144)
eGFR: 39 mL/min/{1.73_m2} — ABNORMAL LOW (ref >59)

## 2022-01-11 LAB — MICROALBUMIN / CREATININE URINE RATIO
Creatinine, Urine: 133.9 mg/dL
Microalb/Creat Ratio: 82 mg/g creat — ABNORMAL HIGH (ref 0–29)
Microalbumin, Urine: 109.5 ug/mL

## 2022-01-11 LAB — HEMOGLOBIN A1C
Est. average glucose Bld gHb Est-mCnc: 151 mg/dL
Hgb A1c MFr Bld: 6.9 % — ABNORMAL HIGH (ref 4.8–5.6)

## 2022-01-14 ENCOUNTER — Other Ambulatory Visit (HOSPITAL_COMMUNITY): Payer: Self-pay

## 2022-01-15 ENCOUNTER — Encounter: Payer: Self-pay | Admitting: Family Medicine

## 2022-01-15 ENCOUNTER — Other Ambulatory Visit (HOSPITAL_BASED_OUTPATIENT_CLINIC_OR_DEPARTMENT_OTHER): Payer: Self-pay

## 2022-01-15 ENCOUNTER — Ambulatory Visit (INDEPENDENT_AMBULATORY_CARE_PROVIDER_SITE_OTHER): Payer: Medicare Other | Admitting: Family Medicine

## 2022-01-15 ENCOUNTER — Other Ambulatory Visit: Payer: Self-pay

## 2022-01-15 VITALS — BP 130/80 | HR 78 | Ht 63.0 in | Wt 226.1 lb

## 2022-01-15 DIAGNOSIS — I10 Essential (primary) hypertension: Secondary | ICD-10-CM | POA: Diagnosis not present

## 2022-01-15 DIAGNOSIS — E1159 Type 2 diabetes mellitus with other circulatory complications: Secondary | ICD-10-CM

## 2022-01-15 DIAGNOSIS — N179 Acute kidney failure, unspecified: Secondary | ICD-10-CM | POA: Diagnosis not present

## 2022-01-15 DIAGNOSIS — E782 Mixed hyperlipidemia: Secondary | ICD-10-CM

## 2022-01-15 MED ORDER — AMLODIPINE BESYLATE 2.5 MG PO TABS
2.5000 mg | ORAL_TABLET | Freq: Every day | ORAL | 0 refills | Status: DC
  Filled 2022-01-15 – 2022-02-11 (×2): qty 90, 90d supply, fill #0

## 2022-01-15 NOTE — Patient Instructions (Signed)
Follow-up in 4 to 5 weeks call if you need me sooner.  Congrats on excellent blood sugar and ongoing weight loss.  Reduce triamterene to half tablet daily and add amlodipine 2.5 mg daily for blood pressure control.  Nonfasting Chem-7 and EGFR 3 days before next visit.  Ensure that you drink at least 64 ounces of water daily.  It is important that you exercise regularly at least 30 minutes 5 times a week. If you develop chest pain, have severe difficulty breathing, or feel very tired, stop exercising immediately and seek medical attention  Think about what you will eat, plan ahead. Choose " clean, green, fresh or frozen" over canned, processed or packaged foods which are more sugary, salty and fatty. 70 to 75% of food eaten should be vegetables and fruit. Three meals at set times with snacks allowed between meals, but they must be fruit or vegetables. Aim to eat over a 12 hour period , example 7 am to 7 pm, and STOP after  your last meal of the day. Drink water,generally about 64 ounces per day, no other drink is as healthy. Fruit juice is best enjoyed in a healthy way, by EATING the fruit. Thanks for choosing [facility], we consider it a privelige to serve you.

## 2022-01-18 ENCOUNTER — Encounter: Payer: Self-pay | Admitting: Family Medicine

## 2022-01-18 DIAGNOSIS — N179 Acute kidney failure, unspecified: Secondary | ICD-10-CM | POA: Insufficient documentation

## 2022-01-18 NOTE — Assessment & Plan Note (Signed)
AKI, reduce triamterene dose , ensure 64 oz water , and re eval in 4 weeks, add amlodipine 2.5 mg daily DASH diet and commitment to daily physical activity for a minimum of 30 minutes discussed and encouraged, as a part of hypertension management. The importance of attaining a healthy weight is also discussed.  BP/Weight 01/15/2022 10/29/2021 09/24/2021 09/04/2021 08/15/2021 08/02/2021 06/20/2021  Systolic BP 130 110 141 - 138 161 133  Diastolic BP 80 82 81 - 100 81 77  Wt. (Lbs) 226.12 232.04 235.12 234 234 232.12 232  BMI 40.06 41.1 41.65 41.45 41.45 41.12 41.76

## 2022-01-18 NOTE — Assessment & Plan Note (Signed)
Improved , continue current med, controlled Tina Wilkins is reminded of the importance of commitment to daily physical activity for 30 minutes or more, as able and the need to limit carbohydrate intake to 30 to 60 grams per meal to help with blood sugar control.   The need to take medication as prescribed, test blood sugar as directed, and to call between visits if there is a concern that blood sugar is uncontrolled is also discussed.   Tina Wilkins is reminded of the importance of daily foot exam, annual eye examination, and good blood sugar, blood pressure and cholesterol control.  Diabetic Labs Latest Ref Rng & Units 01/09/2022 09/20/2021 06/13/2021 03/04/2021 11/26/2020  HbA1c 4.8 - 5.6 % 6.9(H) 7.8(H) 8.3(H) 8.3(H) 8.6(H)  Microalbumin mg/dL - - - - -  Micro/Creat Ratio 0 - 29 mg/g creat 82(H) - - - -  Chol 100 - 199 mg/dL - 354 - 656 -  HDL >81 mg/dL - 64 - 59 -  Calc LDL 0 - 99 mg/dL - 79 - 85 -  Triglycerides 0 - 149 mg/dL - 82 - 80 -  Creatinine 0.57 - 1.00 mg/dL 2.75(T) 7.00 1.74 9.44(H) -   BP/Weight 01/15/2022 10/29/2021 09/24/2021 09/04/2021 08/15/2021 08/02/2021 06/20/2021  Systolic BP 130 110 141 - 138 675 133  Diastolic BP 80 82 81 - 100 81 77  Wt. (Lbs) 226.12 232.04 235.12 234 234 232.12 232  BMI 40.06 41.1 41.65 41.45 41.45 41.12 41.76   Foot/eye exam completion dates Latest Ref Rng & Units 01/15/2022 11/15/2020  Eye Exam No Retinopathy - -  Foot Form Completion - Done Done

## 2022-01-18 NOTE — Assessment & Plan Note (Signed)
Hyperlipidemia:Low fat diet discussed and encouraged.   Lipid Panel  Lab Results  Component Value Date   CHOL 158 09/20/2021   HDL 64 09/20/2021   LDLCALC 79 09/20/2021   TRIG 82 09/20/2021   CHOLHDL 2.7 03/04/2021   Controlled, no change in medication    

## 2022-01-18 NOTE — Assessment & Plan Note (Signed)
Unclear etiology, reduce triamterene and add amlodipine, inc water to 64 oz/ day, reduce processed foods and avoid NSAIDS, re eval in 4 weeks, will refer to Nephrology if persists

## 2022-01-18 NOTE — Progress Notes (Signed)
Tina Wilkins     MRN: [id number]      DOB: 03-Nov-1956   HPI Tina Wilkins is here for follow up and re-evaluation of chronic medical conditions, medication management and review of any available recent lab and radiology data.  Preventive health is updated, specifically  Cancer screening and Immunization.   Questions or concerns regarding consultations or procedures which the PT has had in the interim are  addressed. The PT denies any adverse reactions to current medications since the last visit.  There are no new concerns.  There are no specific complaints  Denies polyuria, polydipsia, blurred vision , or hypoglycemic episodes.   ROS Denies recent fever or chills. Denies sinus pressure, nasal congestion, ear pain or sore throat. Denies chest congestion, productive cough or wheezing. Denies chest pains, palpitations and leg swelling Denies abdominal pain, nausea, vomiting,diarrhea or constipation.   Denies dysuria, frequency, hesitancy or incontinence. Denies joint pain, swelling and limitation in mobility. Denies headaches, seizures, numbness, or tingling. Denies depression, anxiety or insomnia. Denies skin break down or rash.   PE  BP 130/80    Pulse 78    Ht 5\' 3"  (1.6 m)    Wt 226 lb 1.9 oz (102.6 kg)    SpO2 96%    BMI 40.06 kg/m   Patient alert and oriented and in no cardiopulmonary distress.  HEENT: No facial asymmetry, EOMI,     Neck supple .  Chest: Clear to auscultation bilaterally.  CVS: S1, S2 no murmurs, no S3.Regular rate.  ABD: Soft non tender.   Ext: No edema  MS: Adequate ROM spine, shoulders, hips and knees.  Skin: Intact, no ulcerations or rash noted.  Psych: Good eye contact, normal affect. Memory intact not anxious or depressed appearing.  CNS: CN 2-12 intact, power,  normal throughout.no focal deficits noted.   Assessment & Plan  Type 2 diabetes mellitus with vascular disease (HCC) Improved , continue current med, controlled Tina Wilkins is  reminded of the importance of commitment to daily physical activity for 30 minutes or more, as able and the need to limit carbohydrate intake to 30 to 60 grams per meal to help with blood sugar control.   The need to take medication as prescribed, test blood sugar as directed, and to call between visits if there is a concern that blood sugar is uncontrolled is also discussed.   Tina Wilkins is reminded of the importance of daily foot exam, annual eye examination, and good blood sugar, blood pressure and cholesterol control.  Diabetic Labs Latest Ref Rng & Units 01/09/2022 09/20/2021 06/13/2021 03/04/2021 11/26/2020  HbA1c 4.8 - 5.6 % 6.9(H) 7.8(H) 8.3(H) 8.3(H) 8.6(H)  Microalbumin mg/dL - - - - -  Micro/Creat Ratio 0 - 29 mg/g creat 82(H) - - - -  Chol 100 - 199 mg/dL - 11/28/2020 - 818 -  HDL 563 mg/dL - 64 - 59 -  Calc LDL 0 - 99 mg/dL - 79 - 85 -  Triglycerides 0 - 149 mg/dL - 82 - 80 -  Creatinine 0.57 - 1.00 mg/dL >14) 9.70(Y 6.37 8.58) -   BP/Weight 01/15/2022 10/29/2021 09/24/2021 09/04/2021 08/15/2021 08/02/2021 06/20/2021  Systolic BP 130 110 141 - 138 06/22/2021 133  Diastolic BP 80 82 81 - 100 81 77  Wt. (Lbs) 226.12 232.04 235.12 234 234 232.12 232  BMI 40.06 41.1 41.65 41.45 41.45 41.12 41.76   Foot/eye exam completion dates Latest Ref Rng & Units 01/15/2022 11/15/2020  Eye Exam No Retinopathy - -  Foot Form Completion - Done Done        Hypertension goal BP (blood pressure) < 130/80 AKI, reduce triamterene dose , ensure 64 oz water , and re eval in 4 weeks, add amlodipine 2.5 mg daily DASH diet and commitment to daily physical activity for a minimum of 30 minutes discussed and encouraged, as a part of hypertension management. The importance of attaining a healthy weight is also discussed.  BP/Weight 01/15/2022 10/29/2021 09/24/2021 09/04/2021 08/15/2021 08/02/2021 06/20/2021  Systolic BP 130 110 141 - 138 876 133  Diastolic BP 80 82 81 - 100 81 77  Wt. (Lbs) 226.12 232.04 235.12 234 234 232.12  232  BMI 40.06 41.1 41.65 41.45 41.45 41.12 41.76       Morbid obesity Improved, gradual weight loss which is very encouraging, she is applauded on this  Patient re-educated about  the importance of commitment to a  minimum of 150 minutes of exercise per week as able.  The importance of healthy food choices with portion control discussed, as well as eating regularly and within a 12 hour window most days. The need to choose "clean , green" food 50 to 75% of the time is discussed, as well as to make water the primary drink and set a goal of 64 ounces water daily.    Weight /BMI 01/15/2022 10/29/2021 09/24/2021  WEIGHT 226 lb 1.9 oz 232 lb 0.6 oz 235 lb 1.9 oz  HEIGHT 5\' 3"  5\' 3"  5\' 3"   BMI 40.06 kg/m2 41.1 kg/m2 41.65 kg/m2      AKI (acute kidney injury) (HCC) Unclear etiology, reduce triamterene and add amlodipine, inc water to 64 oz/ day, reduce processed foods and avoid NSAIDS, re eval in 4 weeks, will refer to Nephrology if persists  Hyperlipidemia Hyperlipidemia:Low fat diet discussed and encouraged.   Lipid Panel  Lab Results  Component Value Date   CHOL 158 09/20/2021   HDL 64 09/20/2021   LDLCALC 79 09/20/2021   TRIG 82 09/20/2021   CHOLHDL 2.7 03/04/2021     Controlled, no change in medication

## 2022-01-18 NOTE — Assessment & Plan Note (Signed)
Improved, gradual weight loss which is very encouraging, she is applauded on this  Patient re-educated about  the importance of commitment to a  minimum of 150 minutes of exercise per week as able.  The importance of healthy food choices with portion control discussed, as well as eating regularly and within a 12 hour window most days. The need to choose "clean , green" food 50 to 75% of the time is discussed, as well as to make water the primary drink and set a goal of 64 ounces water daily.    Weight /BMI 01/15/2022 10/29/2021 09/24/2021  WEIGHT 226 lb 1.9 oz 232 lb 0.6 oz 235 lb 1.9 oz  HEIGHT 5\' 3"  5\' 3"  5\' 3"   BMI 40.06 kg/m2 41.1 kg/m2 41.65 kg/m2

## 2022-01-20 ENCOUNTER — Other Ambulatory Visit (HOSPITAL_BASED_OUTPATIENT_CLINIC_OR_DEPARTMENT_OTHER): Payer: Self-pay

## 2022-01-23 ENCOUNTER — Other Ambulatory Visit (HOSPITAL_BASED_OUTPATIENT_CLINIC_OR_DEPARTMENT_OTHER): Payer: Self-pay

## 2022-02-03 ENCOUNTER — Other Ambulatory Visit (HOSPITAL_BASED_OUTPATIENT_CLINIC_OR_DEPARTMENT_OTHER): Payer: Self-pay

## 2022-02-03 ENCOUNTER — Other Ambulatory Visit: Payer: Self-pay | Admitting: Family Medicine

## 2022-02-03 MED ORDER — OZEMPIC (2 MG/DOSE) 8 MG/3ML ~~LOC~~ SOPN
2.0000 mg | PEN_INJECTOR | SUBCUTANEOUS | 1 refills | Status: DC
  Filled 2022-02-03: qty 3, 28d supply, fill #0

## 2022-02-08 LAB — BMP8+EGFR
BUN/Creatinine Ratio: 12 (ref 12–28)
BUN: 13 mg/dL (ref 8–27)
CO2: 26 mmol/L (ref 20–29)
Calcium: 10 mg/dL (ref 8.7–10.3)
Chloride: 101 mmol/L (ref 96–106)
Creatinine, Ser: 1.07 mg/dL — ABNORMAL HIGH (ref 0.57–1.00)
Glucose: 106 mg/dL — ABNORMAL HIGH (ref 70–99)
Potassium: 3.6 mmol/L (ref 3.5–5.2)
Sodium: 145 mmol/L — ABNORMAL HIGH (ref 134–144)
eGFR: 57 mL/min/{1.73_m2} — ABNORMAL LOW (ref >59)

## 2022-02-10 ENCOUNTER — Other Ambulatory Visit (HOSPITAL_BASED_OUTPATIENT_CLINIC_OR_DEPARTMENT_OTHER): Payer: Self-pay

## 2022-02-11 ENCOUNTER — Encounter (INDEPENDENT_AMBULATORY_CARE_PROVIDER_SITE_OTHER): Payer: Self-pay

## 2022-02-11 ENCOUNTER — Other Ambulatory Visit (HOSPITAL_BASED_OUTPATIENT_CLINIC_OR_DEPARTMENT_OTHER): Payer: Self-pay

## 2022-02-11 ENCOUNTER — Other Ambulatory Visit: Payer: Self-pay

## 2022-02-11 ENCOUNTER — Encounter: Payer: Self-pay | Admitting: Family Medicine

## 2022-02-11 ENCOUNTER — Ambulatory Visit (INDEPENDENT_AMBULATORY_CARE_PROVIDER_SITE_OTHER): Payer: Medicare Other | Admitting: Family Medicine

## 2022-02-11 VITALS — BP 150/79 | HR 75 | Ht 63.0 in | Wt 225.1 lb

## 2022-02-11 DIAGNOSIS — E782 Mixed hyperlipidemia: Secondary | ICD-10-CM

## 2022-02-11 DIAGNOSIS — I10 Essential (primary) hypertension: Secondary | ICD-10-CM | POA: Diagnosis not present

## 2022-02-11 DIAGNOSIS — E1159 Type 2 diabetes mellitus with other circulatory complications: Secondary | ICD-10-CM | POA: Diagnosis not present

## 2022-02-11 NOTE — Assessment & Plan Note (Signed)
Uncontrolled, does not have amlodipine, will pick up today ?DASH diet and commitment to daily physical activity for a minimum of 30 minutes discussed and encouraged, as a part of hypertension management. ?The importance of attaining a healthy weight is also discussed. ? ?BP/Weight 02/11/2022 01/15/2022 10/29/2021 09/24/2021 09/04/2021 08/15/2021 08/02/2021  ?Systolic BP 150 130 110 141 - 161 135  ?Diastolic BP 79 80 82 81 - 100 81  ?Wt. (Lbs) 225.08 226.12 232.04 235.12 234 234 232.12  ?BMI 39.87 40.06 41.1 41.65 41.45 41.45 41.12  ? ? ? ?Compliance need is stressed for daily compliance ?

## 2022-02-11 NOTE — Assessment & Plan Note (Addendum)
Tina Wilkins is reminded of the importance of commitment to daily physical activity for 30 minutes or more, as able and the need to limit carbohydrate intake to 30 to 60 grams per meal to help with blood sugar control.  ? ?The need to take medication as prescribed, test blood sugar as directed, and to call between visits if there is a concern that blood sugar is uncontrolled is also discussed.  ? ?Tina Wilkins is reminded of the importance of daily foot exam, annual eye examination, and good blood sugar, blood pressure and cholesterol control. ?Updated lab needed at/ before next visit. ?Stop ozempic per pt preference rept HBA1C  ? ?Diabetic Labs Latest Ref Rng & Units 02/07/2022 01/09/2022 09/20/2021 06/13/2021 03/04/2021  ?HbA1c 4.8 - 5.6 % - 6.9(H) 7.8(H) 8.3(H) 8.3(H)  ?Microalbumin mg/dL - - - - -  ?Micro/Creat Ratio 0 - 29 mg/g creat - 82(H) - - -  ?Chol 100 - 199 mg/dL - - 158 - 159  ?HDL >39 mg/dL - - 64 - 59  ?Calc LDL 0 - 99 mg/dL - - 79 - 85  ?Triglycerides 0 - 149 mg/dL - - 82 - 80  ?Creatinine 0.57 - 1.00 mg/dL 1.07(H) 1.48(H) 0.91 0.95 1.03(H)  ? ?BP/Weight 02/11/2022 01/15/2022 10/29/2021 09/24/2021 09/04/2021 08/15/2021 08/02/2021  ?Systolic BP [id number] [id number] - 138 135  ?Diastolic BP 79 80 82 81 - 100 81  ?Wt. (Lbs) 225.08 226.12 232.04 235.12 234 234 232.12  ?BMI 39.87 40.06 41.1 41.65 41.45 41.45 41.12  ? ?Foot/eye exam completion dates Latest Ref Rng & Units 01/15/2022 11/15/2020  ?Eye Exam No Retinopathy - -  ?Foot Form Completion - Done Done  ? ? ? ? ? ?

## 2022-02-11 NOTE — Patient Instructions (Signed)
F/U week of May 15 , call if you need me sooner ? ?Non fasting chem 7 and EGFr and hBa1C May 10 or shortly after ? ?Need amlodipine to get blood pessure controlled ? ?Follow low carb diet rich in vegetables and check blood sugar every day. If you decide to resume lower dose ozempic before next visit, please call, I wil;l prescribe ? ?It is important that you exercise regularly at least 30 minutes 5 times a week. If you develop chest pain, have severe difficulty breathing, or feel very tired, stop exercising immediately and seek medical attention  ? ? ?Thanks for choosing [facility], we consider it a privelige to serve you. ? ?

## 2022-02-11 NOTE — Progress Notes (Signed)
? ?Tina Wilkins     MRN: [id number]      DOB: Apr 29, 1956 ? ? ?HPI ?Tina Wilkins is here for follow up and re-evaluation of chronic medical conditions, medication management and review of any available recent lab and radiology data.  ?Preventive health is updated, specifically  Cancer screening and Immunization.   ?Questions or concerns regarding consultations or procedures which the PT has had in the interim are  addressed. ?The PT denies any adverse reactions to current medications since the last visit.  ? ?C/o excess nausea and diarrhea feeling sick and weak on higher dose of ozempic, has stopped it and no interest in resuming a lower dose now  ?Has not taken BP meds amlodipine for 2 days, out of it ?ROS ?Denies recent fever or chills. ?Denies sinus pressure, nasal congestion, ear pain or sore throat. ?Denies chest congestion, productive cough or wheezing. ?Denies chest pains, palpitations and leg swelling ?Denies abdominal pain, nausea, vomiting,diarrhea or constipation.   ?Denies dysuria, frequency, hesitancy or incontinence. ?Denies joint pain, swelling and limitation in mobility. ?Denies headaches, seizures, numbness, or tingling. ?Denies depression, anxiety or insomnia. ?Denies skin break down or rash. ? ? ?PE ? ?BP (!) 150/79   Pulse 75   Ht 5\' 3"  (1.6 m)   Wt 225 lb 1.3 oz (102.1 kg)   SpO2 95%   BMI 39.87 kg/m?  ? ?Patient alert and oriented and in no cardiopulmonary distress. ? ?HEENT: No facial asymmetry, EOMI,     Neck supple . ? ?Chest: Clear to auscultation bilaterally. ? ?CVS: S1, S2 no murmurs, no S3.Regular rate. ? ?ABD: Soft non tender.  ? ?Ext: No edema ? ?MS: Adequate ROM spine, shoulders, hips and knees. ? ?Skin: Intact, no ulcerations or rash noted. ? ?Psych: Good eye contact, normal affect. Memory intact not anxious or depressed appearing. ? ?CNS: CN 2-12 intact, power,  normal throughout.no focal deficits noted. ? ? ?Assessment & Plan ? ?Hypertension goal BP (blood pressure) <  130/80 ?Uncontrolled, does not have amlodipine, will pick up today ?DASH diet and commitment to daily physical activity for a minimum of 30 minutes discussed and encouraged, as a part of hypertension management. ?The importance of attaining a healthy weight is also discussed. ? ?BP/Weight 02/11/2022 01/15/2022 10/29/2021 09/24/2021 09/04/2021 08/15/2021 08/02/2021  ?Systolic BP 150 130 110 141 - 10/02/2021 135  ?Diastolic BP 79 80 82 81 - 100 81  ?Wt. (Lbs) 225.08 226.12 232.04 235.12 234 234 232.12  ?BMI 39.87 40.06 41.1 41.65 41.45 41.45 41.12  ? ? ? ?Compliance need is stressed for daily compliance ? ?Type 2 diabetes mellitus with vascular disease (HCC) ?Tina Wilkins is reminded of the importance of commitment to daily physical activity for 30 minutes or more, as able and the need to limit carbohydrate intake to 30 to 60 grams per meal to help with blood sugar control.  ? ?The need to take medication as prescribed, test blood sugar as directed, and to call between visits if there is a concern that blood sugar is uncontrolled is also discussed.  ? ?Tina Wilkins is reminded of the importance of daily foot exam, annual eye examination, and good blood sugar, blood pressure and cholesterol control. ?Updated lab needed at/ before next visit. ?Stop ozempic per pt preference rept HBA1C  ? ?Diabetic Labs Latest Ref Rng & Units 02/07/2022 01/09/2022 09/20/2021 06/13/2021 03/04/2021  ?HbA1c 4.8 - 5.6 % - 6.9(H) 7.8(H) 8.3(H) 8.3(H)  ?Microalbumin mg/dL - - - - -  ?Micro/Creat Ratio 0 -  29 mg/g creat - 82(H) - - -  ?Chol 100 - 199 mg/dL - - 150 - 569  ?HDL >39 mg/dL - - 64 - 59  ?Calc LDL 0 - 99 mg/dL - - 79 - 85  ?Triglycerides 0 - 149 mg/dL - - 82 - 80  ?Creatinine 0.57 - 1.00 mg/dL 7.94(I) 0.16(P) 5.37 4.82 1.03(H)  ? ?BP/Weight 02/11/2022 01/15/2022 10/29/2021 09/24/2021 09/04/2021 08/15/2021 08/02/2021  ?Systolic BP 150 130 110 141 - 707 135  ?Diastolic BP 79 80 82 81 - 100 81  ?Wt. (Lbs) 225.08 226.12 232.04 235.12 234 234 232.12  ?BMI 39.87 40.06  41.1 41.65 41.45 41.45 41.12  ? ?Foot/eye exam completion dates Latest Ref Rng & Units 01/15/2022 11/15/2020  ?Eye Exam No Retinopathy - -  ?Foot Form Completion - Done Done  ? ? ? ? ? ? ?Hyperlipidemia ?Hyperlipidemia:Low fat diet discussed and encouraged. ? ? ?Lipid Panel  ?Lab Results  ?Component Value Date  ? CHOL 158 09/20/2021  ? HDL 64 09/20/2021  ? LDLCALC 79 09/20/2021  ? TRIG 82 09/20/2021  ? CHOLHDL 2.7 03/04/2021  ? ? ? ?Updated lab needed at/ before next visit. ? ? ?Morbid obesity ? ?Patient re-educated about  the importance of commitment to a  minimum of 150 minutes of exercise per week as able. ? ?The importance of healthy food choices with portion control discussed, as well as eating regularly and within a 12 hour window most days. ?The need to choose "clean , green" food 50 to 75% of the time is discussed, as well as to make water the primary drink and set a goal of 64 ounces water daily. ? ?  ?Weight /BMI 02/11/2022 01/15/2022 10/29/2021  ?WEIGHT 225 lb 1.3 oz 226 lb 1.9 oz 232 lb 0.6 oz  ?HEIGHT 5\' 3"  5\' 3"  5\' 3"   ?BMI 39.87 kg/m2 40.06 kg/m2 41.1 kg/m2  ? ? ? ? ?

## 2022-02-11 NOTE — Assessment & Plan Note (Signed)
Hyperlipidemia:Low fat diet discussed and encouraged.   Lipid Panel  Lab Results  Component Value Date   CHOL 158 09/20/2021   HDL 64 09/20/2021   LDLCALC 79 09/20/2021   TRIG 82 09/20/2021   CHOLHDL 2.7 03/04/2021     Updated lab needed at/ before next visit.  

## 2022-02-11 NOTE — Assessment & Plan Note (Signed)
?  Patient re-educated about  the importance of commitment to a  minimum of 150 minutes of exercise per week as able. ? ?The importance of healthy food choices with portion control discussed, as well as eating regularly and within a 12 hour window most days. ?The need to choose "clean , green" food 50 to 75% of the time is discussed, as well as to make water the primary drink and set a goal of 64 ounces water daily. ? ?  ?Weight /BMI 02/11/2022 01/15/2022 10/29/2021  ?WEIGHT 225 lb 1.3 oz 226 lb 1.9 oz 232 lb 0.6 oz  ?HEIGHT 5\' 3"  5\' 3"  5\' 3"   ?BMI 39.87 kg/m2 40.06 kg/m2 41.1 kg/m2  ? ? ? ?

## 2022-02-14 ENCOUNTER — Other Ambulatory Visit (HOSPITAL_BASED_OUTPATIENT_CLINIC_OR_DEPARTMENT_OTHER): Payer: Self-pay

## 2022-03-17 ENCOUNTER — Other Ambulatory Visit (HOSPITAL_BASED_OUTPATIENT_CLINIC_OR_DEPARTMENT_OTHER): Payer: Self-pay

## 2022-03-20 ENCOUNTER — Ambulatory Visit: Payer: Medicare Other | Admitting: Family Medicine

## 2022-04-12 LAB — BMP8+EGFR
BUN/Creatinine Ratio: 17 (ref 12–28)
BUN: 21 mg/dL (ref 8–27)
CO2: 26 mmol/L (ref 20–29)
Calcium: 9.9 mg/dL (ref 8.7–10.3)
Chloride: 101 mmol/L (ref 96–106)
Creatinine, Ser: 1.24 mg/dL — ABNORMAL HIGH (ref 0.57–1.00)
Glucose: 165 mg/dL — ABNORMAL HIGH (ref 70–99)
Potassium: 3.4 mmol/L — ABNORMAL LOW (ref 3.5–5.2)
Sodium: 144 mmol/L (ref 134–144)
eGFR: 48 mL/min/{1.73_m2} — ABNORMAL LOW (ref >59)

## 2022-04-12 LAB — HEMOGLOBIN A1C
Est. average glucose Bld gHb Est-mCnc: 151 mg/dL
Hgb A1c MFr Bld: 6.9 % — ABNORMAL HIGH (ref 4.8–5.6)

## 2022-04-16 ENCOUNTER — Other Ambulatory Visit (HOSPITAL_BASED_OUTPATIENT_CLINIC_OR_DEPARTMENT_OTHER): Payer: Self-pay

## 2022-04-16 ENCOUNTER — Telehealth: Payer: Self-pay | Admitting: Family Medicine

## 2022-04-16 ENCOUNTER — Encounter: Payer: Self-pay | Admitting: Family Medicine

## 2022-04-16 ENCOUNTER — Ambulatory Visit (INDEPENDENT_AMBULATORY_CARE_PROVIDER_SITE_OTHER): Payer: Medicare Other | Admitting: Family Medicine

## 2022-04-16 VITALS — BP 144/86 | HR 68 | Ht 63.0 in | Wt 227.1 lb

## 2022-04-16 DIAGNOSIS — I10 Essential (primary) hypertension: Secondary | ICD-10-CM

## 2022-04-16 DIAGNOSIS — E1159 Type 2 diabetes mellitus with other circulatory complications: Secondary | ICD-10-CM | POA: Diagnosis not present

## 2022-04-16 DIAGNOSIS — K219 Gastro-esophageal reflux disease without esophagitis: Secondary | ICD-10-CM

## 2022-04-16 DIAGNOSIS — J309 Allergic rhinitis, unspecified: Secondary | ICD-10-CM

## 2022-04-16 DIAGNOSIS — E782 Mixed hyperlipidemia: Secondary | ICD-10-CM | POA: Diagnosis not present

## 2022-04-16 DIAGNOSIS — N179 Acute kidney failure, unspecified: Secondary | ICD-10-CM

## 2022-04-16 DIAGNOSIS — E559 Vitamin D deficiency, unspecified: Secondary | ICD-10-CM

## 2022-04-16 MED ORDER — TIRZEPATIDE 2.5 MG/0.5ML ~~LOC~~ SOAJ
2.5000 mg | SUBCUTANEOUS | 1 refills | Status: DC
  Filled 2022-04-16 (×2): qty 2, 28d supply, fill #0

## 2022-04-16 MED ORDER — SEMAGLUTIDE (1 MG/DOSE) 4 MG/3ML ~~LOC~~ SOPN
1.0000 mg | PEN_INJECTOR | SUBCUTANEOUS | 3 refills | Status: DC
  Filled 2022-04-16: qty 3, 28d supply, fill #0

## 2022-04-16 MED ORDER — AMLODIPINE BESYLATE 5 MG PO TABS
5.0000 mg | ORAL_TABLET | Freq: Every day | ORAL | 1 refills | Status: DC
  Filled 2022-04-16 – 2022-07-29 (×2): qty 90, 90d supply, fill #0

## 2022-04-16 NOTE — Progress Notes (Signed)
ELINA STRENG     MRN: [id number]      DOB: Dec 08, 1955   HPI Ms. Riecke is here for follow up and re-evaluation of chronic medical conditions, medication management and review of any available recent lab and radiology data.  Preventive health is updated, specifically  Cancer screening and Immunization.   Questions or concerns regarding consultations or procedures which the PT has had in the interim are  addressed. The PT denies any adverse reactions to current medications since the last visit.  There are no new concerns.  There are no specific complaints   ROS Denies recent fever or chills. Denies sinus pressure, nasal congestion, ear pain or sore throat. Denies chest congestion, productive cough or wheezing. Denies chest pains, palpitations and leg swelling Denies abdominal pain, nausea, vomiting,diarrhea or constipation.   Denies dysuria, frequency, hesitancy or incontinence. Denies joint pain, swelling and limitation in mobility. Denies headaches, seizures, numbness, or tingling. Denies depression, anxiety or insomnia. Denies skin break down or rash.   PE  BP (!) 144/86   Pulse 68   Ht 5' 3" (1.6 m)   Wt 227 lb 1.3 oz (103 kg)   SpO2 96%   BMI 40.23 kg/m   Patient alert and oriented and in no cardiopulmonary distress.  HEENT: No facial asymmetry, EOMI,     Neck supple .  Chest: Clear to auscultation bilaterally.  CVS: S1, S2 no murmurs, no S3.Regular rate.  ABD: Soft non tender.   Ext: No edema  MS: Adequate ROM spine, shoulders, hips and knees.  Skin: Intact, no ulcerations or rash noted.  Psych: Good eye contact, normal affect. Memory intact not anxious or depressed appearing.  CNS: CN 2-12 intact, power,  normal throughout.no focal deficits noted.   Assessment & Plan  Hypertension goal BP (blood pressure) < 130/80 Ucontrolled, dose inc in amlodipine DASH diet and commitment to daily physical activity for a minimum of 30 minutes discussed and  encouraged, as a part of hypertension management. The importance of attaining a healthy weight is also discussed.     04/16/2022   11:30 AM 04/16/2022   10:45 AM 02/11/2022    9:26 AM 01/15/2022    8:56 AM 10/29/2021   12:02 PM 10/29/2021   11:15 AM 09/24/2021   10:21 AM  BP/Weight  Systolic BP 389 373 428 768 115 726 203  Diastolic BP 86 83 79 80 82 81 81  Wt. (Lbs)  227.08 225.08 226.12  232.04 235.12  BMI  40.23 kg/m2 39.87 kg/m2 40.06 kg/m2  41.1 kg/m2 41.65 kg/m2       Type 2 diabetes mellitus with vascular disease ([place]) Updated lab needed at/ before next visit.  Ms. Rochin is reminded of the importance of commitment to daily physical activity for 30 minutes or more, as able and the need to limit carbohydrate intake to 30 to 60 grams per meal to help with blood sugar control.   The need to take medication as prescribed, test blood sugar as directed, and to call between visits if there is a concern that blood sugar is uncontrolled is also discussed.   Ms. Schuff is reminded of the importance of daily foot exam, annual eye examination, and good blood sugar, blood pressure and cholesterol control.     Latest Ref Rng & Units 04/11/2022    4:17 PM 02/07/2022    2:42 PM 01/09/2022   11:51 AM 09/20/2021    9:46 AM 06/13/2021    8:38 AM  Diabetic  Labs  HbA1c 4.8 - 5.6 % 6.9    6.9   7.8   8.3    Micro/Creat Ratio 0 - 29 mg/g creat   82      Chol 100 - 199 mg/dL    158     HDL >39 mg/dL    64     Calc LDL 0 - 99 mg/dL    79     Triglycerides 0 - 149 mg/dL    82     Creatinine 0.57 - 1.00 mg/dL 1.24   1.07   1.48   0.91   0.95        04/16/2022   11:30 AM 04/16/2022   10:45 AM 02/11/2022    9:26 AM 01/15/2022    8:56 AM 10/29/2021   12:02 PM 10/29/2021   11:15 AM 09/24/2021   10:21 AM  BP/Weight  Systolic BP 144 145 150 130 110 123 141  Diastolic BP 86 83 79 80 82 81 81  Wt. (Lbs)  227.08 225.08 226.12  232.04 235.12  BMI  40.23 kg/m2 39.87 kg/m2 40.06 kg/m2  41.1 kg/m2  41.65 kg/m2      01/15/2022    8:40 AM 11/15/2020    9:40 AM  Foot/eye exam completion dates  Foot Form Completion Done Done        Morbid obesity  Patient re-educated about  the importance of commitment to a  minimum of 150 minutes of exercise per week as able.  The importance of healthy food choices with portion control discussed, as well as eating regularly and within a 12 hour window most days. The need to choose "clean , green" food 50 to 75% of the time is discussed, as well as to make water the primary drink and set a goal of 64 ounces water daily.       04/16/2022   10:45 AM 02/11/2022    9:26 AM 01/15/2022    8:56 AM  Weight /BMI  Weight 227 lb 1.3 oz 225 lb 1.3 oz 226 lb 1.9 oz  Height 5' 3" (1.6 m) 5' 3" (1.6 m) 5' 3" (1.6 m)  BMI 40.23 kg/m2 39.87 kg/m2 40.06 kg/m2      Hyperlipidemia Hyperlipidemia:Low fat diet discussed and encouraged. Updated lab needed at/ before next visit.    Lipid Panel  Lab Results  Component Value Date   CHOL 158 09/20/2021   HDL 64 09/20/2021   LDLCALC 79 09/20/2021   TRIG 82 09/20/2021   CHOLHDL 2.7 03/04/2021       Allergic rhinitis Controlled, no change in medication   AKI (acute kidney injury) (HCC) Awaiting updated chem 7 and EGFr , chosing to hold out on Nephrology appt   Chronic GERD Controlled, no change in medication   

## 2022-04-16 NOTE — Patient Instructions (Addendum)
F/U in 7 weeks, call if you need me sooner  NEW higher dose of amlodipine 5 mg daily  New for diabetes is once weekly mounjaro 2.5 mg, changed due to cost post visit  Ensure 64 ounces water daily, and aim to stop eating after 7 pm   Eat mainly vegetables ,and fruit, fresh or frozen beans , white meat  Fasting lipid, cmp and EGFR, HBA1C, CBC, TSH and vit D 5 days before next appt. DRINK the 64 ounces water even the day of your blood draw please  It is important that you exercise regularly at least 30 minutes 5 times a week. If you develop chest pain, have severe difficulty breathing, or feel very tired, stop exercising immediately and seek medical attention   Thanks for choosing [facility], we consider it a privelige to serve you.

## 2022-04-16 NOTE — Telephone Encounter (Signed)
Patient return call in regard to last tele. ?

## 2022-04-16 NOTE — Telephone Encounter (Signed)
Pt called stating that one of the medications that was called in today is entirely too munch money. Wants to know if you can please call her? ?

## 2022-04-16 NOTE — Telephone Encounter (Signed)
VM FULL

## 2022-04-17 ENCOUNTER — Other Ambulatory Visit (HOSPITAL_BASED_OUTPATIENT_CLINIC_OR_DEPARTMENT_OTHER): Payer: Self-pay

## 2022-04-17 ENCOUNTER — Other Ambulatory Visit: Payer: Self-pay | Admitting: Family Medicine

## 2022-04-17 DIAGNOSIS — I10 Essential (primary) hypertension: Secondary | ICD-10-CM

## 2022-04-17 MED ORDER — TRIAMTERENE-HCTZ 75-50 MG PO TABS
1.0000 | ORAL_TABLET | Freq: Every day | ORAL | 11 refills | Status: DC
  Filled 2022-04-17: qty 30, 30d supply, fill #0
  Filled 2022-05-18: qty 30, 30d supply, fill #1
  Filled 2022-06-19: qty 30, 30d supply, fill #2
  Filled 2022-07-16: qty 30, 30d supply, fill #3
  Filled 2022-08-20: qty 30, 30d supply, fill #4
  Filled 2022-09-23: qty 30, 30d supply, fill #5
  Filled 2022-10-15 – 2022-10-17 (×2): qty 30, 30d supply, fill #6
  Filled 2022-11-18: qty 30, 30d supply, fill #7
  Filled 2022-12-21: qty 30, 30d supply, fill #8
  Filled 2023-01-15: qty 30, 30d supply, fill #9
  Filled 2023-02-22: qty 30, 30d supply, fill #10

## 2022-04-17 NOTE — Telephone Encounter (Signed)
VM FULL

## 2022-04-17 NOTE — Telephone Encounter (Signed)
Patient aware.

## 2022-04-20 ENCOUNTER — Encounter: Payer: Self-pay | Admitting: Family Medicine

## 2022-04-20 NOTE — Progress Notes (Signed)
  Tina Wilkins     MRN: [id number]      DOB: 05/06/1956   HPI Tina Wilkins is here for follow up and re-evaluation of chronic medical conditions, medication management and review of any available recent lab and radiology data.  Preventive health is updated, specifically  Cancer screening and Immunization.   Questions or concerns regarding consultations or procedures which the PT has had in the interim are  addressed. The PT denies any adverse reactions to current medications since the last visit.  There are no new concerns.  There are no specific complaints   ROS Denies recent fever or chills. Denies sinus pressure, nasal congestion, ear pain or sore throat. Denies chest congestion, productive cough or wheezing. Denies chest pains, palpitations and leg swelling Denies abdominal pain, nausea, vomiting,diarrhea or constipation.   Denies dysuria, frequency, hesitancy or incontinence. Denies joint pain, swelling and limitation in mobility. Denies headaches, seizures, numbness, or tingling. Denies depression, anxiety or insomnia. Denies skin break down or rash.   PE  BP (!) 144/86   Pulse 68   Ht 5' 3" (1.6 m)   Wt 227 lb 1.3 oz (103 kg)   SpO2 96%   BMI 40.23 kg/m   Patient alert and oriented and in no cardiopulmonary distress.  HEENT: No facial asymmetry, EOMI,     Neck supple .  Chest: Clear to auscultation bilaterally.  CVS: S1, S2 no murmurs, no S3.Regular rate.  ABD: Soft non tender.   Ext: No edema  MS: Adequate ROM spine, shoulders, hips and knees.  Skin: Intact, no ulcerations or rash noted.  Psych: Good eye contact, normal affect. Memory intact not anxious or depressed appearing.  CNS: CN 2-12 intact, power,  normal throughout.no focal deficits noted.   Assessment & Plan  Hypertension goal BP (blood pressure) < 130/80 Ucontrolled, dose inc in amlodipine DASH diet and commitment to daily physical activity for a minimum of 30 minutes discussed and  encouraged, as a part of hypertension management. The importance of attaining a healthy weight is also discussed.     04/16/2022   11:30 AM 04/16/2022   10:45 AM 02/11/2022    9:26 AM 01/15/2022    8:56 AM 10/29/2021   12:02 PM 10/29/2021   11:15 AM 09/24/2021   10:21 AM  BP/Weight  Systolic BP 144 145 150 130 110 123 141  Diastolic BP 86 83 79 80 82 81 81  Wt. (Lbs)  227.08 225.08 226.12  232.04 235.12  BMI  40.23 kg/m2 39.87 kg/m2 40.06 kg/m2  41.1 kg/m2 41.65 kg/m2       Type 2 diabetes mellitus with vascular disease (HCC) Updated lab needed at/ before next visit.  Tina Wilkins is reminded of the importance of commitment to daily physical activity for 30 minutes or more, as able and the need to limit carbohydrate intake to 30 to 60 grams per meal to help with blood sugar control.   The need to take medication as prescribed, test blood sugar as directed, and to call between visits if there is a concern that blood sugar is uncontrolled is also discussed.   Tina Wilkins is reminded of the importance of daily foot exam, annual eye examination, and good blood sugar, blood pressure and cholesterol control.     Latest Ref Rng & Units 04/11/2022    4:17 PM 02/07/2022    2:42 PM 01/09/2022   11:51 AM 09/20/2021    9:46 AM 06/13/2021    8:38 AM  Diabetic   Labs  HbA1c 4.8 - 5.6 % 6.9    6.9   7.8   8.3    Micro/Creat Ratio 0 - 29 mg/g creat   82      Chol 100 - 199 mg/dL    158     HDL >39 mg/dL    64     Calc LDL 0 - 99 mg/dL    79     Triglycerides 0 - 149 mg/dL    82     Creatinine 0.57 - 1.00 mg/dL 1.24   1.07   1.48   0.91   0.95        04/16/2022   11:30 AM 04/16/2022   10:45 AM 02/11/2022    9:26 AM 01/15/2022    8:56 AM 10/29/2021   12:02 PM 10/29/2021   11:15 AM 09/24/2021   10:21 AM  BP/Weight  Systolic BP 144 145 150 130 110 123 141  Diastolic BP 86 83 79 80 82 81 81  Wt. (Lbs)  227.08 225.08 226.12  232.04 235.12  BMI  40.23 kg/m2 39.87 kg/m2 40.06 kg/m2  41.1 kg/m2  41.65 kg/m2      01/15/2022    8:40 AM 11/15/2020    9:40 AM  Foot/eye exam completion dates  Foot Form Completion Done Done        Morbid obesity  Patient re-educated about  the importance of commitment to a  minimum of 150 minutes of exercise per week as able.  The importance of healthy food choices with portion control discussed, as well as eating regularly and within a 12 hour window most days. The need to choose "clean , green" food 50 to 75% of the time is discussed, as well as to make water the primary drink and set a goal of 64 ounces water daily.       04/16/2022   10:45 AM 02/11/2022    9:26 AM 01/15/2022    8:56 AM  Weight /BMI  Weight 227 lb 1.3 oz 225 lb 1.3 oz 226 lb 1.9 oz  Height 5' 3" (1.6 m) 5' 3" (1.6 m) 5' 3" (1.6 m)  BMI 40.23 kg/m2 39.87 kg/m2 40.06 kg/m2      Hyperlipidemia Hyperlipidemia:Low fat diet discussed and encouraged. Updated lab needed at/ before next visit.    Lipid Panel  Lab Results  Component Value Date   CHOL 158 09/20/2021   HDL 64 09/20/2021   LDLCALC 79 09/20/2021   TRIG 82 09/20/2021   CHOLHDL 2.7 03/04/2021       Allergic rhinitis Controlled, no change in medication   AKI (acute kidney injury) (HCC) Awaiting updated chem 7 and EGFr , chosing to hold out on Nephrology appt   Chronic GERD Controlled, no change in medication   

## 2022-04-20 NOTE — Assessment & Plan Note (Signed)
Controlled, no change in medication  

## 2022-04-20 NOTE — Assessment & Plan Note (Signed)
Updated lab needed at/ before next visit.  Tina Wilkins is reminded of the importance of commitment to daily physical activity for 30 minutes or more, as able and the need to limit carbohydrate intake to 30 to 60 grams per meal to help with blood sugar control.   The need to take medication as prescribed, test blood sugar as directed, and to call between visits if there is a concern that blood sugar is uncontrolled is also discussed.   Tina Wilkins is reminded of the importance of daily foot exam, annual eye examination, and good blood sugar, blood pressure and cholesterol control.     Latest Ref Rng & Units 04/11/2022    4:17 PM 02/07/2022    2:42 PM 01/09/2022   11:51 AM 09/20/2021    9:46 AM 06/13/2021    8:38 AM  Diabetic Labs  HbA1c 4.8 - 5.6 % 6.9    6.9   7.8   8.3    Micro/Creat Ratio 0 - 29 mg/g creat   82      Chol 100 - 199 mg/dL    034     HDL >91 mg/dL    64     Calc LDL 0 - 99 mg/dL    79     Triglycerides 0 - 149 mg/dL    82     Creatinine 7.91 - 1.00 mg/dL 5.05   6.97   9.48   0.16   0.95        04/16/2022   11:30 AM 04/16/2022   10:45 AM 02/11/2022    9:26 AM 01/15/2022    8:56 AM 10/29/2021   12:02 PM 10/29/2021   11:15 AM 09/24/2021   10:21 AM  BP/Weight  Systolic BP 144 145 150 130 110 123 141  Diastolic BP 86 83 79 80 82 81 81  Wt. (Lbs)  227.08 225.08 226.12  232.04 235.12  BMI  40.23 kg/m2 39.87 kg/m2 40.06 kg/m2  41.1 kg/m2 41.65 kg/m2      01/15/2022    8:40 AM 11/15/2020    9:40 AM  Foot/eye exam completion dates  Foot Form Completion Done Done

## 2022-04-20 NOTE — Assessment & Plan Note (Signed)
Awaiting updated chem 7 and EGFr , chosing to hold out on Nephrology appt

## 2022-04-20 NOTE — Assessment & Plan Note (Signed)
  Patient re-educated about  the importance of commitment to a  minimum of 150 minutes of exercise per week as able.  The importance of healthy food choices with portion control discussed, as well as eating regularly and within a 12 hour window most days. The need to choose "clean , green" food 50 to 75% of the time is discussed, as well as to make water the primary drink and set a goal of 64 ounces water daily.       04/16/2022   10:45 AM 02/11/2022    9:26 AM 01/15/2022    8:56 AM  Weight /BMI  Weight 227 lb 1.3 oz 225 lb 1.3 oz 226 lb 1.9 oz  Height 5\' 3"  (1.6 m) 5\' 3"  (1.6 m) 5\' 3"  (1.6 m)  BMI 40.23 kg/m2 39.87 kg/m2 40.06 kg/m2

## 2022-04-20 NOTE — Assessment & Plan Note (Signed)
Ucontrolled, dose inc in amlodipine DASH diet and commitment to daily physical activity for a minimum of 30 minutes discussed and encouraged, as a part of hypertension management. The importance of attaining a healthy weight is also discussed.     04/16/2022   11:30 AM 04/16/2022   10:45 AM 02/11/2022    9:26 AM 01/15/2022    8:56 AM 10/29/2021   12:02 PM 10/29/2021   11:15 AM 09/24/2021   10:21 AM  BP/Weight  Systolic BP 123456 Q000111Q Q000111Q [id number] Q000111Q  Diastolic BP 86 83 79 80 82 81 81  Wt. (Lbs)  227.08 225.08 226.12  232.04 235.12  BMI  40.23 kg/m2 39.87 kg/m2 40.06 kg/m2  41.1 kg/m2 41.65 kg/m2

## 2022-04-20 NOTE — Assessment & Plan Note (Addendum)
Hyperlipidemia:Low fat diet discussed and encouraged. Updated lab needed at/ before next visit.    Lipid Panel  Lab Results  Component Value Date   CHOL 158 09/20/2021   HDL 64 09/20/2021   LDLCALC 79 09/20/2021   TRIG 82 09/20/2021   CHOLHDL 2.7 03/04/2021

## 2022-05-19 ENCOUNTER — Other Ambulatory Visit (HOSPITAL_BASED_OUTPATIENT_CLINIC_OR_DEPARTMENT_OTHER): Payer: Self-pay

## 2022-06-04 ENCOUNTER — Encounter: Payer: Self-pay | Admitting: Family Medicine

## 2022-06-04 ENCOUNTER — Ambulatory Visit (INDEPENDENT_AMBULATORY_CARE_PROVIDER_SITE_OTHER): Payer: Medicare Other | Admitting: Family Medicine

## 2022-06-04 VITALS — BP 124/77 | HR 63 | Ht 62.75 in | Wt 221.1 lb

## 2022-06-04 DIAGNOSIS — Z1231 Encounter for screening mammogram for malignant neoplasm of breast: Secondary | ICD-10-CM

## 2022-06-04 DIAGNOSIS — E1159 Type 2 diabetes mellitus with other circulatory complications: Secondary | ICD-10-CM

## 2022-06-04 DIAGNOSIS — E782 Mixed hyperlipidemia: Secondary | ICD-10-CM

## 2022-06-04 DIAGNOSIS — I10 Essential (primary) hypertension: Secondary | ICD-10-CM

## 2022-06-04 NOTE — Patient Instructions (Signed)
F/U early October, flu vaccine at visit, call if you need me sooner  Follow low carb, reduced sweet, diet , low in fried and fatty  It is important that you exercise regularly at least 30 minutes 5 times a week. If you develop chest pain, have severe difficulty breathing, or feel very tired, stop exercising immediately and seek medical attention    Please get fasting labs 1 week before  Visit  Blood pressure is good, stay on current meds    Please schedule Jan mammogram [facility], morning appt preferred a t checkout'  Thanks for choosing [facility], we consider it a privelige to serve you.

## 2022-06-05 ENCOUNTER — Encounter: Payer: Self-pay | Admitting: Family Medicine

## 2022-06-05 NOTE — Progress Notes (Signed)
Tina Wilkins     MRN: [id number]      DOB: [birth year]   HPI Tina Wilkins is here for follow up and re-evaluation of chronic medical conditions, medication management and review of any available recent lab and radiology data.  Preventive health is updated, specifically  Cancer screening and Immunization.   Questions or concerns regarding consultations or procedures which the PT has had in the interim are  addressed. The PT denies any adverse reactions to current medications since the last visit.  There are no new concerns.  There are no specific complaints   ROS Denies recent fever or chills. Denies sinus pressure, nasal congestion, ear pain or sore throat. Denies chest congestion, productive cough or wheezing. Denies chest pains, palpitations and leg swelling Denies abdominal pain, nausea, vomiting,diarrhea or constipation.   Denies dysuria, frequency, hesitancy or incontinence. Denies joint pain, swelling and limitation in mobility. Denies headaches, seizures, numbness, or tingling. Denies depression, anxiety or insomnia. Denies skin break down or rash.   PE  BP 124/77   Pulse 63   Ht 5' 2.75" (1.594 m)   Wt 221 lb 1.3 oz (100.3 kg)   SpO2 97%   BMI 39.48 kg/m   Patient alert and oriented and in no cardiopulmonary distress.  HEENT: No facial asymmetry, EOMI,     Neck supple .  Chest: Clear to auscultation bilaterally.  CVS: S1, S2 no murmurs, no S3.Regular rate.  ABD: Soft non tender.   Ext: No edema  MS: Adequate ROM spine, shoulders, hips and knees.  Skin: Intact, no ulcerations or rash noted.  Psych: Good eye contact, normal affect. Memory intact not anxious or depressed appearing.  CNS: CN 2-12 intact, power,  normal throughout.no focal deficits noted.   Assessment & Plan  Hypertension goal BP (blood pressure) < 130/80 Controlled, no change in medication DASH diet and commitment to daily physical activity for a minimum of 30 minutes discussed and  encouraged, as a part of hypertension management. The importance of attaining a healthy weight is also discussed.     06/04/2022   10:14 AM 06/04/2022    9:55 AM 04/16/2022   11:30 AM 04/16/2022   10:45 AM 02/11/2022    9:26 AM 01/15/2022    8:56 AM 10/29/2021   12:02 PM  BP/Weight  Systolic BP 124 132 144 145 150 130 110  Diastolic BP 77 81 86 83 79 80 82  Wt. (Lbs)  221.08  227.08 225.08 226.12   BMI  39.48 kg/m2  40.23 kg/m2 39.87 kg/m2 40.06 kg/m2        Type 2 diabetes mellitus with vascular disease (HCC) Controlled, no change in medication Tina Wilkins is reminded of the importance of commitment to daily physical activity for 30 minutes or more, as able and the need to limit carbohydrate intake to 30 to 60 grams per meal to help with blood sugar control.   The need to take medication as prescribed, test blood sugar as directed, and to call between visits if there is a concern that blood sugar is uncontrolled is also discussed.   Tina Wilkins is reminded of the importance of daily foot exam, annual eye examination, and good blood sugar, blood pressure and cholesterol control.     Latest Ref Rng & Units 04/11/2022    4:17 PM 02/07/2022    2:42 PM 01/09/2022   11:51 AM 09/20/2021    9:46 AM 06/13/2021    8:38 AM  Diabetic Labs  HbA1c 4.8 -  5.6 % 6.9   6.9  7.8  8.3   Micro/Creat Ratio 0 - 29 mg/g creat   82     Chol 100 - 199 mg/dL    161    HDL >09 mg/dL    64    Calc LDL 0 - 99 mg/dL    79    Triglycerides 0 - 149 mg/dL    82    Creatinine 6.04 - 1.00 mg/dL 5.40  9.81  1.91  4.78  0.95       06/04/2022   10:14 AM 06/04/2022    9:55 AM 04/16/2022   11:30 AM 04/16/2022   10:45 AM 02/11/2022    9:26 AM 01/15/2022    8:56 AM 10/29/2021   12:02 PM  BP/Weight  Systolic BP 124 132 144 145 150 130 110  Diastolic BP 77 81 86 83 79 80 82  Wt. (Lbs)  221.08  227.08 225.08 226.12   BMI  39.48 kg/m2  40.23 kg/m2 39.87 kg/m2 40.06 kg/m2       01/15/2022    8:40 AM 11/15/2020    9:40 AM   Foot/eye exam completion dates  Foot Form Completion Done Done      Updated lab needed at/ before next visit.   Morbid obesity  Patient re-educated about  the importance of commitment to a  minimum of 150 minutes of exercise per week as able.  The importance of healthy food choices with portion control discussed, as well as eating regularly and within a 12 hour window most days. The need to choose "clean , green" food 50 to 75% of the time is discussed, as well as to make water the primary drink and set a goal of 64 ounces water daily.       06/04/2022    9:55 AM 04/16/2022   10:45 AM 02/11/2022    9:26 AM  Weight /BMI  Weight 221 lb 1.3 oz 227 lb 1.3 oz 225 lb 1.3 oz  Height 5' 2.75" (1.594 m) 5\' 3"  (1.6 m) 5\' 3"  (1.6 m)  BMI 39.48 kg/m2 40.23 kg/m2 39.87 kg/m2   Improved, she is congratulated on this   Hyperlipidemia Hyperlipidemia:Low fat diet discussed and encouraged.   Lipid Panel  Lab Results  Component Value Date   CHOL 158 09/20/2021   HDL 64 09/20/2021   LDLCALC 79 09/20/2021   TRIG 82 09/20/2021   CHOLHDL 2.7 03/04/2021     Updated lab needed at/ before next visit.

## 2022-06-08 ENCOUNTER — Encounter: Payer: Self-pay | Admitting: Family Medicine

## 2022-06-08 NOTE — Assessment & Plan Note (Signed)
  Patient re-educated about  the importance of commitment to a  minimum of 150 minutes of exercise per week as able.  The importance of healthy food choices with portion control discussed, as well as eating regularly and within a 12 hour window most days. The need to choose "clean , green" food 50 to 75% of the time is discussed, as well as to make water the primary drink and set a goal of 64 ounces water daily.       06/04/2022    9:55 AM 04/16/2022   10:45 AM 02/11/2022    9:26 AM  Weight /BMI  Weight 221 lb 1.3 oz 227 lb 1.3 oz 225 lb 1.3 oz  Height 5' 2.75" (1.594 m) 5\' 3"  (1.6 m) 5\' 3"  (1.6 m)  BMI 39.48 kg/m2 40.23 kg/m2 39.87 kg/m2   Improved, she is congratulated on this

## 2022-06-08 NOTE — Assessment & Plan Note (Signed)
Controlled, no change in medication Tina Wilkins is reminded of the importance of commitment to daily physical activity for 30 minutes or more, as able and the need to limit carbohydrate intake to 30 to 60 grams per meal to help with blood sugar control.   The need to take medication as prescribed, test blood sugar as directed, and to call between visits if there is a concern that blood sugar is uncontrolled is also discussed.   Tina Wilkins is reminded of the importance of daily foot exam, annual eye examination, and good blood sugar, blood pressure and cholesterol control.     Latest Ref Rng & Units 04/11/2022    4:17 PM 02/07/2022    2:42 PM 01/09/2022   11:51 AM 09/20/2021    9:46 AM 06/13/2021    8:38 AM  Diabetic Labs  HbA1c 4.8 - 5.6 % 6.9   6.9  7.8  8.3   Micro/Creat Ratio 0 - 29 mg/g creat   82     Chol 100 - 199 mg/dL    454    HDL >09 mg/dL    64    Calc LDL 0 - 99 mg/dL    79    Triglycerides 0 - 149 mg/dL    82    Creatinine 8.11 - 1.00 mg/dL 9.14  7.82  9.56  2.13  0.95       06/04/2022   10:14 AM 06/04/2022    9:55 AM 04/16/2022   11:30 AM 04/16/2022   10:45 AM 02/11/2022    9:26 AM 01/15/2022    8:56 AM 10/29/2021   12:02 PM  BP/Weight  Systolic BP 124 132 144 145 150 130 110  Diastolic BP 77 81 86 83 79 80 82  Wt. (Lbs)  221.08  227.08 225.08 226.12   BMI  39.48 kg/m2  40.23 kg/m2 39.87 kg/m2 40.06 kg/m2       01/15/2022    8:40 AM 11/15/2020    9:40 AM  Foot/eye exam completion dates  Foot Form Completion Done Done      Updated lab needed at/ before next visit.

## 2022-06-08 NOTE — Assessment & Plan Note (Signed)
Controlled, no change in medication DASH diet and commitment to daily physical activity for a minimum of 30 minutes discussed and encouraged, as a part of hypertension management. The importance of attaining a healthy weight is also discussed.     06/04/2022   10:14 AM 06/04/2022    9:55 AM 04/16/2022   11:30 AM 04/16/2022   10:45 AM 02/11/2022    9:26 AM 01/15/2022    8:56 AM 10/29/2021   12:02 PM  BP/Weight  Systolic BP 124 132 144 145 150 130 110  Diastolic BP 77 81 86 83 79 80 82  Wt. (Lbs)  221.08  227.08 225.08 226.12   BMI  39.48 kg/m2  40.23 kg/m2 39.87 kg/m2 40.06 kg/m2

## 2022-06-08 NOTE — Assessment & Plan Note (Signed)
Hyperlipidemia:Low fat diet discussed and encouraged.   Lipid Panel  Lab Results  Component Value Date   CHOL 158 09/20/2021   HDL 64 09/20/2021   LDLCALC 79 09/20/2021   TRIG 82 09/20/2021   CHOLHDL 2.7 03/04/2021     Updated lab needed at/ before next visit.

## 2022-06-19 ENCOUNTER — Other Ambulatory Visit: Payer: Self-pay | Admitting: Family Medicine

## 2022-06-20 ENCOUNTER — Other Ambulatory Visit (HOSPITAL_BASED_OUTPATIENT_CLINIC_OR_DEPARTMENT_OTHER): Payer: Self-pay

## 2022-06-20 MED ORDER — VITAMIN D (ERGOCALCIFEROL) 1.25 MG (50000 UNIT) PO CAPS
ORAL_CAPSULE | ORAL | 5 refills | Status: DC
  Filled 2022-06-20: qty 4, 28d supply, fill #0
  Filled 2022-10-15 – 2022-10-17 (×2): qty 4, 28d supply, fill #1
  Filled 2022-11-18: qty 4, 28d supply, fill #2
  Filled 2023-02-24 – 2023-03-30 (×2): qty 4, 28d supply, fill #0

## 2022-07-16 ENCOUNTER — Other Ambulatory Visit: Payer: Self-pay | Admitting: Family Medicine

## 2022-07-17 ENCOUNTER — Other Ambulatory Visit (HOSPITAL_BASED_OUTPATIENT_CLINIC_OR_DEPARTMENT_OTHER): Payer: Self-pay

## 2022-07-17 MED ORDER — CLONIDINE HCL 0.3 MG PO TABS
0.3000 mg | ORAL_TABLET | Freq: Every day | ORAL | 6 refills | Status: DC
  Filled 2022-07-17: qty 30, 30d supply, fill #0
  Filled 2022-08-20: qty 30, 30d supply, fill #1
  Filled 2022-09-23: qty 30, 30d supply, fill #2
  Filled 2022-10-15 – 2022-10-17 (×2): qty 30, 30d supply, fill #3
  Filled 2022-11-18 (×2): qty 15, 15d supply, fill #4
  Filled 2022-11-18: qty 30, 30d supply, fill #4
  Filled 2022-12-21: qty 30, 30d supply, fill #5
  Filled 2023-01-15: qty 30, 30d supply, fill #6

## 2022-07-18 ENCOUNTER — Other Ambulatory Visit (HOSPITAL_BASED_OUTPATIENT_CLINIC_OR_DEPARTMENT_OTHER): Payer: Self-pay

## 2022-07-29 ENCOUNTER — Other Ambulatory Visit (HOSPITAL_BASED_OUTPATIENT_CLINIC_OR_DEPARTMENT_OTHER): Payer: Self-pay

## 2022-08-21 ENCOUNTER — Other Ambulatory Visit (HOSPITAL_BASED_OUTPATIENT_CLINIC_OR_DEPARTMENT_OTHER): Payer: Self-pay

## 2022-08-30 LAB — CMP14+EGFR
ALT: 27 IU/L (ref 0–32)
AST: 24 IU/L (ref 0–40)
Albumin/Globulin Ratio: 1.6 (ref 1.2–2.2)
Albumin: 4.4 g/dL (ref 3.9–4.9)
Alkaline Phosphatase: 98 IU/L (ref 44–121)
BUN/Creatinine Ratio: 17 (ref 12–28)
BUN: 17 mg/dL (ref 8–27)
Bilirubin Total: 0.6 mg/dL (ref 0.0–1.2)
CO2: 27 mmol/L (ref 20–29)
Calcium: 10 mg/dL (ref 8.7–10.3)
Chloride: 103 mmol/L (ref 96–106)
Creatinine, Ser: 1.03 mg/dL — ABNORMAL HIGH (ref 0.57–1.00)
Globulin, Total: 2.8 g/dL (ref 1.5–4.5)
Glucose: 105 mg/dL — ABNORMAL HIGH (ref 70–99)
Potassium: 3.9 mmol/L (ref 3.5–5.2)
Sodium: 144 mmol/L (ref 134–144)
Total Protein: 7.2 g/dL (ref 6.0–8.5)
eGFR: 59 mL/min/{1.73_m2} — ABNORMAL LOW (ref >59)

## 2022-08-30 LAB — CBC
Hematocrit: 36.1 % (ref 34.0–46.6)
Hemoglobin: 11.5 g/dL (ref 11.1–15.9)
MCH: 26.5 pg — ABNORMAL LOW (ref 26.6–33.0)
MCHC: 31.9 g/dL (ref 31.5–35.7)
MCV: 83 fL (ref 79–97)
Platelets: 380 10*3/uL (ref 150–450)
RBC: 4.34 x10E6/uL (ref 3.77–5.28)
RDW: 14.6 % (ref 11.7–15.4)
WBC: 6.3 10*3/uL (ref 3.4–10.8)

## 2022-08-30 LAB — HEMOGLOBIN A1C
Est. average glucose Bld gHb Est-mCnc: 169 mg/dL
Hgb A1c MFr Bld: 7.5 % — ABNORMAL HIGH (ref 4.8–5.6)

## 2022-08-30 LAB — LIPID PANEL
Chol/HDL Ratio: 2.1 ratio (ref 0.0–4.4)
Cholesterol, Total: 163 mg/dL (ref 100–199)
HDL: 77 mg/dL (ref >39)
LDL Chol Calc (NIH): 73 mg/dL (ref 0–99)
Triglycerides: 69 mg/dL (ref 0–149)
VLDL Cholesterol Cal: 13 mg/dL (ref 5–40)

## 2022-08-30 LAB — TSH: TSH: 1.71 u[IU]/mL (ref 0.450–4.500)

## 2022-08-30 LAB — VITAMIN D 25 HYDROXY (VIT D DEFICIENCY, FRACTURES): Vit D, 25-Hydroxy: 42.3 ng/mL (ref 30.0–100.0)

## 2022-09-04 ENCOUNTER — Other Ambulatory Visit (HOSPITAL_BASED_OUTPATIENT_CLINIC_OR_DEPARTMENT_OTHER): Payer: Self-pay

## 2022-09-04 ENCOUNTER — Ambulatory Visit (INDEPENDENT_AMBULATORY_CARE_PROVIDER_SITE_OTHER): Payer: Medicare Other | Admitting: Family Medicine

## 2022-09-04 ENCOUNTER — Encounter: Payer: Self-pay | Admitting: Family Medicine

## 2022-09-04 VITALS — BP 129/82 | HR 66 | Ht 63.0 in | Wt 229.0 lb

## 2022-09-04 DIAGNOSIS — E1159 Type 2 diabetes mellitus with other circulatory complications: Secondary | ICD-10-CM | POA: Diagnosis not present

## 2022-09-04 DIAGNOSIS — I10 Essential (primary) hypertension: Secondary | ICD-10-CM

## 2022-09-04 DIAGNOSIS — Z23 Encounter for immunization: Secondary | ICD-10-CM

## 2022-09-04 DIAGNOSIS — E782 Mixed hyperlipidemia: Secondary | ICD-10-CM

## 2022-09-04 MED ORDER — SEMAGLUTIDE (1 MG/DOSE) 4 MG/3ML ~~LOC~~ SOPN
1.0000 mg | PEN_INJECTOR | SUBCUTANEOUS | 3 refills | Status: DC
  Filled 2022-09-04 – 2023-02-24 (×2): qty 3, 28d supply, fill #0

## 2022-09-04 NOTE — Patient Instructions (Signed)
F/U in mid January, call if you need me before  Flu vaccine today   Please reduce sweets and starchy fooods and sweet drinks  Labs have improved , only bad thing is that your blood sugar is not well controlled  Non fasting hBa1C, chem 7 and EGFR 3 to 5 days before next visit  It is important that you exercise regularly at least 30 minutes 5 times a week. If you develop chest pain, have severe difficulty breathing, or feel very tired, stop exercising immediately and seek medical attention    Thanks for choosing [facility], we consider it a privelige to serve you.

## 2022-09-04 NOTE — Progress Notes (Signed)
Tina Wilkins     MRN: [id number]      DOB: 05-18-1956   HPI Tina Wilkins is here for follow up and re-evaluation of chronic medical conditions, medication management and review of any available recent lab and radiology data.  Preventive health is updated, specifically  Cancer screening and Immunization.   Questions or concerns regarding consultations or procedures which the PT has had in the interim are  addressed. The PT denies any adverse reactions to current medications since the last visit.  There are no new concerns.  There are no specific complaints   ROS Denies recent fever or chills. Denies sinus pressure, nasal congestion, ear pain or sore throat. Denies chest congestion, productive cough or wheezing. Denies chest pains, palpitations and leg swelling Denies abdominal pain, nausea, vomiting,diarrhea or constipation.   Denies dysuria, frequency, hesitancy or incontinence. Denies joint pain, swelling and limitation in mobility. Denies headaches, seizures, numbness, or tingling. Denies depression, anxiety or insomnia. Denies skin break down or rash.   PE  BP 129/82 (BP Location: Right Arm, Patient Position: Sitting)   Pulse 66   Ht 5\' 3"  (1.6 m)   Wt 229 lb (103.9 kg)   SpO2 97%   BMI 40.57 kg/m   Patient alert and oriented and in no cardiopulmonary distress.  HEENT: No facial asymmetry, EOMI,     Neck supple .  Chest: Clear to auscultation bilaterally.  CVS: S1, S2 no murmurs, no S3.Regular rate.  ABD: Soft non tender.   Ext: No edema  MS: Adequate ROM spine, shoulders, hips and knees.  Skin: Intact, no ulcerations or rash noted.  Psych: Good eye contact, normal affect. Memory intact not anxious or depressed appearing.  CNS: CN 2-12 intact, power,  normal throughout.no focal deficits noted.   Assessment & Plan  Hypertension goal BP (blood pressure) < 130/80 Controlled, no change in medication DASH diet and commitment to daily physical activity for a  minimum of 30 minutes discussed and encouraged, as a part of hypertension management. The importance of attaining a healthy weight is also discussed.     09/04/2022    9:45 AM 06/04/2022   10:14 AM 06/04/2022    9:55 AM 04/16/2022   11:30 AM 04/16/2022   10:45 AM 02/11/2022    9:26 AM 01/15/2022    8:56 AM  BP/Weight  Systolic BP 122 482 500 370 488 891 694  Diastolic BP 82 77 81 86 83 79 80  Wt. (Lbs) 229  221.08  227.08 225.08 226.12  BMI 40.57 kg/m2  39.48 kg/m2  40.23 kg/m2 39.87 kg/m2 40.06 kg/m2    '   Morbid obesity Deteriorated  Patient re-educated about  the importance of commitment to a  minimum of 150 minutes of exercise per week as able.  The importance of healthy food choices with portion control discussed, as well as eating regularly and within a 12 hour window most days. The need to choose "clean , green" food 50 to 75% of the time is discussed, as well as to make water the primary drink and set a goal of 64 ounces water daily.       09/04/2022    9:45 AM 06/04/2022    9:55 AM 04/16/2022   10:45 AM  Weight /BMI  Weight 229 lb 221 lb 1.3 oz 227 lb 1.3 oz  Height 5\' 3"  (1.6 m) 5' 2.75" (1.594 m) 5\' 3"  (1.6 m)  BMI 40.57 kg/m2 39.48 kg/m2 40.23 kg/m2  Type 2 diabetes mellitus with vascular disease [facility]) Tina Wilkins is reminded of the importance of commitment to daily physical activity for 30 minutes or more, as able and the need to limit carbohydrate intake to 30 to 60 grams per meal to help with blood sugar control.   The need to take medication as prescribed, test blood sugar as directed, and to call between visits if there is a concern that blood sugar is uncontrolled is also discussed.   Tina Wilkins is reminded of the importance of daily foot exam, annual eye examination, and good blood sugar, blood pressure and cholesterol control.     Latest Ref Rng & Units 08/29/2022   10:14 AM 04/11/2022    4:17 PM 02/07/2022    2:42 PM 01/09/2022   11:51 AM 09/20/2021     9:46 AM  Diabetic Labs  HbA1c 4.8 - 5.6 % 7.5  6.9   6.9  7.8   Micro/Creat Ratio 0 - 29 mg/g creat    82    Chol 100 - 199 mg/dL 163     158   HDL >39 mg/dL 77     64   Calc LDL 0 - 99 mg/dL 73     79   Triglycerides 0 - 149 mg/dL 69     82   Creatinine 0.57 - 1.00 mg/dL 1.03  1.24  1.07  1.48  0.91       09/04/2022    9:45 AM 06/04/2022   10:14 AM 06/04/2022    9:55 AM 04/16/2022   11:30 AM 04/16/2022   10:45 AM 02/11/2022    9:26 AM 01/15/2022    8:56 AM  BP/Weight  Systolic BP Q000111Q [id number] Q000111Q 123456 Q000111Q Q000111Q [id number]  Diastolic BP 82 77 81 86 83 79 80  Wt. (Lbs) 229  221.08  227.08 225.08 226.12  BMI 40.57 kg/m2  39.48 kg/m2  40.23 kg/m2 39.87 kg/m2 40.06 kg/m2      01/15/2022    8:40 AM 11/15/2020    9:40 AM  Foot/eye exam completion dates  Foot Form Completion Done Done      Deteriorated, resume semaglutide at dose tolerated and modify diet  Hyperlipidemia Hyperlipidemia:Low fat diet discussed and encouraged.   Lipid Panel  Lab Results  Component Value Date   CHOL 163 08/29/2022   HDL 77 08/29/2022   LDLCALC 73 08/29/2022   TRIG 69 08/29/2022   CHOLHDL 2.1 08/29/2022   Controlled, no change in medication

## 2022-09-08 ENCOUNTER — Ambulatory Visit (INDEPENDENT_AMBULATORY_CARE_PROVIDER_SITE_OTHER): Payer: Medicare Other | Admitting: Internal Medicine

## 2022-09-08 ENCOUNTER — Encounter: Payer: Self-pay | Admitting: Family Medicine

## 2022-09-08 ENCOUNTER — Encounter: Payer: Self-pay | Admitting: Internal Medicine

## 2022-09-08 DIAGNOSIS — Z0000 Encounter for general adult medical examination without abnormal findings: Secondary | ICD-10-CM

## 2022-09-08 NOTE — Progress Notes (Signed)
Subjective:  This is a telephone encounter between [employer] and Tina Wilkins on 09/08/2022 for AWV. The visit was conducted with the patient located at home and Tina Wilkins at [facility]. The patient's identity was confirmed using their DOB and current address. The patient has consented to being evaluated through a telephone encounter and understands the associated risks (an examination cannot be done and the patient may need to come in for an appointment) / benefits (allows the patient to remain at home).    Tina Wilkins is a 66 y.o. female who presents for Medicare Annual (Subsequent) preventive examination.  Review of Systems    Review of Systems  All other systems reviewed and are negative.    Objective:    Today's Vitals   09/08/22 1409  PainSc: 0-No pain   There is no height or weight on file to calculate BMI.     09/08/2022    2:12 PM 09/04/2021    2:28 PM 08/31/2020    8:18 AM 08/31/2019    1:35 PM 04/01/2017   10:45 AM 07/25/2015   10:01 AM  Advanced Directives  Does Patient Have a Medical Advance Directive? Yes Yes Yes No Yes No;Yes  Type of Advance Directive  Living will [place];Living will  [place];Living will   Does patient want to make changes to medical advance directive? Yes (MAU/Ambulatory/Procedural Areas - Information given)  Yes (MAU/Ambulatory/Procedural Areas - Information given)  Yes (MAU/Ambulatory/Procedural Areas - Information given) No - Patient declined  Copy of [place] in Chart?   No - copy requested  No - copy requested No - copy requested  Would patient like information on creating a medical advance directive?  No - Patient declined    No - patient declined information    Current Medications (verified) Outpatient Encounter Medications as of 09/08/2022  Medication Sig   Accu-Chek FastClix Lancets MISC USE AS DIRECTED TO CHECK BLOOD SUGAR DAILY   ACCU-CHEK GUIDE test strip USE AS  DIRECTED TO CHECK BLOOD SUGAR DAILY   acetaminophen (TYLENOL) 500 MG tablet Take 500 mg by mouth every 6 (six) hours as needed.   Alcohol Swabs (ALCOHOL PREPS) PADS Use as directed when testing blood glucose   amLODipine (NORVASC) 5 MG tablet Take 1 tablet (5 mg total) by mouth daily.   aspirin EC 81 MG tablet Take 81 mg by mouth daily. (Patient not taking: Reported on 04/16/2022)   bimatoprost (LUMIGAN) 0.01 % SOLN Instill 1 drop into both eyes at bedtime   blood glucose meter kit and supplies Accuchek meter (per patient request) once daily testing  DX e11.9   calcium-vitamin D (OSCAL WITH D) 500-200 MG-UNIT per tablet Take 1 tablet by mouth 2 (two) times daily.   cloNIDine (CATAPRES) 0.3 MG tablet Take 1 tablet (0.3 mg total) by mouth at bedtime.   metoprolol succinate (TOPROL-XL) 25 MG 24 hr tablet TAKE 1 TABLET BY MOUTH ONCE DAILY   pantoprazole (PROTONIX) 40 MG tablet Take 1 tablet (40 mg total) by mouth daily.   RESTASIS 0.05 % ophthalmic emulsion    rosuvastatin (CRESTOR) 40 MG tablet TAKE 1 TABLET (40 MG TOTAL) BY MOUTH DAILY.   Semaglutide, 1 MG/DOSE, 4 MG/3ML SOPN Inject 1 mg as directed once a week.   triamterene-hydrochlorothiazide (MAXZIDE) 75-50 MG tablet TAKE 1 TABLET BY MOUTH DAILY.   Vitamin D, Ergocalciferol, (DRISDOL) 1.25 MG (50000 UNIT) CAPS capsule TAKE 1 CAPSULE (50,000 UNITS) BY MOUTH ONCE A  WEEK.   No facility-administered encounter medications on file as of 09/08/2022.    Allergies (verified) Peanut-containing drug products, Celecoxib, Metaxalone, Metformin and related, Nolamine [chlorphen-phenind-phenylprop], Ozempic (0.25 or 0.5 mg-dose) [semaglutide(0.25 or 0.52m-dos)], Sitagliptin-metformin hcl, and Skelaxin   History: Past Medical History:  Diagnosis Date   Essential hypertension 12/02/2003   Hyperlipidemia 12/02/2003   Hypertension    Obesity    PSVT (paroxysmal supraventricular tachycardia)    Seasonal allergies    Type 2 diabetes mellitus ([place]  12/01/2008   Past Surgical History:  Procedure Laterality Date   ABDOMINAL HYSTERECTOMY  2004   Secondary to fibroids, single ovary left   CESAREAN SECTION  1989   2 vaginal deliveries before   SHOULDER SURGERY Left    Family History  Problem Relation Age of Onset   Heart disease Mother    Stroke Mother    Heart disease Father    Stroke Father    Diabetes Father    Diabetes Sister    Heart disease Sister    Lymphoma Sister    Cancer Brother    Heart disease Brother    Cancer Brother        unknown type   Lung disease Brother    Heart disease Brother    Liver cancer Brother    Lung cancer Brother    Liver cancer Brother    Lung cancer Brother    Seizures Son    Social History   Socioeconomic History   Marital status: Married    Spouse name: LHall Wilkins  Number of children: 3   Years of education: Not on file   Highest education level: Not on file  Occupational History   Not on file  Tobacco Use   Smoking status: Never   Smokeless tobacco: Never  Vaping Use   Vaping Use: Never used  Substance and Sexual Activity   Alcohol use: No    Alcohol/week: 0.0 standard drinks of alcohol   Drug use: No   Sexual activity: Yes    Birth control/protection: Surgical  Other Topics Concern   Not on file  Social History Narrative   2 children.    3 grandchildren.   Social Determinants of Health   Financial Resource Strain: Low Risk  (08/31/2020)   Overall Financial Resource Strain (CARDIA)    Difficulty of Paying Living Expenses: Not hard at all  Food Insecurity: No Food Insecurity (09/04/2021)   Hunger Vital Sign    Worried About Running Out of Food in the Last Year: Never true    Ran Out of Food in the Last Year: Never true  Transportation Needs: No Transportation Needs (09/04/2021)   PRAPARE - THydrologist(Medical): No    Lack of Transportation (Non-Medical): No  Physical Activity: Insufficiently Active (09/04/2021)   Exercise Vital Sign     Days of Exercise per Week: 3 days    Minutes of Exercise per Session: 30 min  Stress: No Stress Concern Present (09/04/2021)   [place]   Feeling of Stress : Not at all  Social Connections: [place](09/04/2021)   Social Connection and Isolation Panel [NHANES]    Frequency of Communication with Friends and Family: More than three times a week    Frequency of Social Gatherings with Friends and Family: More than three times a week    Attends Religious Services: More than 4 times per year    Active Member of  Clubs or Organizations: Yes    Attends Music therapist: More than 4 times per year    Marital Status: Married    Tobacco Counseling Counseling given: Not Answered   Clinical Intake:  Pre-visit preparation completed: Yes  Pain : No/denies pain Pain Score: 0-No pain     Nutritional Status: BMI > 30  Obese Diabetes: Yes CBG done?: No Did pt. bring in CBG monitor from home?: No  How often do you need to have someone help you when you read instructions, pamphlets, or other written materials from your doctor or pharmacy?: 1 - Never What is the last grade level you completed in school?: Associate Degree in Business  Diabetic?Yes         Activities of Daily Living     No data to display          Patient Care Team: Fayrene Helper, MD as PCP - General Domenic Polite Aloha Gell, MD as PCP - Cardiology (Cardiology) Carol Ada, MD as Consulting Physician (Gastroenterology) Marylynn Pearson, MD as Consulting Physician (Ophthalmology) Carole Civil, MD as Consulting Physician (Orthopedic Surgery)  Indicate any recent Medical Services you may have received from other than [facility] providers in the past year (date may be approximate).     Assessment:   This is a routine wellness examination for Claressa.  Hearing/Vision screen No results found.  Dietary issues and exercise  activities discussed:     Goals Addressed   None    Depression Screen    09/04/2022    9:46 AM 06/04/2022    9:59 AM 04/16/2022   10:45 AM 02/11/2022    9:27 AM 01/15/2022    8:57 AM 10/29/2021   11:16 AM 09/24/2021   10:21 AM  PHQ 2/9 Scores  PHQ - 2 Score 0 0 0 0 0 0 0  PHQ- 9 Score       0    Fall Risk    09/04/2022    9:46 AM 06/04/2022    9:59 AM 04/16/2022   10:45 AM 02/11/2022    9:26 AM 01/15/2022    8:56 AM  Fall Risk   Falls in the past year? 0 0 0 0 0  Number falls in past yr: 0 0 0 0 0  Injury with Fall? 0 0 0 0 0  Risk for fall due to : No Fall Risks No Fall Risks No Fall Risks No Fall Risks No Fall Risks  Follow up Falls evaluation completed Falls evaluation completed Falls evaluation completed Falls evaluation completed Falls evaluation completed    FALL RISK PREVENTION PERTAINING TO THE HOME:  Any stairs in or around the home? No  If so, are there any without handrails? No  Home free of loose throw rugs in walkways, pet beds, electrical cords, etc? Yes  Adequate lighting in your home to reduce risk of falls? Yes   ASSISTIVE DEVICES UTILIZED TO PREVENT FALLS:  Life alert? No  Use of a cane, walker or w/c? No  Grab bars in the bathroom? Yes  Shower chair or bench in shower? Yes  Elevated toilet seat or a handicapped toilet? Yes    Cognitive Function:        08/31/2020    8:20 AM 08/31/2019    1:27 PM 04/01/2017   10:48 AM  6CIT Screen  What Year? 0 points 0 points 0 points  What month? 0 points 0 points 0 points  What time? 0 points 0 points 0 points  Count  back from 20 0 points 0 points 0 points  Months in reverse 0 points 0 points 0 points  Repeat phrase 0 points 0 points 0 points  Total Score 0 points 0 points 0 points    Immunizations Immunization History  Administered Date(s) Administered   Fluad Quad(high Dose 65+) 08/31/2019, 08/21/2020, 08/02/2021, 09/04/2022   Influenza Split 09/11/2014   Influenza Whole 10/06/2006, 08/22/2009    Influenza,inj,Quad PF,6+ Mos 07/28/2018   Moderna Covid-19 Vaccine Bivalent Booster 34yr & up 08/05/2022   Moderna SARS-COV2 Booster Vaccination 10/08/2021   Moderna Sars-Covid-2 Vaccination 01/08/2020, 02/08/2020, 10/09/2020, 05/13/2021   Pneumococcal Conjugate-13 02/14/2015   Pneumococcal Polysaccharide-23 09/22/2013, 09/12/2019   Tdap 01/26/2013   Zoster Recombinat (Shingrix) 04/06/2019, 06/28/2019   Zoster, Live 10/18/2014    TDAP status: Up to date  Flu Vaccine status: Up to date  Pneumococcal vaccine status: Up to date  Covid-19 vaccine status: Completed vaccines  Qualifies for Shingles Vaccine? Yes   Zostavax completed No   Shingrix Completed?: Yes  Screening Tests Health Maintenance  Topic Date Due   OPHTHALMOLOGY EXAM  11/07/2022   COVID-19 Vaccine (6 - Moderna series) 12/05/2022   Diabetic kidney evaluation - Urine ACR  01/09/2023   FOOT EXAM  01/15/2023   TETANUS/TDAP  01/26/2023   HEMOGLOBIN A1C  02/27/2023   Diabetic kidney evaluation - GFR measurement  08/30/2023   MAMMOGRAM  12/24/2023   COLONOSCOPY (Pts 45-433yrInsurance coverage will need to be confirmed)  08/01/2025   Pneumonia Vaccine 6532Years old  Completed   INFLUENZA VACCINE  Completed   DEXA SCAN  Completed   Hepatitis C Screening  Completed   Zoster Vaccines- Shingrix  Completed   HPV VACCINES  Aged Out    Health Maintenance  There are no preventive care reminders to display for this patient.  Colorectal cancer screening: Type of screening: Colonoscopy. Completed 08/02/2015. Repeat every 10 years  Mammogram status: Completed 12/23/2021. Repeat every 2 years.  Bone Density status: Completed 12/19/2019. Results reflect: Bone density results: OSTEOPENIA. Competed  Lung Cancer Screening: (Low Dose CT Chest recommended if Age 762-80ears, 30 pack-year currently smoking OR have quit w/in 15years.) does not qualify.    Additional Screening:  Hepatitis C Screening: does qualify; Completed  02/27/2016  Vision Screening: Recommended annual ophthalmology exams for early detection of glaucoma and other disorders of the eye. Is the patient up to date with their annual eye exam?  Yes  Who is the provider or what is the name of the office in which the patient attends annual eye exams? Dr.Whitaker If pt is not established with a provider, would they like to be referred to a provider to establish care? No .   Dental Screening: Recommended annual dental exams for proper oral hygiene  Community Resource Referral / Chronic Care Management: CRR required this visit?  No   CCM required this visit?  No      Plan:     I have personally reviewed and noted the following in the patient's chart:   Medical and social history Use of alcohol, tobacco or illicit drugs  Current medications and supplements including opioid prescriptions. Patient is not currently taking opioid prescriptions. Functional ability and status Nutritional status Physical activity Advanced directives List of other physicians Hospitalizations, surgeries, and ER visits in previous 12 months Vitals Screenings to include cognitive, depression, and falls Referrals and appointments  In addition, I have reviewed and discussed with patient certain preventive protocols, quality metrics, and best practice recommendations. A  written personalized care plan for preventive services as well as general preventive health recommendations were provided to patient.     Tina Dy, MD   09/08/2022

## 2022-09-08 NOTE — Assessment & Plan Note (Signed)
Ms. Boyar is reminded of the importance of commitment to daily physical activity for 30 minutes or more, as able and the need to limit carbohydrate intake to 30 to 60 grams per meal to help with blood sugar control.   The need to take medication as prescribed, test blood sugar as directed, and to call between visits if there is a concern that blood sugar is uncontrolled is also discussed.   Ms. Lesh is reminded of the importance of daily foot exam, annual eye examination, and good blood sugar, blood pressure and cholesterol control.     Latest Ref Rng & Units 08/29/2022   10:14 AM 04/11/2022    4:17 PM 02/07/2022    2:42 PM 01/09/2022   11:51 AM 09/20/2021    9:46 AM  Diabetic Labs  HbA1c 4.8 - 5.6 % 7.5  6.9   6.9  7.8   Micro/Creat Ratio 0 - 29 mg/g creat    82    Chol 100 - 199 mg/dL 163     158   HDL >39 mg/dL 77     64   Calc LDL 0 - 99 mg/dL 73     79   Triglycerides 0 - 149 mg/dL 69     82   Creatinine 0.57 - 1.00 mg/dL 1.03  1.24  1.07  1.48  0.91       09/04/2022    9:45 AM 06/04/2022   10:14 AM 06/04/2022    9:55 AM 04/16/2022   11:30 AM 04/16/2022   10:45 AM 02/11/2022    9:26 AM 01/15/2022    8:56 AM  BP/Weight  Systolic BP 518 841 660 630 160 109 323  Diastolic BP 82 77 81 86 83 79 80  Wt. (Lbs) 229  221.08  227.08 225.08 226.12  BMI 40.57 kg/m2  39.48 kg/m2  40.23 kg/m2 39.87 kg/m2 40.06 kg/m2      01/15/2022    8:40 AM 11/15/2020    9:40 AM  Foot/eye exam completion dates  Foot Form Completion Done Done      Deteriorated, resume semaglutide at dose tolerated and modify diet

## 2022-09-08 NOTE — Assessment & Plan Note (Signed)
Deteriorated  Patient re-educated about  the importance of commitment to a  minimum of 150 minutes of exercise per week as able.  The importance of healthy food choices with portion control discussed, as well as eating regularly and within a 12 hour window most days. The need to choose "clean , green" food 50 to 75% of the time is discussed, as well as to make water the primary drink and set a goal of 64 ounces water daily.       09/04/2022    9:45 AM 06/04/2022    9:55 AM 04/16/2022   10:45 AM  Weight /BMI  Weight 229 lb 221 lb 1.3 oz 227 lb 1.3 oz  Height 5\' 3"  (1.6 m) 5' 2.75" (1.594 m) 5\' 3"  (1.6 m)  BMI 40.57 kg/m2 39.48 kg/m2 40.23 kg/m2

## 2022-09-08 NOTE — Assessment & Plan Note (Signed)
Hyperlipidemia:Low fat diet discussed and encouraged.   Lipid Panel  Lab Results  Component Value Date   CHOL 163 08/29/2022   HDL 77 08/29/2022   LDLCALC 73 08/29/2022   TRIG 69 08/29/2022   CHOLHDL 2.1 08/29/2022   Controlled, no change in medication

## 2022-09-08 NOTE — Assessment & Plan Note (Signed)
Controlled, no change in medication DASH diet and commitment to daily physical activity for a minimum of 30 minutes discussed and encouraged, as a part of hypertension management. The importance of attaining a healthy weight is also discussed.     09/04/2022    9:45 AM 06/04/2022   10:14 AM 06/04/2022    9:55 AM 04/16/2022   11:30 AM 04/16/2022   10:45 AM 02/11/2022    9:26 AM 01/15/2022    8:56 AM  BP/Weight  Systolic BP 161 096 045 409 811 914 782  Diastolic BP 82 77 81 86 83 79 80  Wt. (Lbs) 229  221.08  227.08 225.08 226.12  BMI 40.57 kg/m2  39.48 kg/m2  40.23 kg/m2 39.87 kg/m2 40.06 kg/m2    '

## 2022-09-19 ENCOUNTER — Other Ambulatory Visit (HOSPITAL_BASED_OUTPATIENT_CLINIC_OR_DEPARTMENT_OTHER): Payer: Self-pay

## 2022-09-23 ENCOUNTER — Other Ambulatory Visit (HOSPITAL_BASED_OUTPATIENT_CLINIC_OR_DEPARTMENT_OTHER): Payer: Self-pay

## 2022-09-23 ENCOUNTER — Other Ambulatory Visit: Payer: Self-pay | Admitting: Family Medicine

## 2022-09-23 MED ORDER — PANTOPRAZOLE SODIUM 40 MG PO TBEC
40.0000 mg | DELAYED_RELEASE_TABLET | Freq: Every day | ORAL | 5 refills | Status: DC
  Filled 2022-09-23: qty 30, 30d supply, fill #0

## 2022-10-09 ENCOUNTER — Encounter: Payer: Self-pay | Admitting: Cardiology

## 2022-10-09 ENCOUNTER — Ambulatory Visit: Payer: Medicare Other | Attending: Cardiology | Admitting: Cardiology

## 2022-10-09 VITALS — BP 138/90 | HR 67 | Ht 64.0 in | Wt 235.0 lb

## 2022-10-09 DIAGNOSIS — E782 Mixed hyperlipidemia: Secondary | ICD-10-CM | POA: Diagnosis present

## 2022-10-09 DIAGNOSIS — I10 Essential (primary) hypertension: Secondary | ICD-10-CM | POA: Insufficient documentation

## 2022-10-09 DIAGNOSIS — I471 Supraventricular tachycardia, unspecified: Secondary | ICD-10-CM | POA: Diagnosis present

## 2022-10-09 NOTE — Patient Instructions (Signed)
Medication Instructions:  Your physician recommends that you continue on your current medications as directed. Please refer to the Current Medication list given to you today.   Labwork: None today  Testing/Procedures: None today  Follow-Up: 1 year  Any Other Special Instructions Will Be Listed Below (If Applicable).  If you need a refill on your cardiac medications before your next appointment, please call your pharmacy.  

## 2022-10-09 NOTE — Progress Notes (Signed)
Cardiology Office Note  Date: 10/09/2022   ID: JACOBI RYANT, DOB [birth year], MRN [id number]  PCP:  Fayrene Helper, MD  Cardiologist:  Rozann Lesches, MD Electrophysiologist:  None   Chief Complaint  Patient presents with   Cardiac follow-up    History of Present Illness: Tina Wilkins is a 66 y.o. female last seen in September 2022.  She is here for a routine visit.  Reports no palpitations or chest pain.  Still enjoying retirement.  She and her husband are both very active with her church.  I reviewed her medications, no significant change in cardiac regimen.  She is tolerating Toprol-XL at current dose.  I personally reviewed her ECG today which shows sinus rhythm with prolonged PR interval, incomplete right branch block and left anterior fascicular block.  I reviewed her interval lab work per Dr. Moshe Cipro.  Past Medical History:  Diagnosis Date   Essential hypertension 12/02/2003   Hyperlipidemia 12/02/2003   Hypertension    Obesity    PSVT (paroxysmal supraventricular tachycardia)    Seasonal allergies    Type 2 diabetes mellitus ([place]) 12/01/2008    Past Surgical History:  Procedure Laterality Date   ABDOMINAL HYSTERECTOMY  2004   Secondary to fibroids, single ovary left   CESAREAN SECTION  1989   2 vaginal deliveries before   SHOULDER SURGERY Left     Current Outpatient Medications  Medication Sig Dispense Refill   Accu-Chek FastClix Lancets MISC USE AS DIRECTED TO CHECK BLOOD SUGAR DAILY 102 each 0   ACCU-CHEK GUIDE test strip USE AS DIRECTED TO CHECK BLOOD SUGAR DAILY 50 strip 0   acetaminophen (TYLENOL) 500 MG tablet Take 500 mg by mouth every 6 (six) hours as needed.     Alcohol Swabs (ALCOHOL PREPS) PADS Use as directed when testing blood glucose 100 each 5   amLODipine (NORVASC) 5 MG tablet Take 1 tablet (5 mg total) by mouth daily. 90 tablet 1   aspirin EC 81 MG tablet Take 81 mg by mouth daily.     bimatoprost (LUMIGAN) 0.01 % SOLN Instill 1  drop into both eyes at bedtime 7.5 mL 3   blood glucose meter kit and supplies Accuchek meter (per patient request) once daily testing  DX e11.9 1 each 0   calcium-vitamin D (OSCAL WITH D) 500-200 MG-UNIT per tablet Take 1 tablet by mouth 2 (two) times daily.     cloNIDine (CATAPRES) 0.3 MG tablet Take 1 tablet (0.3 mg total) by mouth at bedtime. 30 tablet 6   metoprolol succinate (TOPROL-XL) 25 MG 24 hr tablet TAKE 1 TABLET BY MOUTH ONCE DAILY 90 tablet 3   pantoprazole (PROTONIX) 40 MG tablet Take 1 tablet (40 mg total) by mouth daily. 30 tablet 5   RESTASIS 0.05 % ophthalmic emulsion      rosuvastatin (CRESTOR) 40 MG tablet TAKE 1 TABLET (40 MG TOTAL) BY MOUTH DAILY. 90 tablet 2   Semaglutide, 1 MG/DOSE, 4 MG/3ML SOPN Inject 1 mg as directed once a week. 3 mL 3   triamterene-hydrochlorothiazide (MAXZIDE) 75-50 MG tablet TAKE 1 TABLET BY MOUTH DAILY. 30 tablet 11   Vitamin D, Ergocalciferol, (DRISDOL) 1.25 MG (50000 UNIT) CAPS capsule TAKE 1 CAPSULE (50,000 UNITS) BY MOUTH ONCE A WEEK. 4 capsule 5   No current facility-administered medications for this visit.   Allergies:  Peanut-containing drug products, Celecoxib, Metaxalone, Metformin and related, Nolamine [chlorphen-phenind-phenylprop], Ozempic (0.25 or 0.5 mg-dose) [semaglutide(0.25 or 0.76m-dos)], Sitagliptin-metformin hcl, and Skelaxin   ROS:  No syncope.  Physical Exam: VS:  BP (!) 138/90   Pulse 67   Ht _0  (1.626 m)   Wt 235 lb (106.6 kg)   SpO2 99%   BMI 40.34 kg/m , BMI Body mass index is 40.34 kg/m.  Wt Readings from Last 3 Encounters:  10/09/22 235 lb (106.6 kg)  09/04/22 229 lb (103.9 kg)  06/04/22 221 lb 1.3 oz (100.3 kg)    General: Patient appears comfortable at rest. HEENT: Conjunctiva and lids normal Neck: Supple, no elevated JVP or carotid bruits. Lungs: Clear to auscultation, nonlabored breathing at rest. Cardiac: Regular rate and rhythm, no S3, 1/6 systolic murmur. Extremities: No pitting  edema.  ECG:  An ECG dated 08/15/2021 was personally reviewed today and demonstrated:  Sinus rhythm with prolonged PR interval, left anterior fascicular block, poor R wave progression.  Recent Labwork: 08/29/2022: ALT 27; AST 24; BUN 17; Creatinine, Ser 1.03; Hemoglobin 11.5; Platelets 380; Potassium 3.9; Sodium 144; TSH 1.710     Component Value Date/Time   CHOL 163 08/29/2022 1014   TRIG 69 08/29/2022 1014   HDL 77 08/29/2022 1014   CHOLHDL 2.1 08/29/2022 1014   CHOLHDL 2.6 11/14/2020 0811   VLDL 17 06/17/2017 1005   LDLCALC 73 08/29/2022 1014   LDLCALC 76 11/14/2020 0811    Other Studies Reviewed Today:  Cardiac monitor May 2017: 48 hour Holter monitor. Sinus rhythm noted. Heart rate ranged from 48 bpm up to 100 bpm with average heart rate 75 bpm. There were episodes of regular SVT at 129, 142, and 148 bpm noted transiently. Rare PACs and PVCs. No pauses.   Echocardiogram 03/31/2016: - Left ventricle: The cavity size was normal. Wall thickness was    increased in a pattern of mild LVH. Systolic function was normal.    The estimated ejection fraction was in the range of 55% to 60%.    Wall motion was normal; there were no regional wall motion    abnormalities. Features are consistent with a pseudonormal left    ventricular filling pattern, with concomitant abnormal relaxation    and increased filling pressure (grade 2 diastolic dysfunction).  - Aortic valve: Trileaflet; mildly calcified leaflets.  - Mitral valve: Calcified annulus. There was trivial regurgitation.  - Left atrium: The atrium was at the upper limits of normal in    size.  - Atrial septum: No defect or patent foramen ovale was identified.  - Tricuspid valve: There was trivial regurgitation. Peak RV-RA    gradient (S): 24 mm Hg.  - Pulmonary arteries: Systolic pressure could not be accurately    estimated.  - Pericardium, extracardiac: There was no pericardial effusion.   Impressions:   - Mild LVH with LVEF  55-60%. Rate 2 diastolic dysfunction with    increased LV filling pressure. Upper normal left atrial chamber    size. MAC with trivial mitral regurgitation. Mildly sclerotic    aortic valve. Trivial tricuspid regurgitation with RV-RA gradient    24 mmHg.  Assessment and Plan:  1.  History of palpitations and brief PSVT, doing very well on low-dose Toprol-XL.  ECG reviewed and stable.  Plan to continue observation at this point.  2.  Essential hypertension on multimodal therapy.  She is on clonidine, Norvasc, and Toprol-XL.  Keep follow-up with Dr. Moshe Cipro.  3.  Mixed hyperlipidemia, doing well on Crestor with last LDL 73.  Medication Adjustments/Labs and Tests Ordered: Current medicines are reviewed at length with the patient today.  Concerns regarding medicines are  outlined above.   Tests Ordered: Orders Placed This Encounter  Procedures   EKG 12-Lead    Medication Changes: No orders of the defined types were placed in this encounter.   Disposition:  Follow up  1 year.  Signed, Satira Sark, MD, [facility] 10/09/2022 4:26 PM    [place] [facility] [address] [place], [place] [address] Phone: [phone]; Fax: [phone]

## 2022-10-15 ENCOUNTER — Other Ambulatory Visit (HOSPITAL_BASED_OUTPATIENT_CLINIC_OR_DEPARTMENT_OTHER): Payer: Self-pay

## 2022-10-15 ENCOUNTER — Other Ambulatory Visit: Payer: Self-pay | Admitting: Family Medicine

## 2022-10-15 MED ORDER — ROSUVASTATIN CALCIUM 40 MG PO TABS
40.0000 mg | ORAL_TABLET | Freq: Every day | ORAL | 2 refills | Status: DC
  Filled 2022-10-15: qty 90, 90d supply, fill #0
  Filled 2023-01-15: qty 90, 90d supply, fill #1
  Filled 2023-02-24: qty 90, 90d supply, fill #0

## 2022-10-17 ENCOUNTER — Other Ambulatory Visit (HOSPITAL_BASED_OUTPATIENT_CLINIC_OR_DEPARTMENT_OTHER): Payer: Self-pay

## 2022-11-18 ENCOUNTER — Other Ambulatory Visit (HOSPITAL_BASED_OUTPATIENT_CLINIC_OR_DEPARTMENT_OTHER): Payer: Self-pay

## 2022-11-18 ENCOUNTER — Other Ambulatory Visit: Payer: Self-pay | Admitting: Family Medicine

## 2022-11-21 ENCOUNTER — Other Ambulatory Visit (HOSPITAL_BASED_OUTPATIENT_CLINIC_OR_DEPARTMENT_OTHER): Payer: Self-pay

## 2022-11-21 ENCOUNTER — Other Ambulatory Visit: Payer: Self-pay | Admitting: Family Medicine

## 2022-11-25 ENCOUNTER — Other Ambulatory Visit (HOSPITAL_BASED_OUTPATIENT_CLINIC_OR_DEPARTMENT_OTHER): Payer: Self-pay

## 2022-11-26 ENCOUNTER — Other Ambulatory Visit (HOSPITAL_BASED_OUTPATIENT_CLINIC_OR_DEPARTMENT_OTHER): Payer: Self-pay

## 2022-11-26 MED ORDER — BIMATOPROST 0.03 % OP SOLN
1.0000 [drp] | Freq: Every day | OPHTHALMIC | 4 refills | Status: DC
  Filled 2022-11-26: qty 5, 30d supply, fill #0

## 2022-11-27 ENCOUNTER — Other Ambulatory Visit (HOSPITAL_BASED_OUTPATIENT_CLINIC_OR_DEPARTMENT_OTHER): Payer: Self-pay

## 2022-12-03 ENCOUNTER — Other Ambulatory Visit (HOSPITAL_BASED_OUTPATIENT_CLINIC_OR_DEPARTMENT_OTHER): Payer: Self-pay

## 2022-12-03 ENCOUNTER — Other Ambulatory Visit: Payer: Self-pay | Admitting: Family Medicine

## 2022-12-03 MED ORDER — LUMIGAN 0.01 % OP SOLN
1.0000 [drp] | Freq: Every day | OPHTHALMIC | 6 refills | Status: DC
  Filled 2022-12-03 – 2023-07-21 (×3): qty 2.5, 50d supply, fill #0

## 2022-12-03 MED ORDER — BIMATOPROST 0.03 % OP SOLN
1.0000 [drp] | Freq: Every day | OPHTHALMIC | 4 refills | Status: DC
  Filled 2022-12-03: qty 5, 100d supply, fill #0
  Filled 2023-01-15 – 2023-01-19 (×3): qty 5, 50d supply, fill #1

## 2022-12-04 ENCOUNTER — Other Ambulatory Visit (HOSPITAL_BASED_OUTPATIENT_CLINIC_OR_DEPARTMENT_OTHER): Payer: Self-pay

## 2022-12-17 LAB — BMP8+EGFR
BUN/Creatinine Ratio: 14 (ref 12–28)
BUN: 15 mg/dL (ref 8–27)
CO2: 27 mmol/L (ref 20–29)
Calcium: 10.1 mg/dL (ref 8.7–10.3)
Chloride: 103 mmol/L (ref 96–106)
Creatinine, Ser: 1.09 mg/dL — ABNORMAL HIGH (ref 0.57–1.00)
Glucose: 97 mg/dL (ref 70–99)
Potassium: 3.5 mmol/L (ref 3.5–5.2)
Sodium: 144 mmol/L (ref 134–144)
eGFR: 55 mL/min/{1.73_m2} — ABNORMAL LOW (ref >59)

## 2022-12-17 LAB — HEMOGLOBIN A1C
Est. average glucose Bld gHb Est-mCnc: 148 mg/dL
Hgb A1c MFr Bld: 6.8 % — ABNORMAL HIGH (ref 4.8–5.6)

## 2022-12-18 ENCOUNTER — Ambulatory Visit (INDEPENDENT_AMBULATORY_CARE_PROVIDER_SITE_OTHER): Payer: Medicare Other | Admitting: Family Medicine

## 2022-12-18 ENCOUNTER — Encounter: Payer: Self-pay | Admitting: Family Medicine

## 2022-12-18 VITALS — BP 142/82 | HR 67 | Ht 63.75 in | Wt 232.0 lb

## 2022-12-18 DIAGNOSIS — E1159 Type 2 diabetes mellitus with other circulatory complications: Secondary | ICD-10-CM | POA: Diagnosis not present

## 2022-12-18 DIAGNOSIS — E782 Mixed hyperlipidemia: Secondary | ICD-10-CM | POA: Diagnosis not present

## 2022-12-18 DIAGNOSIS — I10 Essential (primary) hypertension: Secondary | ICD-10-CM | POA: Diagnosis not present

## 2022-12-18 NOTE — Patient Instructions (Addendum)
Annual exam with MD and BP re evaluation the Feb 13 or 14, Microalb to be submitted  visit ( due Feb 10 or after)  F/u with MD in 13 to 14 weeks, re eval chronic problems  Fasting lipid, cmp and eGFr, hBA1C 3 to 5 days before May appt  CONGRATS on improved blood sugar  Bp still slightly hjigh Increase amlodipinee to 5 mg TWO daily ( total dose of 10 mg) , continue other medications as before  Thanks for choosing [facility], we consider it a privelige to serve you.

## 2022-12-18 NOTE — Progress Notes (Signed)
Tina Wilkins     MRN: [id number]      DOB: [birth year]   HPI Tina Wilkins is here for follow up and re-evaluation of chronic medical conditions, medication management and review of any available recent lab and radiology data.  Preventive health is updated, specifically  Cancer screening and Immunization.   Questions or concerns regarding consultations or procedures which the PT has had in the interim are  addressed. The PT denies any adverse reactions to current medications since the last visit.  Sprained right 5th toe approx 3 weeks ago when she missed her step at [facility], improved significantly Denies polyuria, polydipsia, blurred vision , or hypoglycemic episodes.    ROS Denies recent fever or chills. Denies sinus pressure, nasal congestion, ear pain or sore throat. Denies chest congestion, productive cough or wheezing. Denies chest pains, palpitations and leg swelling Denies abdominal pain, nausea, vomiting,diarrhea or constipation.   Denies dysuria, frequency, hesitancy or incontinence. . Denies headaches, seizures, numbness, or tingling. Denies depression, anxiety or insomnia. Denies skin break down or rash.   PE  BP (!) 142/82   Pulse 67   Ht 5' 3.75" (1.619 m)   Wt 232 lb (105.2 kg)   SpO2 96%   BMI 40.14 kg/m   Patient alert and oriented and in no cardiopulmonary distress.  HEENT: No facial asymmetry, EOMI,     Neck supple .  Chest: Clear to auscultation bilaterally.  CVS: S1, S2 no murmurs, no S3.Regular rate.  ABD: Soft non tender.   Ext: No edema  MS: Adequate ROM spine, shoulders, hips and knees.  Skin: Intact, no ulcerations or rash noted.  Psych: Good eye contact, normal affect. Memory intact not anxious or depressed appearing.  CNS: CN 2-12 intact, power,  normal throughout.no focal deficits noted.   Assessment & Plan  Hypertension goal BP (blood pressure) < 130/80 Uncontrolled, inc amlodipine to 10 mg daily DASH diet and commitment to  daily physical activity for a minimum of 30 minutes discussed and encouraged, as a part of hypertension management. The importance of attaining a healthy weight is also discussed.     12/18/2022   11:13 AM 12/18/2022   11:12 AM 12/18/2022   10:57 AM 10/09/2022    3:58 PM 09/04/2022    9:45 AM 06/04/2022   10:14 AM 06/04/2022    9:55 AM  BP/Weight  Systolic BP 785 885 027 741 287 867 672  Diastolic BP 82 80 85 90 82 77 81  Wt. (Lbs)   232 235 229  221.08  BMI   40.14 kg/m2 40.34 kg/m2 40.57 kg/m2  39.48 kg/m2       Type 2 diabetes mellitus with vascular disease ([place]) Improved and well controlled No med change Tina Wilkins is reminded of the importance of commitment to daily physical activity for 30 minutes or more, as able and the need to limit carbohydrate intake to 30 to 60 grams per meal to help with blood sugar control.   The need to take medication as prescribed, test blood sugar as directed, and to call between visits if there is a concern that blood sugar is uncontrolled is also discussed.   Tina Wilkins is reminded of the importance of daily foot exam, annual eye examination, and good blood sugar, blood pressure and cholesterol control.     Latest Ref Rng & Units 12/16/2022   11:12 AM 08/29/2022   10:14 AM 04/11/2022    4:17 PM 02/07/2022    2:42 PM 01/09/2022  11:51 AM  Diabetic Labs  HbA1c 4.8 - 5.6 % 6.8  7.5  6.9   6.9   Micro/Creat Ratio 0 - 29 mg/g creat     82   Chol 100 - 199 mg/dL  163      HDL >39 mg/dL  77      Calc LDL 0 - 99 mg/dL  73      Triglycerides 0 - 149 mg/dL  69      Creatinine 0.57 - 1.00 mg/dL 1.09  1.03  1.24  1.07  1.48       12/18/2022   11:13 AM 12/18/2022   11:12 AM 12/18/2022   10:57 AM 10/09/2022    3:58 PM 09/04/2022    9:45 AM 06/04/2022   10:14 AM 06/04/2022    9:55 AM  BP/Weight  Systolic BP 314 970 263 785 885 027 741  Diastolic BP 82 80 85 90 82 77 81  Wt. (Lbs)   232 235 229  221.08  BMI   40.14 kg/m2 40.34 kg/m2 40.57 kg/m2  39.48 kg/m2       01/15/2022    8:40 AM 11/15/2020    9:40 AM  Foot/eye exam completion dates  Foot Form Completion Done Done        Morbid obesity  Patient re-educated about  the importance of commitment to a  minimum of 150 minutes of exercise per week as able.  The importance of healthy food choices with portion control discussed, as well as eating regularly and within a 12 hour window most days. The need to choose "clean , green" food 50 to 75% of the time is discussed, as well as to make water the primary drink and set a goal of 64 ounces water daily.       12/18/2022   10:57 AM 10/09/2022    3:58 PM 09/04/2022    9:45 AM  Weight /BMI  Weight 232 lb 235 lb 229 lb  Height 5' 3.75" (1.619 m) 5\' 4"  (1.626 m) 5\' 3"  (1.6 m)  BMI 40.14 kg/m2 40.34 kg/m2 40.57 kg/m2      Hyperlipidemia Hyperlipidemia:Low fat diet discussed and encouraged.   Lipid Panel  Lab Results  Component Value Date   CHOL 163 08/29/2022   HDL 77 08/29/2022   LDLCALC 73 08/29/2022   TRIG 69 08/29/2022   CHOLHDL 2.1 08/29/2022     Controlled, no change in medication Updated lab needed at/ before next visit.

## 2022-12-18 NOTE — Assessment & Plan Note (Signed)
Improved and well controlled No med change Tina Wilkins is reminded of the importance of commitment to daily physical activity for 30 minutes or more, as able and the need to limit carbohydrate intake to 30 to 60 grams per meal to help with blood sugar control.   The need to take medication as prescribed, test blood sugar as directed, and to call between visits if there is a concern that blood sugar is uncontrolled is also discussed.   Tina Wilkins is reminded of the importance of daily foot exam, annual eye examination, and good blood sugar, blood pressure and cholesterol control.     Latest Ref Rng & Units 12/16/2022   11:12 AM 08/29/2022   10:14 AM 04/11/2022    4:17 PM 02/07/2022    2:42 PM 01/09/2022   11:51 AM  Diabetic Labs  HbA1c 4.8 - 5.6 % 6.8  7.5  6.9   6.9   Micro/Creat Ratio 0 - 29 mg/g creat     82   Chol 100 - 199 mg/dL  163      HDL >39 mg/dL  77      Calc LDL 0 - 99 mg/dL  73      Triglycerides 0 - 149 mg/dL  69      Creatinine 0.57 - 1.00 mg/dL 1.09  1.03  1.24  1.07  1.48       12/18/2022   11:13 AM 12/18/2022   11:12 AM 12/18/2022   10:57 AM 10/09/2022    3:58 PM 09/04/2022    9:45 AM 06/04/2022   10:14 AM 06/04/2022    9:55 AM  BP/Weight  Systolic BP 098 119 147 829 562 130 865  Diastolic BP 82 80 85 90 82 77 81  Wt. (Lbs)   232 235 229  221.08  BMI   40.14 kg/m2 40.34 kg/m2 40.57 kg/m2  39.48 kg/m2      01/15/2022    8:40 AM 11/15/2020    9:40 AM  Foot/eye exam completion dates  Foot Form Completion Done Done

## 2022-12-18 NOTE — Assessment & Plan Note (Signed)
Hyperlipidemia:Low fat diet discussed and encouraged.   Lipid Panel  Lab Results  Component Value Date   CHOL 163 08/29/2022   HDL 77 08/29/2022   LDLCALC 73 08/29/2022   TRIG 69 08/29/2022   CHOLHDL 2.1 08/29/2022     Controlled, no change in medication Updated lab needed at/ before next visit.

## 2022-12-18 NOTE — Assessment & Plan Note (Signed)
  Patient re-educated about  the importance of commitment to a  minimum of 150 minutes of exercise per week as able.  The importance of healthy food choices with portion control discussed, as well as eating regularly and within a 12 hour window most days. The need to choose "clean , green" food 50 to 75% of the time is discussed, as well as to make water the primary drink and set a goal of 64 ounces water daily.       12/18/2022   10:57 AM 10/09/2022    3:58 PM 09/04/2022    9:45 AM  Weight /BMI  Weight 232 lb 235 lb 229 lb  Height 5' 3.75" (1.619 m) 5\' 4"  (1.626 m) 5\' 3"  (1.6 m)  BMI 40.14 kg/m2 40.34 kg/m2 40.57 kg/m2

## 2022-12-18 NOTE — Assessment & Plan Note (Signed)
Uncontrolled, inc amlodipine to 10 mg daily DASH diet and commitment to daily physical activity for a minimum of 30 minutes discussed and encouraged, as a part of hypertension management. The importance of attaining a healthy weight is also discussed.     12/18/2022   11:13 AM 12/18/2022   11:12 AM 12/18/2022   10:57 AM 10/09/2022    3:58 PM 09/04/2022    9:45 AM 06/04/2022   10:14 AM 06/04/2022    9:55 AM  BP/Weight  Systolic BP 903 833 383 291 916 606 004  Diastolic BP 82 80 85 90 82 77 81  Wt. (Lbs)   232 235 229  221.08  BMI   40.14 kg/m2 40.34 kg/m2 40.57 kg/m2  39.48 kg/m2

## 2022-12-22 ENCOUNTER — Other Ambulatory Visit (HOSPITAL_BASED_OUTPATIENT_CLINIC_OR_DEPARTMENT_OTHER): Payer: Self-pay

## 2022-12-25 ENCOUNTER — Ambulatory Visit (HOSPITAL_COMMUNITY)
Admission: RE | Admit: 2022-12-25 | Discharge: 2022-12-25 | Disposition: A | Discharge status: Home / Self Care | Payer: Medicare Other | Source: Ambulatory Visit | Attending: Family Medicine | Admitting: Family Medicine

## 2022-12-25 DIAGNOSIS — Z1231 Encounter for screening mammogram for malignant neoplasm of breast: Secondary | ICD-10-CM | POA: Diagnosis not present

## 2023-01-14 ENCOUNTER — Encounter: Payer: Self-pay | Admitting: Family Medicine

## 2023-01-14 ENCOUNTER — Ambulatory Visit (INDEPENDENT_AMBULATORY_CARE_PROVIDER_SITE_OTHER): Payer: Medicare Other | Admitting: Family Medicine

## 2023-01-14 VITALS — BP 120/73 | HR 66 | Ht 63.0 in | Wt 235.0 lb

## 2023-01-14 DIAGNOSIS — E1159 Type 2 diabetes mellitus with other circulatory complications: Secondary | ICD-10-CM | POA: Diagnosis not present

## 2023-01-14 DIAGNOSIS — Z0001 Encounter for general adult medical examination with abnormal findings: Secondary | ICD-10-CM

## 2023-01-14 NOTE — Assessment & Plan Note (Signed)
Annual exam as documented. Counseling done  re healthy lifestyle involving commitment to 150 minutes exercise per week, heart healthy diet, and attaining healthy weight.The importance of adequate sleep also discussed. Changes in health habits are decided on by the patient with goals and time frames  set for achieving them. Immunization and cancer screening needs are specifically addressed at this visit. 

## 2023-01-14 NOTE — Patient Instructions (Signed)
F/U in 10 weeks, call if you need me sooner  Please get fasting labs 5 days before next appt  Weight loss goal of 10 pounds  Microalb today  Good foot exam   Please get covid booster and RSV vaccines at your pharmacy  It is important that you exercise regularly at least 30 minutes 5 times a week. If you develop chest pain, have severe difficulty breathing, or feel very tired, stop exercising immediately and seek medical attention   Thanks for choosing [facility], we consider it a privelige to serve you.

## 2023-01-14 NOTE — Assessment & Plan Note (Signed)
Tina Wilkins is reminded of the importance of commitment to daily physical activity for 30 minutes or more, as able and the need to limit carbohydrate intake to 30 to 60 grams per meal to help with blood sugar control.   The need to take medication as prescribed, test blood sugar as directed, and to call between visits if there is a concern that blood sugar is uncontrolled is also discussed.   Tina Wilkins is reminded of the importance of daily foot exam, annual eye examination, and good blood sugar, blood pressure and cholesterol control.     Latest Ref Rng & Units 12/16/2022   11:12 AM 08/29/2022   10:14 AM 04/11/2022    4:17 PM 02/07/2022    2:42 PM 01/09/2022   11:51 AM  Diabetic Labs  HbA1c 4.8 - 5.6 % 6.8  7.5  6.9   6.9   Micro/Creat Ratio 0 - 29 mg/g creat     82   Chol 100 - 199 mg/dL  163      HDL >39 mg/dL  77      Calc LDL 0 - 99 mg/dL  73      Triglycerides 0 - 149 mg/dL  69      Creatinine 0.57 - 1.00 mg/dL 1.09  1.03  1.24  1.07  1.48       01/14/2023    8:06 AM 12/18/2022   11:13 AM 12/18/2022   11:12 AM 12/18/2022   10:57 AM 10/09/2022    3:58 PM 09/04/2022    9:45 AM 06/04/2022   10:14 AM  BP/Weight  Systolic BP 123456 [id number] 0000000 [id number]  Diastolic BP 73 82 80 85 90 82 77  Wt. (Lbs) 235.04   232 235 229   BMI 41.64 kg/m2   40.14 kg/m2 40.34 kg/m2 40.57 kg/m2       01/14/2023    8:00 AM 01/15/2022    8:40 AM  Foot/eye exam completion dates  Foot Form Completion Done Done      Updated lab needed at/ before next visit.

## 2023-01-14 NOTE — Progress Notes (Signed)
    Tina Wilkins     MRN: [id number]      DOB: 1956/04/08  HPI: Patient is in for annual physical exam. No other health concerns are expressed or addressed at the visit. Recent labs,  are reviewed. Immunization is reviewed , and  updated if needed.   PE: BP 120/73 (BP Location: Right Arm, Patient Position: Sitting, Cuff Size: Large)   Pulse 66   Ht 5\' 3"  (1.6 m)   Wt 235 lb 0.6 oz (106.6 kg)   SpO2 96%   BMI 41.64 kg/m    Pleasant  female, alert and oriented x 3, in no cardio-pulmonary distress. Afebrile. HEENT No facial trauma or asymetry. Sinuses non tender.  Extra occullar muscles intact.. External ears normal, . Neck: supple, no adenopathy,JVD or thyromegaly.No bruits.  Chest: Clear to ascultation bilaterally.No crackles or wheezes. Non tender to palpation  Cardiovascular system; Heart sounds normal,  S1 and  S2 ,no S3.  No murmur, or thrill. Apical beat not displaced Peripheral pulses normal.  Abdomen: Soft, non tender, no organomegaly or masses. No bruits. Bowel sounds normal. No guarding, tenderness or rebound.    Musculoskeletal exam: Full ROM of spine, hips , shoulders and knees. No deformity ,swelling or crepitus noted. No muscle wasting or atrophy.   Neurologic: Cranial nerves 2 to 12 intact. Power, tone ,sensation and reflexes normal throughout. No disturbance in gait. No tremor.  Skin: Intact, no ulceration, erythema , scaling or rash noted. Pigmentation normal throughout  Psych; Normal mood and affect. Judgement and concentration normal   Assessment & Plan:  Encounter for Medicare annual examination with abnormal findings Annual exam as documented. Counseling done  re healthy lifestyle involving commitment to 150 minutes exercise per week, heart healthy diet, and attaining healthy weight.The importance of adequate sleep also discussed.  Changes in health habits are decided on by the patient with goals and time frames  set for  achieving them. Immunization and cancer screening needs are specifically addressed at this visit.

## 2023-01-15 ENCOUNTER — Other Ambulatory Visit: Payer: Self-pay | Admitting: Cardiology

## 2023-01-15 ENCOUNTER — Other Ambulatory Visit: Payer: Self-pay

## 2023-01-15 ENCOUNTER — Other Ambulatory Visit (HOSPITAL_BASED_OUTPATIENT_CLINIC_OR_DEPARTMENT_OTHER): Payer: Self-pay

## 2023-01-15 MED ORDER — METOPROLOL SUCCINATE ER 25 MG PO TB24
25.0000 mg | ORAL_TABLET | Freq: Every day | ORAL | 3 refills | Status: DC
  Filled 2023-01-15: qty 90, 90d supply, fill #0

## 2023-01-16 LAB — MICROALBUMIN / CREATININE URINE RATIO
Creatinine, Urine: 83.9 mg/dL
Microalb/Creat Ratio: 25 mg/g creat (ref 0–29)
Microalbumin, Urine: 20.7 ug/mL

## 2023-01-19 ENCOUNTER — Other Ambulatory Visit (HOSPITAL_BASED_OUTPATIENT_CLINIC_OR_DEPARTMENT_OTHER): Payer: Self-pay

## 2023-01-30 ENCOUNTER — Other Ambulatory Visit (HOSPITAL_BASED_OUTPATIENT_CLINIC_OR_DEPARTMENT_OTHER): Payer: Self-pay

## 2023-02-11 ENCOUNTER — Ambulatory Visit: Payer: Medicare Other | Admitting: Cardiology

## 2023-02-22 ENCOUNTER — Other Ambulatory Visit: Payer: Self-pay | Admitting: Family Medicine

## 2023-02-23 ENCOUNTER — Other Ambulatory Visit (HOSPITAL_BASED_OUTPATIENT_CLINIC_OR_DEPARTMENT_OTHER): Payer: Self-pay

## 2023-02-23 MED ORDER — CLONIDINE HCL 0.3 MG PO TABS
0.3000 mg | ORAL_TABLET | Freq: Every day | ORAL | 6 refills | Status: DC
  Filled 2023-02-23 – 2023-03-30 (×3): qty 30, 30d supply, fill #0

## 2023-02-24 ENCOUNTER — Telehealth: Payer: Self-pay | Admitting: Family Medicine

## 2023-02-24 ENCOUNTER — Other Ambulatory Visit: Payer: Self-pay

## 2023-02-24 ENCOUNTER — Other Ambulatory Visit (HOSPITAL_BASED_OUTPATIENT_CLINIC_OR_DEPARTMENT_OTHER): Payer: Self-pay

## 2023-02-24 ENCOUNTER — Other Ambulatory Visit (HOSPITAL_COMMUNITY): Payer: Self-pay

## 2023-02-24 ENCOUNTER — Other Ambulatory Visit: Payer: Self-pay | Admitting: Family Medicine

## 2023-02-24 DIAGNOSIS — I10 Essential (primary) hypertension: Secondary | ICD-10-CM

## 2023-02-24 MED ORDER — METOPROLOL SUCCINATE ER 25 MG PO TB24
25.0000 mg | ORAL_TABLET | Freq: Every day | ORAL | 3 refills | Status: DC
  Filled 2023-02-24 – 2023-04-15 (×2): qty 90, 90d supply, fill #0
  Filled 2023-07-20: qty 90, 90d supply, fill #1
  Filled 2023-10-18: qty 90, 90d supply, fill #2
  Filled 2024-01-21: qty 90, 90d supply, fill #3

## 2023-02-24 MED ORDER — TRIAMTERENE-HCTZ 75-50 MG PO TABS
1.0000 | ORAL_TABLET | Freq: Every day | ORAL | 11 refills | Status: DC
  Filled 2023-02-24 – 2023-03-30 (×3): qty 30, 30d supply, fill #0

## 2023-02-24 MED ORDER — ACCU-CHEK FASTCLIX LANCETS MISC
0 refills | Status: AC
  Filled 2023-02-24: qty 102, 30d supply, fill #0

## 2023-02-24 MED ORDER — AMLODIPINE BESYLATE 5 MG PO TABS
ORAL_TABLET | ORAL | 1 refills | Status: DC
  Filled 2023-02-24 – 2023-03-30 (×2): qty 90, 45d supply, fill #0

## 2023-02-24 MED ORDER — SEMAGLUTIDE (1 MG/DOSE) 4 MG/3ML ~~LOC~~ SOPN
1.0000 mg | PEN_INJECTOR | SUBCUTANEOUS | 3 refills | Status: DC
  Filled 2023-02-24: qty 3, 28d supply, fill #0

## 2023-02-24 MED ORDER — ROSUVASTATIN CALCIUM 40 MG PO TABS
40.0000 mg | ORAL_TABLET | Freq: Every day | ORAL | 2 refills | Status: DC
  Filled 2023-02-24 – 2023-04-22 (×2): qty 90, 90d supply, fill #0
  Filled 2023-07-20: qty 90, 90d supply, fill #1
  Filled 2023-10-18: qty 90, 90d supply, fill #2

## 2023-02-24 NOTE — Telephone Encounter (Signed)
Prescription Request  02/24/2023  LOV: 01/14/2023  What is the name of the medication or equipment? Ozempic 1 MG  Accu-Chek FastClix Lancets MISC [id number]   Accu-Chek FastClix Lancets MISC [id number]    amLODipine (NORVASC) 5 MG tablet [id number]    rosuvastatin (CRESTOR) 40 MG tablet [id number]   triamterene-hydrochlorothiazide (MAXZIDE) 75-50 MG tablet [id number]   metoprolol succinate (TOPROL-XL) 25 MG 24 hr table   metoprolol succinate (TOPROL-XL) 25 MG 24 hr table   Have you contacted your pharmacy to request a refill? Yes   Which pharmacy would you like this sent to?    Pharmacy: [facility] [address], [place] [address] Phone: [phone]  Fax: [phone]   Patient notified that their request is being sent to the clinical staff for review and that they should receive a response within 2 business days.   Please advise at Mobile [phone] (mobile)

## 2023-02-24 NOTE — Telephone Encounter (Signed)
Refills sent

## 2023-02-27 ENCOUNTER — Other Ambulatory Visit (HOSPITAL_COMMUNITY): Payer: Self-pay

## 2023-03-02 ENCOUNTER — Other Ambulatory Visit (HOSPITAL_COMMUNITY): Payer: Self-pay

## 2023-03-02 ENCOUNTER — Other Ambulatory Visit (HOSPITAL_BASED_OUTPATIENT_CLINIC_OR_DEPARTMENT_OTHER): Payer: Self-pay

## 2023-03-07 ENCOUNTER — Other Ambulatory Visit (HOSPITAL_COMMUNITY): Payer: Self-pay

## 2023-03-12 ENCOUNTER — Other Ambulatory Visit (HOSPITAL_COMMUNITY): Payer: Self-pay

## 2023-03-16 ENCOUNTER — Other Ambulatory Visit (HOSPITAL_COMMUNITY): Payer: Self-pay

## 2023-03-24 LAB — CMP14+EGFR
ALT: 38 IU/L — ABNORMAL HIGH (ref 0–32)
AST: 29 IU/L (ref 0–40)
Albumin/Globulin Ratio: 1.6 (ref 1.2–2.2)
Albumin: 4.4 g/dL (ref 3.9–4.9)
Alkaline Phosphatase: 107 IU/L (ref 44–121)
BUN/Creatinine Ratio: 14 (ref 12–28)
BUN: 15 mg/dL (ref 8–27)
Bilirubin Total: 0.8 mg/dL (ref 0.0–1.2)
CO2: 27 mmol/L (ref 20–29)
Calcium: 9.8 mg/dL (ref 8.7–10.3)
Chloride: 101 mmol/L (ref 96–106)
Creatinine, Ser: 1.04 mg/dL — ABNORMAL HIGH (ref 0.57–1.00)
Globulin, Total: 2.7 g/dL (ref 1.5–4.5)
Glucose: 105 mg/dL — ABNORMAL HIGH (ref 70–99)
Potassium: 3.5 mmol/L (ref 3.5–5.2)
Sodium: 144 mmol/L (ref 134–144)
Total Protein: 7.1 g/dL (ref 6.0–8.5)
eGFR: 59 mL/min/{1.73_m2} — ABNORMAL LOW (ref >59)

## 2023-03-24 LAB — HEMOGLOBIN A1C
Est. average glucose Bld gHb Est-mCnc: 151 mg/dL
Hgb A1c MFr Bld: 6.9 % — ABNORMAL HIGH (ref 4.8–5.6)

## 2023-03-24 LAB — LIPID PANEL
Chol/HDL Ratio: 2.1 ratio (ref 0.0–4.4)
Cholesterol, Total: 155 mg/dL (ref 100–199)
HDL: 73 mg/dL (ref >39)
LDL Chol Calc (NIH): 67 mg/dL (ref 0–99)
Triglycerides: 77 mg/dL (ref 0–149)
VLDL Cholesterol Cal: 15 mg/dL (ref 5–40)

## 2023-03-25 ENCOUNTER — Ambulatory Visit: Payer: Medicare Other | Admitting: Family Medicine

## 2023-03-25 LAB — MICROALBUMIN / CREATININE URINE RATIO
Creatinine, Urine: 184.2 mg/dL
Microalb/Creat Ratio: 16 mg/g creat (ref 0–29)
Microalbumin, Urine: 30.3 ug/mL

## 2023-03-26 ENCOUNTER — Encounter: Payer: Self-pay | Admitting: Family Medicine

## 2023-03-26 ENCOUNTER — Other Ambulatory Visit (HOSPITAL_COMMUNITY): Payer: Self-pay

## 2023-03-26 ENCOUNTER — Other Ambulatory Visit: Payer: Self-pay

## 2023-03-30 ENCOUNTER — Other Ambulatory Visit (HOSPITAL_COMMUNITY): Payer: Self-pay

## 2023-03-31 ENCOUNTER — Other Ambulatory Visit: Payer: Self-pay

## 2023-03-31 ENCOUNTER — Other Ambulatory Visit (HOSPITAL_COMMUNITY): Payer: Self-pay

## 2023-04-02 ENCOUNTER — Other Ambulatory Visit (HOSPITAL_COMMUNITY): Payer: Self-pay

## 2023-04-02 ENCOUNTER — Other Ambulatory Visit: Payer: Self-pay

## 2023-04-02 ENCOUNTER — Encounter: Payer: Self-pay | Admitting: Family Medicine

## 2023-04-02 ENCOUNTER — Ambulatory Visit (INDEPENDENT_AMBULATORY_CARE_PROVIDER_SITE_OTHER): Payer: Medicare Other | Admitting: Family Medicine

## 2023-04-02 VITALS — BP 158/90 | HR 68 | Ht 63.0 in | Wt 242.0 lb

## 2023-04-02 DIAGNOSIS — E1159 Type 2 diabetes mellitus with other circulatory complications: Secondary | ICD-10-CM

## 2023-04-02 DIAGNOSIS — I10 Essential (primary) hypertension: Secondary | ICD-10-CM

## 2023-04-02 DIAGNOSIS — E782 Mixed hyperlipidemia: Secondary | ICD-10-CM

## 2023-04-02 DIAGNOSIS — E559 Vitamin D deficiency, unspecified: Secondary | ICD-10-CM

## 2023-04-02 MED ORDER — SEMAGLUTIDE (1 MG/DOSE) 4 MG/3ML ~~LOC~~ SOPN
1.0000 mg | PEN_INJECTOR | SUBCUTANEOUS | 5 refills | Status: DC
  Filled 2023-04-02: qty 3, 28d supply, fill #0
  Filled 2023-07-20: qty 3, 28d supply, fill #1

## 2023-04-02 MED ORDER — CLONIDINE HCL 0.3 MG PO TABS
0.3000 mg | ORAL_TABLET | Freq: Every day | ORAL | 1 refills | Status: DC
  Filled 2023-04-02 – 2023-05-05 (×2): qty 90, 90d supply, fill #0
  Filled 2023-07-20: qty 90, 90d supply, fill #1

## 2023-04-02 MED ORDER — TRIAMTERENE-HCTZ 75-50 MG PO TABS
1.0000 | ORAL_TABLET | Freq: Every day | ORAL | 3 refills | Status: DC
  Filled 2023-04-02 – 2023-04-22 (×2): qty 90, 90d supply, fill #0
  Filled 2023-07-20: qty 90, 90d supply, fill #1
  Filled 2023-10-18: qty 90, 90d supply, fill #2
  Filled 2024-01-21: qty 90, 90d supply, fill #3

## 2023-04-02 MED ORDER — AMLODIPINE BESYLATE 10 MG PO TABS
10.0000 mg | ORAL_TABLET | Freq: Every day | ORAL | 1 refills | Status: DC
  Filled 2023-04-02: qty 90, 90d supply, fill #0

## 2023-04-02 NOTE — Assessment & Plan Note (Signed)
[name] is reminded of the importance of commitment to daily physical activity for 30 minutes or more, as able and the need to limit carbohydrate intake to 30 to 60 grams per meal to help with blood sugar control.   The need to take medication as prescribed, test blood sugar as directed, and to call between visits if there is a concern that blood sugar is uncontrolled is also discussed.   [name] is reminded of the importance of daily foot exam, annual eye examination, and good blood sugar, blood pressure and cholesterol control.     Latest Ref Rng & Units 03/23/2023    1:37 PM 03/23/2023   11:59 AM 01/14/2023    9:29 AM 12/16/2022   11:12 AM 08/29/2022   10:14 AM  Diabetic Labs  HbA1c 4.8 - 5.6 %  6.9   6.8  7.5   Micro/Creat Ratio 0 - 29 mg/g creat 16   25     Chol 100 - 199 mg/dL  098    119   HDL >14 mg/dL  73    77   Calc LDL 0 - 99 mg/dL  67    73   Triglycerides 0 - 149 mg/dL  77    69   Creatinine 0.57 - 1.00 mg/dL  7.82   9.56  2.13       04/02/2023    9:55 AM 04/02/2023    9:13 AM 04/02/2023    9:09 AM 01/14/2023    8:06 AM 12/18/2022   11:13 AM 12/18/2022   11:12 AM 12/18/2022   10:57 AM  BP/Weight  Systolic BP 158 138 152 120 142 140 138  Diastolic BP 90 84 88 73 82 80 85  Wt. (Lbs)   242.04 235.04   232  BMI   42.88 kg/m2 41.64 kg/m2   40.14 kg/m2      01/14/2023    8:00 AM 01/15/2022    8:40 AM  Foot/eye exam completion dates  Foot Form Completion Done Done      Controlled, no change in medication

## 2023-04-02 NOTE — Assessment & Plan Note (Signed)
Hyperlipidemia:Low fat diet discussed and encouraged.   Lipid Panel  Lab Results  Component Value Date   CHOL 155 03/23/2023   HDL 73 03/23/2023   LDLCALC 67 03/23/2023   TRIG 77 03/23/2023   CHOLHDL 2.1 03/23/2023     Controlled, no change in medication

## 2023-04-02 NOTE — Progress Notes (Signed)
Tina Wilkins     MRN: [id number]      DOB: [birth year]  Chief Complaint  Patient presents with   Obesity    10 week f/u for weight.     HPI [name] is here for follow up and re-evaluation of chronic medical conditions, medication management and review of any available recent lab and radiology data.  Preventive health is updated, specifically  Cancer screening and Immunization.   unFortunately she has been out of  2 of her antihypertensive medications and her BP is elevated as a result Denies polyuria, polydipsia, blurred vision , or hypoglycemic episodes.  ROS Denies recent fever or chills. Denies sinus pressure, nasal congestion, ear pain or sore throat. Denies chest congestion, productive cough or wheezing. Denies chest pains, palpitations and leg swelling Denies abdominal pain, nausea, vomiting,diarrhea or constipation.   Denies dysuria, frequency, hesitancy or incontinence. Back pain 2 weeks ago trying to lift a heavy object with her husband , doing better now Denies headaches, seizures, numbness, or tingling. Denies depression, anxiety or insomnia. Denies skin break down or rash.   PE  BP (!) 158/90   Pulse 68   Ht 5\' 3"  (1.6 m)   Wt 242 lb 0.6 oz (109.8 kg)   SpO2 91%   BMI 42.88 kg/m   Patient alert and oriented and in no cardiopulmonary distress.  HEENT: No facial asymmetry, EOMI,     Neck supple .  Chest: Clear to auscultation bilaterally.  CVS: S1, S2 no murmurs, no S3.Regular rate.  ABD: Soft non tender.   Ext: No edema  MS: decreased  ROM lumbar spine, adequate in shoulders, hips and knees.  Skin: Intact, no ulcerations or rash noted.  Psych: Good eye contact, normal affect. Memory intact not anxious or depressed appearing.  CNS: CN 2-12 intact, power,  normal throughout.no focal deficits noted.   Assessment & Plan  Hypertension goal BP (blood pressure) < 130/80 Elevated at visit, however she has been out of medication.  Will reevaluate  in 6 weeks DASH diet and commitment to daily physical activity for a minimum of 30 minutes discussed and encouraged, as a part of hypertension management. The importance of attaining a healthy weight is also discussed.     04/02/2023    9:55 AM 04/02/2023    9:13 AM 04/02/2023    9:09 AM 01/14/2023    8:06 AM 12/18/2022   11:13 AM 12/18/2022   11:12 AM 12/18/2022   10:57 AM  BP/Weight  Systolic BP 158 138 152 120 142 140 138  Diastolic BP 90 84 88 73 82 80 85  Wt. (Lbs)   242.04 235.04   232  BMI   42.88 kg/m2 41.64 kg/m2   40.14 kg/m2       Hyperlipidemia Hyperlipidemia:Low fat diet discussed and encouraged.   Lipid Panel  Lab Results  Component Value Date   CHOL 155 03/23/2023   HDL 73 03/23/2023   LDLCALC 67 03/23/2023   TRIG 77 03/23/2023   CHOLHDL 2.1 03/23/2023     Controlled, no change in medication   Type 2 diabetes mellitus with vascular disease ([facility]) [name] is reminded of the importance of commitment to daily physical activity for 30 minutes or more, as able and the need to limit carbohydrate intake to 30 to 60 grams per meal to help with blood sugar control.   The need to take medication as prescribed, test blood sugar as directed, and to call between visits if there is  a concern that blood sugar is uncontrolled is also discussed.   [name] is reminded of the importance of daily foot exam, annual eye examination, and good blood sugar, blood pressure and cholesterol control.     Latest Ref Rng & Units 03/23/2023    1:37 PM 03/23/2023   11:59 AM 01/14/2023    9:29 AM 12/16/2022   11:12 AM 08/29/2022   10:14 AM  Diabetic Labs  HbA1c 4.8 - 5.6 %  6.9   6.8  7.5   Micro/Creat Ratio 0 - 29 mg/g creat 16   25     Chol 100 - 199 mg/dL  161    096   HDL >04 mg/dL  73    77   Calc LDL 0 - 99 mg/dL  67    73   Triglycerides 0 - 149 mg/dL  77    69   Creatinine 0.57 - 1.00 mg/dL  5.40   9.81  1.91       04/02/2023    9:55 AM 04/02/2023    9:13 AM 04/02/2023     9:09 AM 01/14/2023    8:06 AM 12/18/2022   11:13 AM 12/18/2022   11:12 AM 12/18/2022   10:57 AM  BP/Weight  Systolic BP 158 138 152 120 142 140 138  Diastolic BP 90 84 88 73 82 80 85  Wt. (Lbs)   242.04 235.04   232  BMI   42.88 kg/m2 41.64 kg/m2   40.14 kg/m2      01/14/2023    8:00 AM 01/15/2022    8:40 AM  Foot/eye exam completion dates  Foot Form Completion Done Done      Controlled, no change in medication   Morbid obesity  Patient re-educated about  the importance of commitment to a  minimum of 150 minutes of exercise per week as able.  The importance of healthy food choices with portion control discussed, as well as eating regularly and within a 12 hour window most days. The need to choose "clean , green" food 50 to 75% of the time is discussed, as well as to make water the primary drink and set a goal of 64 ounces water daily.       04/02/2023    9:09 AM 01/14/2023    8:06 AM 12/18/2022   10:57 AM  Weight /BMI  Weight 242 lb 0.6 oz 235 lb 0.6 oz 232 lb  Height 5\' 3"  (1.6 m) 5\' 3"  (1.6 m) 5' 3.75" (1.619 m)  BMI 42.88 kg/m2 41.64 kg/m2 40.14 kg/m2    Weight re gain, needs to work on reversing this  Vitamin D deficiency Updated lab needed at/ before next visit. Will stop weekly vit D on completion of current

## 2023-04-02 NOTE — Assessment & Plan Note (Signed)
Elevated at visit, however she has been out of medication.  Will reevaluate in 6 weeks DASH diet and commitment to daily physical activity for a minimum of 30 minutes discussed and encouraged, as a part of hypertension management. The importance of attaining a healthy weight is also discussed.     04/02/2023    9:55 AM 04/02/2023    9:13 AM 04/02/2023    9:09 AM 01/14/2023    8:06 AM 12/18/2022   11:13 AM 12/18/2022   11:12 AM 12/18/2022   10:57 AM  BP/Weight  Systolic BP 158 138 152 120 142 140 138  Diastolic BP 90 84 88 73 82 80 85  Wt. (Lbs)   242.04 235.04   232  BMI   42.88 kg/m2 41.64 kg/m2   40.14 kg/m2

## 2023-04-02 NOTE — Assessment & Plan Note (Signed)
  Patient re-educated about  the importance of commitment to a  minimum of 150 minutes of exercise per week as able.  The importance of healthy food choices with portion control discussed, as well as eating regularly and within a 12 hour window most days. The need to choose "clean , green" food 50 to 75% of the time is discussed, as well as to make water the primary drink and set a goal of 64 ounces water daily.       04/02/2023    9:09 AM 01/14/2023    8:06 AM 12/18/2022   10:57 AM  Weight /BMI  Weight 242 lb 0.6 oz 235 lb 0.6 oz 232 lb  Height 5\' 3"  (1.6 m) 5\' 3"  (1.6 m) 5' 3.75" (1.619 m)  BMI 42.88 kg/m2 41.64 kg/m2 40.14 kg/m2    Weight re gain, needs to work on reversing this

## 2023-04-02 NOTE — Assessment & Plan Note (Signed)
Updated lab needed at/ before next visit. Will stop weekly vit D on completion of current

## 2023-04-02 NOTE — Patient Instructions (Addendum)
Please schedule your annual eye exam  F/U in mid June re evaluate blood pressure  It is important that you exercise regularly at least 30 minutes 5 times a week. If you develop chest pain, have severe difficulty breathing, or feel very tired, stop exercising immediately and seek medical attention   Think about what you will eat, plan ahead. Choose " clean, green, fresh or frozen" over canned, processed or packaged foods which are more sugary, salty and fatty. 70 to 75% of food eaten should be vegetables and fruit. Three meals at set times with snacks allowed between meals, but they must be fruit or vegetables. Aim to eat over a 12 hour period , example 7 am to 7 pm, and STOP after  your last meal of the day. Drink water,generally about 64 ounces per day, no other drink is as healthy. Fruit juice is best enjoyed in a healthy way, by EATING the fruit.    Thanks for choosing [facility], we consider it a privelige to serve you.

## 2023-04-04 ENCOUNTER — Other Ambulatory Visit (HOSPITAL_COMMUNITY): Payer: Self-pay

## 2023-04-16 ENCOUNTER — Other Ambulatory Visit (HOSPITAL_COMMUNITY): Payer: Self-pay

## 2023-04-16 ENCOUNTER — Other Ambulatory Visit (HOSPITAL_BASED_OUTPATIENT_CLINIC_OR_DEPARTMENT_OTHER): Payer: Self-pay

## 2023-04-16 ENCOUNTER — Other Ambulatory Visit: Payer: Self-pay

## 2023-04-22 ENCOUNTER — Other Ambulatory Visit: Payer: Self-pay

## 2023-04-22 ENCOUNTER — Other Ambulatory Visit (HOSPITAL_COMMUNITY): Payer: Self-pay

## 2023-04-22 ENCOUNTER — Other Ambulatory Visit (HOSPITAL_BASED_OUTPATIENT_CLINIC_OR_DEPARTMENT_OTHER): Payer: Self-pay

## 2023-04-22 ENCOUNTER — Telehealth: Payer: Self-pay | Admitting: Family Medicine

## 2023-04-22 DIAGNOSIS — E1159 Type 2 diabetes mellitus with other circulatory complications: Secondary | ICD-10-CM

## 2023-04-22 MED ORDER — ACCU-CHEK GUIDE W/DEVICE KIT
PACK | 0 refills | Status: AC
  Filled 2023-04-22: qty 1, 30d supply, fill #0
  Filled 2023-04-22 (×2): qty 1, 1d supply, fill #0

## 2023-04-22 MED ORDER — BLOOD GLUCOSE METER KIT
PACK | 0 refills | Status: AC
Start: 2023-04-22 — End: ?

## 2023-04-22 MED ORDER — GLUCOSE BLOOD VI STRP
ORAL_STRIP | 0 refills | Status: AC
  Filled 2023-04-22: qty 50, 50d supply, fill #0

## 2023-04-22 MED ORDER — ACCU-CHEK SOFTCLIX LANCETS MISC: 0 refills | Status: AC

## 2023-04-22 NOTE — Telephone Encounter (Signed)
Refill sent.

## 2023-04-22 NOTE — Telephone Encounter (Signed)
Patient called need refill on accu chek meter prescription [facility] to [facility] will pick up tomorrow.  Pharmacy  [facility] - [place], [place] - [address] [facility] [address] [place] [address] Phone: [phone]  Fax: [phone]

## 2023-04-23 ENCOUNTER — Other Ambulatory Visit (HOSPITAL_COMMUNITY): Payer: Self-pay

## 2023-04-24 ENCOUNTER — Other Ambulatory Visit (HOSPITAL_BASED_OUTPATIENT_CLINIC_OR_DEPARTMENT_OTHER): Payer: Self-pay

## 2023-05-05 ENCOUNTER — Other Ambulatory Visit (HOSPITAL_BASED_OUTPATIENT_CLINIC_OR_DEPARTMENT_OTHER): Payer: Self-pay

## 2023-05-05 ENCOUNTER — Other Ambulatory Visit: Payer: Self-pay

## 2023-05-13 ENCOUNTER — Ambulatory Visit: Payer: Medicare Other

## 2023-05-19 LAB — HM DIABETES EYE EXAM

## 2023-07-20 ENCOUNTER — Other Ambulatory Visit (HOSPITAL_COMMUNITY): Payer: Self-pay

## 2023-07-20 ENCOUNTER — Other Ambulatory Visit: Payer: Self-pay

## 2023-07-21 ENCOUNTER — Other Ambulatory Visit (HOSPITAL_BASED_OUTPATIENT_CLINIC_OR_DEPARTMENT_OTHER): Payer: Self-pay

## 2023-07-21 ENCOUNTER — Other Ambulatory Visit (HOSPITAL_COMMUNITY): Payer: Self-pay

## 2023-08-05 ENCOUNTER — Other Ambulatory Visit (HOSPITAL_COMMUNITY): Payer: Self-pay

## 2023-08-05 ENCOUNTER — Ambulatory Visit (INDEPENDENT_AMBULATORY_CARE_PROVIDER_SITE_OTHER): Payer: Medicare Other | Admitting: Family Medicine

## 2023-08-05 ENCOUNTER — Encounter: Payer: Self-pay | Admitting: Family Medicine

## 2023-08-05 VITALS — BP 117/76 | HR 68 | Ht 63.0 in | Wt 234.0 lb

## 2023-08-05 DIAGNOSIS — Z23 Encounter for immunization: Secondary | ICD-10-CM

## 2023-08-05 DIAGNOSIS — E782 Mixed hyperlipidemia: Secondary | ICD-10-CM | POA: Diagnosis not present

## 2023-08-05 DIAGNOSIS — E559 Vitamin D deficiency, unspecified: Secondary | ICD-10-CM | POA: Diagnosis not present

## 2023-08-05 DIAGNOSIS — Z1231 Encounter for screening mammogram for malignant neoplasm of breast: Secondary | ICD-10-CM

## 2023-08-05 DIAGNOSIS — I10 Essential (primary) hypertension: Secondary | ICD-10-CM

## 2023-08-05 DIAGNOSIS — E1159 Type 2 diabetes mellitus with other circulatory complications: Secondary | ICD-10-CM

## 2023-08-05 DIAGNOSIS — K219 Gastro-esophageal reflux disease without esophagitis: Secondary | ICD-10-CM

## 2023-08-05 MED ORDER — SEMAGLUTIDE (1 MG/DOSE) 4 MG/3ML ~~LOC~~ SOPN
1.0000 mg | PEN_INJECTOR | SUBCUTANEOUS | 5 refills | Status: DC
  Filled 2023-08-05 – 2023-10-18 (×2): qty 3, 28d supply, fill #0

## 2023-08-05 NOTE — Patient Instructions (Addendum)
F/u early February, call if you need me sooner  PLEASE schedule AWV at checkout  CBC, TSH, Vit d , chem 7 and eGFR, hBA1C today  Congrats on weight loss , keep it up  Flu vaccine today  Please get covid vaccine at ythe pharmacy  Nurse please  send for diabetic eye exam Dr Melene Muller done 05/20/2023  Please schedule mammogram due end January at checkout  It is important that you exercise regularly at least 30 minutes 5 times a week. If you develop chest pain, have severe difficulty breathing, or feel very tired, stop exercising immediately and seek medical attention

## 2023-08-06 LAB — CBC
Hematocrit: 38.5 % (ref 34.0–46.6)
Hemoglobin: 12.3 g/dL (ref 11.1–15.9)
MCH: 27.6 pg (ref 26.6–33.0)
MCHC: 31.9 g/dL (ref 31.5–35.7)
MCV: 86 fL (ref 79–97)
Platelets: 360 10*3/uL (ref 150–450)
RBC: 4.46 x10E6/uL (ref 3.77–5.28)
RDW: 13.7 % (ref 11.7–15.4)
WBC: 6.5 10*3/uL (ref 3.4–10.8)

## 2023-08-06 LAB — BMP8+EGFR
BUN/Creatinine Ratio: 17 (ref 12–28)
BUN: 20 mg/dL (ref 8–27)
CO2: 27 mmol/L (ref 20–29)
Calcium: 10.4 mg/dL — ABNORMAL HIGH (ref 8.7–10.3)
Chloride: 99 mmol/L (ref 96–106)
Creatinine, Ser: 1.18 mg/dL — ABNORMAL HIGH (ref 0.57–1.00)
Glucose: 105 mg/dL — ABNORMAL HIGH (ref 70–99)
Potassium: 3.4 mmol/L — ABNORMAL LOW (ref 3.5–5.2)
Sodium: 142 mmol/L (ref 134–144)
eGFR: 50 mL/min/{1.73_m2} — ABNORMAL LOW (ref >59)

## 2023-08-06 LAB — TSH: TSH: 1.8 u[IU]/mL (ref 0.450–4.500)

## 2023-08-06 LAB — HEMOGLOBIN A1C
Est. average glucose Bld gHb Est-mCnc: 154 mg/dL
Hgb A1c MFr Bld: 7 % — ABNORMAL HIGH (ref 4.8–5.6)

## 2023-08-06 LAB — VITAMIN D 25 HYDROXY (VIT D DEFICIENCY, FRACTURES): Vit D, 25-Hydroxy: 41.4 ng/mL (ref 30.0–100.0)

## 2023-08-10 NOTE — Assessment & Plan Note (Signed)
DASH diet and commitment to daily physical activity for a minimum of 30 minutes discussed and encouraged, as a part of hypertension management. The importance of attaining a healthy weight is also discussed.     08/05/2023    9:53 AM 04/02/2023    9:55 AM 04/02/2023    9:13 AM 04/02/2023    9:09 AM 01/14/2023    8:06 AM 12/18/2022   11:13 AM 12/18/2022   11:12 AM  BP/Weight  Systolic BP 117 158 138 152 120 142 140  Diastolic BP 76 90 84 88 73 82 80  Wt. (Lbs) 234.04   242.04 235.04    BMI 41.46 kg/m2   42.88 kg/m2 41.64 kg/m2       Controlled, no change in medication

## 2023-08-10 NOTE — Assessment & Plan Note (Signed)
Hyperlipidemia:Low fat diet discussed and encouraged.   Lipid Panel  Lab Results  Component Value Date   CHOL 155 03/23/2023   HDL 73 03/23/2023   LDLCALC 67 03/23/2023   TRIG 77 03/23/2023   CHOLHDL 2.1 03/23/2023     Controlled, no change in medication

## 2023-08-10 NOTE — Progress Notes (Signed)
Tina Wilkins     MRN: [id number]      DOB: 12-08-1955  Chief Complaint  Patient presents with   Follow-up    Follow up hip/knee pain     HPI [name] is here for follow up and re-evaluation of chronic medical conditions, medication management and review of any available recent lab and radiology data.  Preventive health is updated, specifically  Cancer screening and Immunization.   Questions or concerns regarding consultations or procedures which the PT has had in the interim are  addressed. The PT denies any adverse reactions to current medications since the last visit.  There are no new concerns.  There are no specific complaints   ROS Denies recent fever or chills. Denies sinus pressure, nasal congestion, ear pain or sore throat. Denies chest congestion, productive cough or wheezing. Denies chest pains, palpitations and leg swelling Denies abdominal pain, nausea, vomiting,diarrhea or constipation.   Denies dysuria, frequency, hesitancy or incontinence.  Denies headaches, seizures, numbness, or tingling. Denies depression, anxiety or insomnia. Denies skin break down or rash.   PE  BP 117/76 (BP Location: Right Arm, Patient Position: Sitting, Cuff Size: Large)   Pulse 68   Ht 5\' 3"  (1.6 m)   Wt 234 lb 0.6 oz (106.2 kg)   SpO2 91%   BMI 41.46 kg/m   Patient alert and oriented and in no cardiopulmonary distress.  HEENT: No facial asymmetry, EOMI,     Neck supple .  Chest: Clear to auscultation bilaterally.  CVS: S1, S2 no murmurs, no S3.Regular rate.  ABD: Soft non tender.   Ext: No edema  YQ:[id number] though  Adequate ROM spine, shoulders, hips and knees.  Skin: Intact, no ulcerations or rash noted.  Psych: Good eye contact, normal affect. Memory intact not anxious or depressed appearing.  CNS: CN 2-12 intact, power,  normal throughout.no focal deficits noted.   Assessment & Plan  Hypertension goal BP (blood pressure) < 130/80 DASH diet and  commitment to daily physical activity for a minimum of 30 minutes discussed and encouraged, as a part of hypertension management. The importance of attaining a healthy weight is also discussed.     08/05/2023    9:53 AM 04/02/2023    9:55 AM 04/02/2023    9:13 AM 04/02/2023    9:09 AM 01/14/2023    8:06 AM 12/18/2022   11:13 AM 12/18/2022   11:12 AM  BP/Weight  Systolic BP 117 158 138 152 120 142 140  Diastolic BP 76 90 84 88 73 82 80  Wt. (Lbs) 234.04   242.04 235.04    BMI 41.46 kg/m2   42.88 kg/m2 41.64 kg/m2       Controlled, no change in medication   Type 2 diabetes mellitus with vascular disease (HCC) [name] is reminded of the importance of commitment to daily physical activity for 30 minutes or more, as able and the need to limit carbohydrate intake to 30 to 60 grams per meal to help with blood sugar control.   The need to take medication as prescribed, test blood sugar as directed, and to call between visits if there is a concern that blood sugar is uncontrolled is also discussed.   [name] is reminded of the importance of daily foot exam, annual eye examination, and good blood sugar, blood pressure and cholesterol control.     Latest Ref Rng & Units 08/05/2023   10:47 AM 03/23/2023    1:37 PM 03/23/2023   11:59 AM  01/14/2023    9:29 AM 12/16/2022   11:12 AM  Diabetic Labs  HbA1c 4.8 - 5.6 % 7.0   6.9   6.8   Micro/Creat Ratio 0 - 29 mg/g creat  16   25    Chol 100 - 199 mg/dL   161     HDL >09 mg/dL   73     Calc LDL 0 - 99 mg/dL   67     Triglycerides 0 - 149 mg/dL   77     Creatinine 6.04 - 1.00 mg/dL 5.40   9.81   1.91       08/05/2023    9:53 AM 04/02/2023    9:55 AM 04/02/2023    9:13 AM 04/02/2023    9:09 AM 01/14/2023    8:06 AM 12/18/2022   11:13 AM 12/18/2022   11:12 AM  BP/Weight  Systolic BP 117 158 138 152 120 142 140  Diastolic BP 76 90 84 88 73 82 80  Wt. (Lbs) 234.04   242.04 235.04    BMI 41.46 kg/m2   42.88 kg/m2 41.64 kg/m2        Latest Ref Rng &  Units 05/19/2023   12:00 AM 01/14/2023    8:00 AM  Foot/eye exam completion dates  Eye Exam No Retinopathy No Retinopathy       Foot Form Completion   Done     This result is from an external source.      Controlled, no change in medication   Morbid obesity  Patient re-educated about  the importance of commitment to a  minimum of 150 minutes of exercise per week as able.  The importance of healthy food choices with portion control discussed, as well as eating regularly and within a 12 hour window most days. The need to choose "clean , green" food 50 to 75% of the time is discussed, as well as to make water the primary drink and set a goal of 64 ounces water daily.       08/05/2023    9:53 AM 04/02/2023    9:09 AM 01/14/2023    8:06 AM  Weight /BMI  Weight 234 lb 0.6 oz 242 lb 0.6 oz 235 lb 0.6 oz  Height 5\' 3"  (1.6 m) 5\' 3"  (1.6 m) 5\' 3"  (1.6 m)  BMI 41.46 kg/m2 42.88 kg/m2 41.64 kg/m2      Vitamin D deficiency Continue supplement  Hyperlipidemia Hyperlipidemia:Low fat diet discussed and encouraged.   Lipid Panel  Lab Results  Component Value Date   CHOL 155 03/23/2023   HDL 73 03/23/2023   LDLCALC 67 03/23/2023   TRIG 77 03/23/2023   CHOLHDL 2.1 03/23/2023     Controlled, no change in medication   Chronic GERD Controlled, no change in medication

## 2023-08-10 NOTE — Assessment & Plan Note (Signed)
Tina Wilkins is reminded of the importance of commitment to daily physical activity for 30 minutes or more, as able and the need to limit carbohydrate intake to 30 to 60 grams per meal to help with blood sugar control.   The need to take medication as prescribed, test blood sugar as directed, and to call between visits if there is a concern that blood sugar is uncontrolled is also discussed.   Tina Wilkins is reminded of the importance of daily foot exam, annual eye examination, and good blood sugar, blood pressure and cholesterol control.     Latest Ref Rng & Units 08/05/2023   10:47 AM 03/23/2023    1:37 PM 03/23/2023   11:59 AM 01/14/2023    9:29 AM 12/16/2022   11:12 AM  Diabetic Labs  HbA1c 4.8 - 5.6 % 7.0   6.9   6.8   Micro/Creat Ratio 0 - 29 mg/g creat  16   25    Chol 100 - 199 mg/dL   696     HDL >29 mg/dL   73     Calc LDL 0 - 99 mg/dL   67     Triglycerides 0 - 149 mg/dL   77     Creatinine 5.28 - 1.00 mg/dL 4.13   2.44   0.10       08/05/2023    9:53 AM 04/02/2023    9:55 AM 04/02/2023    9:13 AM 04/02/2023    9:09 AM 01/14/2023    8:06 AM 12/18/2022   11:13 AM 12/18/2022   11:12 AM  BP/Weight  Systolic BP 117 158 138 152 120 142 140  Diastolic BP 76 90 84 88 73 82 80  Wt. (Lbs) 234.04   242.04 235.04    BMI 41.46 kg/m2   42.88 kg/m2 41.64 kg/m2        Latest Ref Rng & Units 05/19/2023   12:00 AM 01/14/2023    8:00 AM  Foot/eye exam completion dates  Eye Exam No Retinopathy No Retinopathy       Foot Form Completion   Done     This result is from an external source.      Controlled, no change in medication

## 2023-08-10 NOTE — Assessment & Plan Note (Signed)
Continue supplement. 

## 2023-08-10 NOTE — Assessment & Plan Note (Signed)
  Patient re-educated about  the importance of commitment to a  minimum of 150 minutes of exercise per week as able.  The importance of healthy food choices with portion control discussed, as well as eating regularly and within a 12 hour window most days. The need to choose "clean , green" food 50 to 75% of the time is discussed, as well as to make water the primary drink and set a goal of 64 ounces water daily.       08/05/2023    9:53 AM 04/02/2023    9:09 AM 01/14/2023    8:06 AM  Weight /BMI  Weight 234 lb 0.6 oz 242 lb 0.6 oz 235 lb 0.6 oz  Height 5\' 3"  (1.6 m) 5\' 3"  (1.6 m) 5\' 3"  (1.6 m)  BMI 41.46 kg/m2 42.88 kg/m2 41.64 kg/m2

## 2023-08-10 NOTE — Assessment & Plan Note (Signed)
Controlled, no change in medication  

## 2023-08-11 ENCOUNTER — Other Ambulatory Visit: Payer: Self-pay | Admitting: Family Medicine

## 2023-08-11 ENCOUNTER — Telehealth: Payer: Self-pay | Admitting: Family Medicine

## 2023-08-11 DIAGNOSIS — E782 Mixed hyperlipidemia: Secondary | ICD-10-CM

## 2023-08-11 DIAGNOSIS — E1159 Type 2 diabetes mellitus with other circulatory complications: Secondary | ICD-10-CM

## 2023-08-11 NOTE — Telephone Encounter (Signed)
Patient is returning a call from Friday- says if she does not answer leave a message

## 2023-08-11 NOTE — Telephone Encounter (Signed)
Patient aware and voiced understanding of results

## 2023-10-18 ENCOUNTER — Other Ambulatory Visit: Payer: Self-pay | Admitting: Family Medicine

## 2023-10-19 ENCOUNTER — Other Ambulatory Visit: Payer: Self-pay

## 2023-10-19 ENCOUNTER — Other Ambulatory Visit (HOSPITAL_COMMUNITY): Payer: Self-pay

## 2023-10-19 MED ORDER — CLONIDINE HCL 0.3 MG PO TABS
0.3000 mg | ORAL_TABLET | Freq: Every day | ORAL | 1 refills | Status: DC
  Filled 2023-10-19: qty 90, 90d supply, fill #0
  Filled 2024-01-21: qty 90, 90d supply, fill #1

## 2023-10-19 MED ORDER — VITAMIN D (ERGOCALCIFEROL) 1.25 MG (50000 UNIT) PO CAPS
50000.0000 [IU] | ORAL_CAPSULE | ORAL | 5 refills | Status: AC
Start: 2023-10-19 — End: ?
  Filled 2023-10-19: qty 4, 28d supply, fill #0

## 2023-10-19 MED ORDER — PANTOPRAZOLE SODIUM 40 MG PO TBEC
40.0000 mg | DELAYED_RELEASE_TABLET | Freq: Every day | ORAL | 5 refills | Status: AC
  Filled 2023-10-19: qty 30, 30d supply, fill #0

## 2023-12-28 ENCOUNTER — Ambulatory Visit (HOSPITAL_COMMUNITY)
Admission: RE | Admit: 2023-12-28 | Discharge: 2023-12-28 | Disposition: A | Discharge status: Home / Self Care | Payer: Medicare Other | Source: Ambulatory Visit | Attending: Family Medicine | Admitting: Family Medicine

## 2023-12-28 DIAGNOSIS — Z1231 Encounter for screening mammogram for malignant neoplasm of breast: Secondary | ICD-10-CM | POA: Insufficient documentation

## 2024-01-06 ENCOUNTER — Encounter: Payer: Self-pay | Admitting: Family Medicine

## 2024-01-06 ENCOUNTER — Other Ambulatory Visit: Payer: Self-pay

## 2024-01-06 ENCOUNTER — Ambulatory Visit: Payer: Medicare Other | Admitting: Family Medicine

## 2024-01-06 ENCOUNTER — Other Ambulatory Visit (HOSPITAL_COMMUNITY): Payer: Self-pay

## 2024-01-06 VITALS — BP 155/98 | HR 67 | Ht 63.0 in | Wt 234.1 lb

## 2024-01-06 DIAGNOSIS — I10 Essential (primary) hypertension: Secondary | ICD-10-CM

## 2024-01-06 DIAGNOSIS — E1169 Type 2 diabetes mellitus with other specified complication: Secondary | ICD-10-CM

## 2024-01-06 DIAGNOSIS — E559 Vitamin D deficiency, unspecified: Secondary | ICD-10-CM

## 2024-01-06 DIAGNOSIS — E782 Mixed hyperlipidemia: Secondary | ICD-10-CM

## 2024-01-06 DIAGNOSIS — M109 Gout, unspecified: Secondary | ICD-10-CM | POA: Diagnosis not present

## 2024-01-06 MED ORDER — AMLODIPINE BESYLATE 5 MG PO TABS: 5.0000 mg | ORAL_TABLET | Freq: Every day | ORAL | 3 refills | Status: DC

## 2024-01-06 MED ORDER — SEMAGLUTIDE (1 MG/DOSE) 4 MG/3ML ~~LOC~~ SOPN
1.0000 mg | PEN_INJECTOR | SUBCUTANEOUS | 5 refills | Status: DC
  Filled 2024-01-06 – 2024-02-15 (×3): qty 3, 28d supply, fill #0
  Filled 2024-04-21: qty 3, 28d supply, fill #1

## 2024-01-06 NOTE — Patient Instructions (Addendum)
 fF/u in 6 to 8 weeks with MD , re evaluate blood pressure and chronic problems, foot exam at visit  Please schedule wellness at checkout  Blood pressure is high you need to start amlodipine  5 mg daily, I have sent this in  Nurse Please enter Moderna booster given 10/05/2023  Labs already ordered to be drawn today pls add uric acid level  It is important that you exercise regularly at least 30 minutes 5 times a week. If you develop chest pain, have severe difficulty breathing, or feel very tired, stop exercising immediately and seek medical attention    Think about what you will eat, plan ahead. Choose  clean, green, fresh or frozen over canned, processed or packaged foods which are more sugary, salty and fatty. 70 to 75% of food eaten should be vegetables and fruit. Three meals at set times with snacks allowed between meals, but they must be fruit or vegetables. Aim to eat over a 12 hour period , example 7 am to 7 pm, and STOP after  your last meal of the day. Drink water,generally about 64 ounces per day, no other drink is as healthy. Fruit juice is best enjoyed in a healthy way, by EATING the fruit.  Thanks for choosing [facility], we consider it a privelige to serve you.

## 2024-01-07 LAB — CMP14+EGFR
ALT: 29 [IU]/L (ref 0–32)
AST: 23 [IU]/L (ref 0–40)
Albumin: 4.3 g/dL (ref 3.9–4.9)
Alkaline Phosphatase: 109 [IU]/L (ref 44–121)
BUN/Creatinine Ratio: 14 (ref 12–28)
BUN: 13 mg/dL (ref 8–27)
Bilirubin Total: 0.9 mg/dL (ref 0.0–1.2)
CO2: 24 mmol/L (ref 20–29)
Calcium: 10.1 mg/dL (ref 8.7–10.3)
Chloride: 104 mmol/L (ref 96–106)
Creatinine, Ser: 0.94 mg/dL (ref 0.57–1.00)
Globulin, Total: 2.9 g/dL (ref 1.5–4.5)
Glucose: 125 mg/dL — ABNORMAL HIGH (ref 70–99)
Potassium: 3.6 mmol/L (ref 3.5–5.2)
Sodium: 144 mmol/L (ref 134–144)
Total Protein: 7.2 g/dL (ref 6.0–8.5)
eGFR: 66 mL/min/{1.73_m2} (ref >59)

## 2024-01-07 LAB — HEMOGLOBIN A1C
Est. average glucose Bld gHb Est-mCnc: 154 mg/dL
Hgb A1c MFr Bld: 7 % — ABNORMAL HIGH (ref 4.8–5.6)

## 2024-01-07 LAB — LIPID PANEL
Chol/HDL Ratio: 2.1 {ratio} (ref 0.0–4.4)
Cholesterol, Total: 165 mg/dL (ref 100–199)
HDL: 78 mg/dL (ref >39)
LDL Chol Calc (NIH): 71 mg/dL (ref 0–99)
Triglycerides: 89 mg/dL (ref 0–149)
VLDL Cholesterol Cal: 16 mg/dL (ref 5–40)

## 2024-01-07 LAB — URIC ACID: Uric Acid: 7.7 mg/dL — ABNORMAL HIGH (ref 3.0–7.2)

## 2024-01-11 ENCOUNTER — Other Ambulatory Visit: Payer: Self-pay

## 2024-01-11 DIAGNOSIS — E559 Vitamin D deficiency, unspecified: Secondary | ICD-10-CM

## 2024-01-11 DIAGNOSIS — M109 Gout, unspecified: Secondary | ICD-10-CM | POA: Insufficient documentation

## 2024-01-11 DIAGNOSIS — R7989 Other specified abnormal findings of blood chemistry: Secondary | ICD-10-CM

## 2024-01-11 DIAGNOSIS — E1159 Type 2 diabetes mellitus with other circulatory complications: Secondary | ICD-10-CM

## 2024-01-11 DIAGNOSIS — I10 Essential (primary) hypertension: Secondary | ICD-10-CM

## 2024-01-11 NOTE — Assessment & Plan Note (Signed)
 Diabetes associated with hypertension, hyperlipidemia, and obesity  Tina Wilkins is reminded of the importance of commitment to daily physical activity for 30 minutes or more, as able and the need to limit carbohydrate intake to 30 to 60 grams per meal to help with blood sugar control.   The need to take medication as prescribed, test blood sugar as directed, and to call between visits if there is a concern that blood sugar is uncontrolled is also discussed.   Tina Wilkins is reminded of the importance of daily foot exam, annual eye examination, and good blood sugar, blood pressure and cholesterol control.     Latest Ref Rng & Units 01/06/2024   10:46 AM 08/05/2023   10:47 AM 03/23/2023    1:37 PM 03/23/2023   11:59 AM 01/14/2023    9:29 AM  Diabetic Labs  HbA1c 4.8 - 5.6 % 7.0  7.0   6.9    Micro/Creat Ratio 0 - 29 mg/g creat   16   25   Chol 100 - 199 mg/dL 161    096    HDL >04 mg/dL 78    73    Calc LDL 0 - 99 mg/dL 71    67    Triglycerides 0 - 149 mg/dL 89    77    Creatinine 0.57 - 1.00 mg/dL 5.40  9.81   1.91        01/06/2024   10:26 AM 01/06/2024   10:25 AM 01/06/2024   10:01 AM 01/06/2024    9:59 AM 08/05/2023    9:53 AM 04/02/2023    9:55 AM 04/02/2023    9:13 AM  BP/Weight  Systolic BP 155 150 129 144 117 158 138  Diastolic BP 98 100 80 82 76 90 84  Wt. (Lbs)    234.12 234.04    BMI    41.47 kg/m2 41.46 kg/m2        Latest Ref Rng & Units 05/19/2023   12:00 AM 01/14/2023    8:00 AM  Foot/eye exam completion dates  Eye Exam No Retinopathy No Retinopathy       Foot Form Completion   Done     This result is from an external source.

## 2024-01-11 NOTE — Assessment & Plan Note (Signed)
 Updated lab needed at/ before next visit.

## 2024-01-11 NOTE — Assessment & Plan Note (Signed)
 Hyperlipidemia:Low fat diet discussed and encouraged.   Lipid Panel  Lab Results  Component Value Date   CHOL 165 01/06/2024   HDL 78 01/06/2024   LDLCALC 71 01/06/2024   TRIG 89 01/06/2024   CHOLHDL 2.1 01/06/2024     Controlled, no change in medication

## 2024-01-11 NOTE — Assessment & Plan Note (Addendum)
 Great toe apin with  no rauma, check uric axcid level symptoms have improved spontaneously Will not be started on med though  uric acid level is slightly high, dietary advice/ modification discussed

## 2024-01-11 NOTE — Assessment & Plan Note (Signed)
  Patient re-educated about  the importance of commitment to a  minimum of 150 minutes of exercise per week as able.  The importance of healthy food choices with portion control discussed, as well as eating regularly and within a 12 hour window most days. The need to choose "clean , green" food 50 to 75% of the time is discussed, as well as to make water the primary drink and set a goal of 64 ounces water daily.       01/06/2024    9:59 AM 08/05/2023    9:53 AM 04/02/2023    9:09 AM  Weight /BMI  Weight 234 lb 1.9 oz 234 lb 0.6 oz 242 lb 0.6 oz  Height 5\' 3"  (1.6 m) 5\' 3"  (1.6 m) 5\' 3"  (1.6 m)  BMI 41.47 kg/m2 41.46 kg/m2 42.88 kg/m2    Unchnaged needs to be more consitent wit food choice in particular

## 2024-01-11 NOTE — Assessment & Plan Note (Signed)
 DASH diet and commitment to daily physical activity for a minimum of 30 minutes discussed and encouraged, as a part of hypertension management. The importance of attaining a healthy weight is also discussed.     01/06/2024   10:26 AM 01/06/2024   10:25 AM 01/06/2024   10:01 AM 01/06/2024    9:59 AM 08/05/2023    9:53 AM 04/02/2023    9:55 AM 04/02/2023    9:13 AM  BP/Weight  Systolic BP 155 150 129 144 117 158 138  Diastolic BP 98 100 80 82 76 90 84  Wt. (Lbs)    234.12 234.04    BMI    41.47 kg/m2 41.46 kg/m2       Uncontrolled, amlodipine  5 mg prescribed with 6 week follow up

## 2024-01-11 NOTE — Progress Notes (Signed)
 Tina Wilkins     MRN: [id number]      DOB: 10/21/1956  Chief Complaint  Patient presents with   Follow-up    Follow up L foot possible gout or arthritis, sinus issues    HPI [name] is here for follow up and re-evaluation of chronic medical conditions, medication management and review of any available recent lab and radiology data.  Preventive health is updated, specifically  Cancer screening and Immunization.   Above concerns as above. Question if gout is cause of symptoms Denies polyuria, polydipsia, blurred vision , or hypoglycemic episodes.  ROS Denies recent fever or chills. Denies sinus pressure, nasal congestion, ear pain or sore throat. Denies chest congestion, productive cough or wheezing. Denies chest pains, palpitations and leg swelling Denies abdominal pain, nausea, vomiting,diarrhea or constipation.   Denies dysuria, frequency, hesitancy or incontinence.  Denies depression, anxiety or insomnia. Denies skin break down or rash.   PE  BP (!) 155/98   Pulse 67   Ht 5' 3 (1.6 m)   Wt 234 lb 1.9 oz (106.2 kg)   SpO2 98%   BMI 41.47 kg/m   Patient alert and oriented and in no cardiopulmonary distress.  HEENT: No facial asymmetry, EOMI,     Neck supple .  Chest: Clear to auscultation bilaterally.  CVS: S1, S2 no murmurs, no S3.Regular rate.  ABD: Soft non tender.   Ext: No edema  MS: Adequate ROM spine, shoulders, hips and knees.  Skin: Intact, no ulcerations or rash noted.  Psych: Good eye contact, normal affect. Memory intact not anxious or depressed appearing.  CNS: CN 2-12 intact, power,  normal throughout.no focal deficits noted.   Assessment & Plan  Hypertension goal BP (blood pressure) < 130/80 DASH diet and commitment to daily physical activity for a minimum of 30 minutes discussed and encouraged, as a part of hypertension management. The importance of attaining a healthy weight is also discussed.     01/06/2024   10:26 AM 01/06/2024    10:25 AM 01/06/2024   10:01 AM 01/06/2024    9:59 AM 08/05/2023    9:53 AM 04/02/2023    9:55 AM 04/02/2023    9:13 AM  BP/Weight  Systolic BP 155 150 129 144 117 158 138  Diastolic BP 98 100 80 82 76 90 84  Wt. (Lbs)    234.12 234.04    BMI    41.47 kg/m2 41.46 kg/m2       Uncontrolled, amlodipine  5 mg prescribed with 6 week follow up  Type 2 diabetes mellitus with other specified complication (HCC) Diabetes associated with hypertension, hyperlipidemia, and obesity  [name] is reminded of the importance of commitment to daily physical activity for 30 minutes or more, as able and the need to limit carbohydrate intake to 30 to 60 grams per meal to help with blood sugar control.   The need to take medication as prescribed, test blood sugar as directed, and to call between visits if there is a concern that blood sugar is uncontrolled is also discussed.   [name] is reminded of the importance of daily foot exam, annual eye examination, and good blood sugar, blood pressure and cholesterol control.     Latest Ref Rng & Units 01/06/2024   10:46 AM 08/05/2023   10:47 AM 03/23/2023    1:37 PM 03/23/2023   11:59 AM 01/14/2023    9:29 AM  Diabetic Labs  HbA1c 4.8 - 5.6 % 7.0  7.0  6.9    Micro/Creat Ratio 0 - 29 mg/g creat   16   25   Chol 100 - 199 mg/dL 834    844    HDL >60 mg/dL 78    73    Calc LDL 0 - 99 mg/dL 71    67    Triglycerides 0 - 149 mg/dL 89    77    Creatinine 0.57 - 1.00 mg/dL 9.05  8.81   8.95        01/06/2024   10:26 AM 01/06/2024   10:25 AM 01/06/2024   10:01 AM 01/06/2024    9:59 AM 08/05/2023    9:53 AM 04/02/2023    9:55 AM 04/02/2023    9:13 AM  BP/Weight  Systolic BP 155 150 129 144 117 158 138  Diastolic BP 98 100 80 82 76 90 84  Wt. (Lbs)    234.12 234.04    BMI    41.47 kg/m2 41.46 kg/m2        Latest Ref Rng & Units 05/19/2023   12:00 AM 01/14/2023    8:00 AM  Foot/eye exam completion dates  Eye Exam No Retinopathy No Retinopathy       Foot Form Completion    Done     This result is from an external source.        Morbid obesity  Patient re-educated about  the importance of commitment to a  minimum of 150 minutes of exercise per week as able.  The importance of healthy food choices with portion control discussed, as well as eating regularly and within a 12 hour window most days. The need to choose clean , green food 50 to 75% of the time is discussed, as well as to make water the primary drink and set a goal of 64 ounces water daily.       01/06/2024    9:59 AM 08/05/2023    9:53 AM 04/02/2023    9:09 AM  Weight /BMI  Weight 234 lb 1.9 oz 234 lb 0.6 oz 242 lb 0.6 oz  Height 5' 3 (1.6 m) 5' 3 (1.6 m) 5' 3 (1.6 m)  BMI 41.47 kg/m2 41.46 kg/m2 42.88 kg/m2    Unchnaged needs to be more consitent wit food choice in particular  Hyperlipidemia Hyperlipidemia:Low fat diet discussed and encouraged.   Lipid Panel  Lab Results  Component Value Date   CHOL 165 01/06/2024   HDL 78 01/06/2024   LDLCALC 71 01/06/2024   TRIG 89 01/06/2024   CHOLHDL 2.1 01/06/2024     Controlled, no change in medication   Vitamin D  deficiency Updated lab needed at/ before next visit.   Gout Great toe apin with  no rauma, check uric axcid level symptoms have improved spontaneously Will not be started on med though  uric acid level is slightly high, dietary advice/ modification discussed

## 2024-01-11 NOTE — Progress Notes (Signed)
 FYI: patient wanted you to know that her swelling in legs and feet has gone done  Patient advised of lab results w/ providers recommendations. She is in agreement with treatment plan. Labs ordered to be drawn 3-5 days before next appt. Patient is aware.

## 2024-01-20 ENCOUNTER — Other Ambulatory Visit: Payer: Self-pay

## 2024-01-20 ENCOUNTER — Other Ambulatory Visit (HOSPITAL_COMMUNITY): Payer: Self-pay

## 2024-01-21 ENCOUNTER — Other Ambulatory Visit: Payer: Self-pay

## 2024-01-21 ENCOUNTER — Other Ambulatory Visit: Payer: Self-pay | Admitting: Family Medicine

## 2024-01-21 ENCOUNTER — Other Ambulatory Visit (HOSPITAL_COMMUNITY): Payer: Self-pay

## 2024-01-21 MED ORDER — BIMATOPROST 0.03 % OP SOLN
1.0000 [drp] | Freq: Every day | OPHTHALMIC | 4 refills | Status: AC
  Filled 2024-01-21: qty 2.5, 25d supply, fill #0

## 2024-01-21 MED ORDER — ROSUVASTATIN CALCIUM 40 MG PO TABS
40.0000 mg | ORAL_TABLET | Freq: Every day | ORAL | 2 refills | Status: DC
  Filled 2024-01-21: qty 90, 90d supply, fill #0
  Filled 2024-04-21: qty 90, 90d supply, fill #1

## 2024-01-22 ENCOUNTER — Other Ambulatory Visit: Payer: Self-pay

## 2024-01-22 ENCOUNTER — Other Ambulatory Visit (HOSPITAL_COMMUNITY): Payer: Self-pay

## 2024-01-22 MED ORDER — LUMIGAN 0.01 % OP SOLN
OPHTHALMIC | 3 refills | Status: AC
  Filled 2024-01-22: qty 7.5, 75d supply, fill #0

## 2024-01-22 MED ORDER — CEQUA 0.09 % OP SOLN
OPHTHALMIC | 6 refills | Status: AC
  Filled 2024-01-22 – 2024-02-18 (×2): qty 60, 30d supply, fill #0

## 2024-01-25 ENCOUNTER — Other Ambulatory Visit: Payer: Self-pay

## 2024-02-04 ENCOUNTER — Other Ambulatory Visit (HOSPITAL_COMMUNITY): Payer: Self-pay

## 2024-02-15 ENCOUNTER — Other Ambulatory Visit (HOSPITAL_COMMUNITY): Payer: Self-pay

## 2024-02-15 ENCOUNTER — Other Ambulatory Visit: Payer: Self-pay

## 2024-02-15 NOTE — Telephone Encounter (Unsigned)
 Copied from CRM [id number]. Topic: Clinical - Prescription Issue >> Feb 15, 2024  1:57 PM Gildardo Pounds wrote: Reason for CRM: Patient states that pre-authorization for Semaglutide, 1 MG/DOSE, 4 MG/3ML SOPN is needed. Callback number is [phone]

## 2024-02-16 ENCOUNTER — Telehealth: Payer: Self-pay | Admitting: Pharmacy Technician

## 2024-02-16 ENCOUNTER — Other Ambulatory Visit (HOSPITAL_COMMUNITY): Payer: Self-pay

## 2024-02-16 NOTE — Telephone Encounter (Signed)
 Pharmacy Patient Advocate Encounter   Received notification from CoverMyMeds that prior authorization for Ozempic (1 MG/DOSE) 4MG /3ML pen-injectors is required/requested.   Insurance verification completed.   The patient is insured through [facility] .   Per test claim: PA required; PA submitted to above mentioned insurance via CoverMyMeds Key/confirmation #/EOC [id number] Status is pending

## 2024-02-17 ENCOUNTER — Other Ambulatory Visit (HOSPITAL_COMMUNITY): Payer: Self-pay

## 2024-02-17 ENCOUNTER — Other Ambulatory Visit: Payer: Self-pay

## 2024-02-17 NOTE — Telephone Encounter (Signed)
 Pharmacy Patient Advocate Encounter  Received notification from [facility] that Prior Authorization for [facility] 1MG /DOSE has been APPROVED from 02/16/2024 to 11/30/2024. Ran test claim, Copay is $4.00. This test claim was processed through [facility]- copay amounts may vary at other pharmacies due to pharmacy/plan contracts, or as the patient moves through the different stages of their insurance plan.   PA #/Case ID/Reference #: [id number]

## 2024-02-18 ENCOUNTER — Other Ambulatory Visit (HOSPITAL_COMMUNITY): Payer: Self-pay

## 2024-02-24 ENCOUNTER — Ambulatory Visit: Payer: Medicare Other | Admitting: Family Medicine

## 2024-02-25 ENCOUNTER — Ambulatory Visit

## 2024-03-22 ENCOUNTER — Ambulatory Visit (INDEPENDENT_AMBULATORY_CARE_PROVIDER_SITE_OTHER): Payer: Self-pay

## 2024-03-22 VITALS — BP 138/87 | Ht 63.25 in | Wt 228.0 lb

## 2024-03-22 DIAGNOSIS — Z0000 Encounter for general adult medical examination without abnormal findings: Secondary | ICD-10-CM

## 2024-03-22 DIAGNOSIS — Z780 Asymptomatic menopausal state: Secondary | ICD-10-CM

## 2024-03-22 DIAGNOSIS — E1159 Type 2 diabetes mellitus with other circulatory complications: Secondary | ICD-10-CM

## 2024-03-22 DIAGNOSIS — E1169 Type 2 diabetes mellitus with other specified complication: Secondary | ICD-10-CM

## 2024-03-22 NOTE — Patient Instructions (Addendum)
 Tina Wilkins , Thank you for taking time to come for your Medicare Wellness Visit. I appreciate your ongoing commitment to your health goals. Please review the following plan we discussed and let me know if I can assist you in the future.   Referrals/Orders/Follow-Ups/Clinician Recommendations:  Next Medicare AWV: March 27, 2025 at 10:00 am telephone visit.   Please have the following labs drawn at [employer] at [facility]. You do not have to schedule an appointment for this.  Diabetic Urine Screen  Below is some home safety adaptation resources. Please reach out to them regarding your bathtub.   Resource Website Printmaker [email or web address]-[email or web address]/Wheelchair-Ramp-Construction  Wheelchair Ramp and Guard Rails  [place]  [employer] [facility] ([place]) https://www.ball.org/  [place] for Shower  [employer]umbing http://www.gogreenplumb.com/Contact-Us   [place], [place], or [place], [place] - plumbing assistance for those who cannot afford it. They assist 1 household per month. Nominate at website link.   [employer] RentalMaids.dk  safety repairs, grab bars  [facility] [place] and [employer] [facility] [phone]  Home modifications so people can remain at home.  $8000 cap   An order for a mammogram and/or a bone density scan was placed for you today. Please call the number below to schedule your appt.   [facility] at [facility] [address] [place], [place] [address] [phone] Make sure to wear two-piece clothing.  If you're having a mammogram, please remember: No lotions,powders, or deodorants the day of the appointment Make sure to bring picture ID and insurance card.  Bring list of medications you are currently taking including any supplements.  If you are having a bone density scan, please  discontinue any medications that contain calcium  at least 48 hours (2 days) prior to your scan.      This is a list of the screening recommended for you and due dates:  Health Maintenance  Topic Date Due   COVID-19 Vaccine (8 - 2024-25 season) 11/30/2023   Complete foot exam   01/15/2024   Medicare Annual Wellness Visit  01/15/2024   Yearly kidney health urinalysis for diabetes  03/22/2024   Eye exam for diabetics  05/18/2024   Flu Shot  07/01/2024   Hemoglobin A1C  07/05/2024   Yearly kidney function blood test for diabetes  01/05/2025   Colon Cancer Screening  08/01/2025   Mammogram  12/27/2025   DTaP/Tdap/Td vaccine (3 - Td or Tdap) 02/03/2033   Pneumonia Vaccine  Completed   DEXA scan (bone density measurement)  Completed   Hepatitis C Screening  Completed   Zoster (Shingles) Vaccine  Completed   HPV Vaccine  Aged Out   Meningitis B Vaccine  Aged Out    Advanced directives: (Declined) Advance directive discussed with you today. Even though you declined this today, please call our office should you change your mind, and we can give you the proper paperwork for you to fill out. Advance Care Planning is important because it:  [x]  Makes sure you receive the medical care that is consistent with your values, goals, and preferences  [x]  It provides guidance to your family and loved ones and it also reduces their decisional burden about whether or not they are making the right decisions based on what you want done  Follow the link provided in your after visit summary or read over the paperwork we have mailed to you to help you started getting your Advance Directives in place.  If you need assistance in completing these, please reach out to us  so that we can help you!   Next Medicare Annual Wellness Visit scheduled for next year: yes  Understanding Your Risk for Falls Millions of people have serious injuries from falls each year. It is important to understand your risk of falling.  Talk with your health care provider about your risk and what you can do to lower it. If you do have a serious fall, make sure to tell your provider. Falling once raises your risk of falling again. How can falls affect me? Serious injuries from falls are common. These include: Broken bones, such as hip fractures. Head injuries, such as traumatic brain injuries (TBI) or concussions. A fear of falling can cause you to avoid activities and stay at home. This can make your muscles weaker and raise your risk for a fall. What can increase my risk? There are a number of risk factors that increase your risk for falling. The more risk factors you have, the higher your risk of falling. Serious injuries from a fall happen most often to people who are older than 70 years old. Teenagers and young adults ages 34-29 are also at higher risk. Common risk factors include: Weakness in the lower body. Being generally weak or confused due to long-term (chronic) illness. Dizziness or balance problems. Poor vision. Medicines that cause dizziness or drowsiness. These may include: Medicines for your blood pressure, heart, anxiety, insomnia, or swelling (edema). Pain medicines. Muscle relaxants. Other risk factors include: Drinking alcohol . Having had a fall in the past. Having foot pain or wearing improper footwear. Working at a dangerous job. Having any of the following in your home: Tripping hazards, such as floor clutter or loose rugs. Poor lighting. Pets. Having dementia or memory loss. What actions can I take to lower my risk of falling?     Physical activity Stay physically fit. Do strength and balance exercises. Consider taking a regular class to build strength and balance. Yoga and tai chi are good options. Vision Have your eyes checked every year and your prescription for glasses or contacts updated as needed. Shoes and walking aids Wear non-skid shoes. Wear shoes that have rubber soles and low  heels. Do not wear high heels. Do not walk around the house in socks or slippers. Use a cane or walker as told by your provider. Home safety Attach secure railings on both sides of your stairs. Install grab bars for your bathtub, shower, and toilet. Use a non-skid mat in your bathtub or shower. Attach bath mats securely with double-sided, non-slip rug tape. Use good lighting in all rooms. Keep a flashlight near your bed. Make sure there is a clear path from your bed to the bathroom. Use night-lights. Do not use throw rugs. Make sure all carpeting is taped or tacked down securely. Remove all clutter from walkways and stairways, including extension cords. Repair uneven or broken steps and floors. Avoid walking on icy or slippery surfaces. Walk on the grass instead of on icy or slick sidewalks. Use ice melter to get rid of ice on walkways in the winter. Use a cordless phone. Questions to ask your health care provider Can you help me check my risk for a fall? Do any of my medicines make me more likely to fall? Should I take a vitamin D  supplement? What exercises can I do to improve my strength and balance? Should I make an appointment to have my vision checked? Do I need a  bone density test to check for weak bones (osteoporosis)? Would it help to use a cane or a walker? Where to find more information Centers for Disease Control and Prevention, STEADI: TonerPromos.no Community-Based Fall Prevention Programs: TonerPromos.no [employer] on Aging: BaseRingTones.pl Contact a health care provider if: You fall at home. You are afraid of falling at home. You feel weak, drowsy, or dizzy. This information is not intended to replace advice given to you by your health care provider. Make sure you discuss any questions you have with your health care provider. Document Revised: 07/21/2022 Document Reviewed: 07/21/2022 Elsevier Patient Education  2024 [employer].

## 2024-03-22 NOTE — Progress Notes (Addendum)
 Please attest and cosign this visit due to patients primary care provider not being in the office at the time the visit was completed.  Because this visit was a virtual/telehealth visit,  certain criteria was not obtained, such a blood pressure, CBG if applicable, and timed get up and go. Any medications not marked as "taking" were not mentioned during the medication reconciliation part of the visit. Any vitals not documented were not able to be obtained due to this being a telehealth visit or patient was unable to self-report a recent blood pressure reading due to a lack of equipment at home via telehealth. Vitals that have been documented are verbally provided by the patient.   Subjective:   Tina Wilkins is a 68 y.o. who presents for a Medicare Wellness preventive visit.  Visit Complete: Virtual I connected with  Shery Done on 03/22/24 by a audio enabled telemedicine application and verified that I am speaking with the correct person using two identifiers.  Patient Location: Home  Provider Location: Home Office  I discussed the limitations of evaluation and management by telemedicine. The patient expressed understanding and agreed to proceed.  Vital Signs: Because this visit was a virtual/telehealth visit, some criteria may be missing or patient reported. Any vitals not documented were not able to be obtained and vitals that have been documented are patient reported.  VideoDeclined- This patient declined Librarian, academic. Therefore the visit was completed with audio only.  Persons Participating in Visit: Patient.  AWV Questionnaire: No: Patient Medicare AWV questionnaire was not completed prior to this visit.  Cardiac Risk Factors include: advanced age (>56men, >47 women);hypertension;diabetes mellitus;dyslipidemia;obesity (BMI >30kg/m2);sedentary lifestyle;Other (see comment), Risk factor comments: PSVT     Objective:    Today's Vitals    03/22/24 1007  BP: 138/87  Weight: 228 lb (103.4 kg)  Height: 5' 3.25" (1.607 m)   Body mass index is 40.07 kg/m.     03/22/2024   10:13 AM 09/08/2022    2:12 PM 09/04/2021    2:28 PM 08/31/2020    8:18 AM 08/31/2019    1:35 PM 04/01/2017   10:45 AM 07/25/2015   10:01 AM  Advanced Directives  Does Patient Have a Medical Advance Directive? No Yes Yes Yes No Yes No;Yes  Type of Advance Directive   Living will Healthcare Power of [place];Living will  Healthcare Power of [place];Living will   Does patient want to make changes to medical advance directive?  Yes (MAU/Ambulatory/Procedural Areas - Information given)  Yes (MAU/Ambulatory/Procedural Areas - Information given)  Yes (MAU/Ambulatory/Procedural Areas - Information given) No - Patient declined  Copy of Healthcare Power of Attorney in Chart?    No - copy requested  No - copy requested No - copy requested  Would patient like information on creating a medical advance directive? No - Patient declined  No - Patient declined    No - patient declined information    Current Medications (verified) Outpatient Encounter Medications as of 03/22/2024  Medication Sig   Accu-Chek FastClix Lancets MISC USE AS DIRECTED TO CHECK BLOOD SUGAR DAILY   ACCU-CHEK GUIDE test strip USE AS DIRECTED TO CHECK BLOOD SUGAR DAILY   Accu-Chek Softclix Lancets lancets Use to check blood sugar once daily   acetaminophen (TYLENOL) 500 MG tablet Take 500 mg by mouth every 6 (six) hours as needed.   Alcohol  Swabs (ALCOHOL  PREPS) PADS Use as directed when testing blood glucose   aspirin  EC 81 MG tablet Take 81 mg  by mouth daily.   bimatoprost  (LUMIGAN ) 0.01 % SOLN Instill 1 drop into both eyes every evening as directed..   bimatoprost  (LUMIGAN ) 0.03 % ophthalmic solution Place 1 drop into both eyes at bedtime.   blood glucose meter kit and supplies Accuchek meter (per patient request) once daily testing  DX e11.9   Blood Glucose Monitoring Suppl (ACCU-CHEK GUIDE)  w/Device KIT Use as  directed   calcium -vitamin D  (OSCAL WITH D) 500-200 MG-UNIT per tablet Take 1 tablet by mouth 2 (two) times daily.   carboxymethylcellulose 1 % ophthalmic solution Apply 1 drop to eye 3 (three) times daily.   cloNIDine  (CATAPRES ) 0.3 MG tablet Take 1 tablet (0.3 mg total) by mouth daily.   cycloSPORINE , PF, (CEQUA ) 0.09 % SOLN Instill 1 drop into both eyes twice a day as directed.   glucose blood test strip Test blood sugar once daily   loratadine (CLARITIN) 10 MG tablet Take 10 mg by mouth daily.   metoprolol  succinate (TOPROL -XL) 25 MG 24 hr tablet Take 1 tablet (25 mg total) by mouth daily.   pantoprazole  (PROTONIX ) 40 MG tablet Take 1 tablet (40 mg total) by mouth daily.   rosuvastatin  (CRESTOR ) 40 MG tablet Take 1 tablet (40 mg total) by mouth daily.   Semaglutide , 1 MG/DOSE, 4 MG/3ML SOPN Inject 1 mg as directed once a week.   sodium chloride (OCEAN) 0.65 % SOLN nasal spray Place 1 spray into both nostrils as needed for congestion.   triamterene -hydrochlorothiazide  (MAXZIDE ) 75-50 MG tablet Take 1 tablet by mouth daily.   Vitamin D , Ergocalciferol , (DRISDOL ) 1.25 MG (50000 UNIT) CAPS capsule Take 1 capsule (50,000 Units total) by mouth once a week.   amLODipine  (NORVASC ) 5 MG tablet Take 1 tablet (5 mg total) by mouth daily. (Patient not taking: Reported on 03/22/2024)   RESTASIS  0.05 % ophthalmic emulsion  (Patient not taking: Reported on 03/22/2024)   No facility-administered encounter medications on file as of 03/22/2024.    Allergies (verified) Peanut-containing drug products, Celecoxib, Metaxalone, Metformin  and related, Nolamine [chlorphen-phenind-phenylprop], Ozempic  (0.25 or 0.5 mg-dose) [semaglutide (0.25 or 0.5mg -dos)], Sitagliptin phos-metformin  hcl, and Skelaxin   History: Past Medical History:  Diagnosis Date   Essential hypertension 12/02/2003   Hyperlipidemia 12/02/2003   Hypertension    Obesity    PSVT (paroxysmal supraventricular tachycardia)  (HCC)    Seasonal allergies    Type 2 diabetes mellitus (HCC) 12/01/2008   Past Surgical History:  Procedure Laterality Date   ABDOMINAL HYSTERECTOMY  2004   Secondary to fibroids, single ovary left   CESAREAN SECTION  1989   2 vaginal deliveries before   SHOULDER SURGERY Left    Family History  Problem Relation Age of Onset   Heart disease Mother    Stroke Mother    Heart disease Father    Stroke Father    Diabetes Father    Diabetes Sister    Heart disease Sister    Lymphoma Sister    Cancer Brother    Heart disease Brother    Cancer Brother        unknown type   Lung disease Brother    Heart disease Brother    Liver cancer Brother    Lung cancer Brother    Liver cancer Brother    Lung cancer Brother    Seizures Son    Social History   Socioeconomic History   Marital status: Married    Spouse name: Ressie Cassette   Number of children: 3   Years of education:  Not on file   Highest education level: Not on file  Occupational History   Not on file  Tobacco Use   Smoking status: Never   Smokeless tobacco: Never  Vaping Use   Vaping status: Never Used  Substance and Sexual Activity   Alcohol  use: No    Alcohol /week: 0.0 standard drinks of alcohol    Drug use: No   Sexual activity: Yes    Birth control/protection: Surgical  Other Topics Concern   Not on file  Social History Narrative   2 children.    3 grandchildren.   Social Drivers of Corporate investment banker Strain: Low Risk  (03/22/2024)   Overall Financial Resource Strain (CARDIA)    Difficulty of Paying Living Expenses: Not hard at all  Food Insecurity: No Food Insecurity (03/22/2024)   Hunger Vital Sign    Worried About Running Out of Food in the Last Year: Never true    Ran Out of Food in the Last Year: Never true  Transportation Needs: No Transportation Needs (03/22/2024)   PRAPARE - Administrator, Civil Service (Medical): No    Lack of Transportation (Non-Medical): No  Physical  Activity: Insufficiently Active (03/22/2024)   Exercise Vital Sign    Days of Exercise per Week: 2 days    Minutes of Exercise per Session: 30 min  Stress: No Stress Concern Present (03/22/2024)   [employer] of Occupational Health - Occupational Stress Questionnaire    Feeling of Stress : Not at all  Social Connections: Socially Integrated (03/22/2024)   Social Connection and Isolation Panel [NHANES]    Frequency of Communication with Friends and Family: More than three times a week    Frequency of Social Gatherings with Friends and Family: More than three times a week    Attends Religious Services: More than 4 times per year    Active Member of [employer] or Organizations: Yes    Attends Engineer, structural: More than 4 times per year    Marital Status: Married    Tobacco Counseling Counseling given: Yes    Clinical Intake:  Pre-visit preparation completed: Yes  Pain : No/denies pain     BMI - recorded: 40.07 Nutritional Status: BMI > 30  Obese Nutritional Risks: None Diabetes: Yes CBG done?: No (telehealth visit. unable to obtain cbg) Did pt. bring in CBG monitor from home?: No  Lab Results  Component Value Date   HGBA1C 7.0 (H) 01/06/2024   HGBA1C 7.0 (H) 08/05/2023   HGBA1C 6.9 (H) 03/23/2023     How often do you need to have someone help you when you read instructions, pamphlets, or other written materials from your doctor or pharmacy?: 1 - Never  Interpreter Needed?: No  Information entered by :: Sally Crazier CMA   Activities of Daily Living     03/22/2024   10:13 AM  In your present state of health, do you have any difficulty performing the following activities:  Hearing? 0  Vision? 0  Difficulty concentrating or making decisions? 0  Walking or climbing stairs? 0  Dressing or bathing? 0  Doing errands, shopping? 0  Preparing Food and eating ? N  Using the Toilet? N  In the past six months, have you accidently leaked urine? N  Do you have  problems with loss of bowel control? N  Managing your Medications? N  Managing your Finances? N  Housekeeping or managing your Housekeeping? N    Patient Care Team: Towanda Fret,  MD as PCP - General Gerard Knight, MD as PCP - Cardiology (Cardiology) Alvis Jourdain, MD as Consulting Physician (Gastroenterology) Ben Bracken, MD as Consulting Physician (Ophthalmology) Darrin Emerald, MD as Consulting Physician (Orthopedic Surgery)  Indicate any recent Medical Services you may have received from other than [facility] providers in the past year (date may be approximate).     Assessment:   This is a routine wellness examination for Tina Wilkins.  Hearing/Vision screen Hearing Screening - Comments:: Patient denies any hearing difficulties.    Vision Screening - Comments:: Wears rx glasses - up to date with routine eye exams  Patient sees Ben Bracken in [place]    Goals Addressed             This Visit's Progress    Patient Stated       Go on a vacation       Depression Screen     03/22/2024   10:14 AM 01/06/2024   10:01 AM 08/05/2023    9:55 AM 04/02/2023    9:10 AM 01/14/2023    8:07 AM 12/18/2022   11:00 AM 09/08/2022    2:18 PM  PHQ 2/9 Scores  PHQ - 2 Score 0 0 0 0 0 0 0  PHQ- 9 Score 0   1 0 1     Fall Risk     03/22/2024   10:13 AM 01/06/2024   10:01 AM 08/05/2023    9:55 AM 04/02/2023    9:10 AM 01/14/2023    8:07 AM  Fall Risk   Falls in the past year? 0 0 0 0 1  Number falls in past yr: 0 0 0 0 0  Injury with Fall? 0 0 0 0 1  Risk for fall due to : No Fall Risks No Fall Risks No Fall Risks No Fall Risks History of fall(s)  Follow up Falls prevention discussed;Falls evaluation completed Falls evaluation completed Falls evaluation completed Falls evaluation completed Falls evaluation completed    MEDICARE RISK AT HOME:  Medicare Risk at Home Any stairs in or around the home?: No If so, are there any without handrails?: No Home free of loose throw  rugs in walkways, pet beds, electrical cords, etc?: Yes Adequate lighting in your home to reduce risk of falls?: Yes Life alert?: No Use of a cane, walker or w/c?: No Grab bars in the bathroom?: Yes Shower chair or bench in shower?: No Elevated toilet seat or a handicapped toilet?: Yes  TIMED UP AND GO:  Was the test performed?  No  Cognitive Function: 6CIT completed        03/22/2024   10:12 AM 09/08/2022    2:18 PM 08/31/2020    8:20 AM 08/31/2019    1:27 PM 04/01/2017   10:48 AM  6CIT Screen  What Year? 0 points 0 points 0 points 0 points 0 points  What month? 0 points 0 points 0 points 0 points 0 points  What time? 0 points 0 points 0 points 0 points 0 points  Count back from 20 0 points 0 points 0 points 0 points 0 points  Months in reverse 0 points 0 points 0 points 0 points 0 points  Repeat phrase 0 points 0 points 0 points 0 points 0 points  Total Score 0 points 0 points 0 points 0 points 0 points    Immunizations Immunization History  Administered Date(s) Administered   Fluad Quad(high Dose 65+) 08/31/2019, 08/21/2020, 08/02/2021, 09/04/2022   Fluad Trivalent(High Dose 65+)  08/05/2023   Influenza Split 09/11/2014   Influenza Whole 10/06/2006, 08/22/2009   Influenza,inj,Quad PF,6+ Mos 07/28/2018   Moderna Covid-19 Vaccine Bivalent Booster 72yrs & up 08/05/2022, 10/05/2023   Moderna SARS-COV2 Booster Vaccination 10/08/2021   Moderna Sars-Covid-2 Vaccination 01/08/2020, 02/08/2020, 10/09/2020, 05/13/2021   Pneumococcal Conjugate-13 02/14/2015   Pneumococcal Polysaccharide-23 09/22/2013, 09/12/2019   Tdap 01/26/2013, 02/04/2023   Zoster Recombinant(Shingrix ) 04/06/2019, 06/28/2019   Zoster, Live 10/18/2014    Screening Tests Health Maintenance  Topic Date Due   DEXA SCAN  12/18/2021   COVID-19 Vaccine (8 - 2024-25 season) 11/30/2023   FOOT EXAM  01/15/2024   Diabetic kidney evaluation - Urine ACR  03/22/2024   OPHTHALMOLOGY EXAM  05/18/2024   INFLUENZA VACCINE   07/01/2024   HEMOGLOBIN A1C  07/05/2024   MAMMOGRAM  12/27/2024   Diabetic kidney evaluation - eGFR measurement  01/05/2025   Medicare Annual Wellness (AWV)  03/22/2025   Colonoscopy  08/01/2025   DTaP/Tdap/Td (3 - Td or Tdap) 02/03/2033   Pneumonia Vaccine 34+ Years old  Completed   Hepatitis C Screening  Completed   Zoster Vaccines- Shingrix   Completed   HPV VACCINES  Aged Out   Meningococcal B Vaccine  Aged Out    Health Maintenance  Health Maintenance Due  Topic Date Due   DEXA SCAN  12/18/2021   COVID-19 Vaccine (8 - 2024-25 season) 11/30/2023   FOOT EXAM  01/15/2024   Diabetic kidney evaluation - Urine ACR  03/22/2024   Health Maintenance Items Addressed: DEXA ordered, Labs Ordered:, UACR (Urine Albumin:Creatinine Ratio)  Additional Screening:  Vision Screening: Recommended annual ophthalmology exams for early detection of glaucoma and other disorders of the eye.  Dental Screening: Recommended annual dental exams for proper oral hygiene  Community Resource Referral / Chronic Care Management: CRR required this visit?  No   CCM required this visit?  No     Plan:     I have personally reviewed and noted the following in the patient's chart:   Medical and social history Use of alcohol , tobacco or illicit drugs  Current medications and supplements including opioid prescriptions. Patient is not currently taking opioid prescriptions. Functional ability and status Nutritional status Physical activity Advanced directives List of other physicians Hospitalizations, surgeries, and ER visits in previous 12 months Vitals Screenings to include cognitive, depression, and falls Referrals and appointments  In addition, I have reviewed and discussed with patient certain preventive protocols, quality metrics, and best practice recommendations. A written personalized care plan for preventive services as well as general preventive health recommendations were provided to  patient.     Rhaya Coale Robinson Brinkley, CMA   03/22/2024   After Visit Summary: (Mail) Due to this being a telephonic visit, the after visit summary with patients personalized plan was offered to patient via mail   Notes: Please refer to Routing Comments.

## 2024-03-31 ENCOUNTER — Telehealth: Payer: Self-pay

## 2024-03-31 NOTE — Telephone Encounter (Signed)
 Copied from CRM [id number]. Topic: Appointments - Appointment Info/Confirmation >> Mar 31, 2024 10:32 AM Tina Wilkins wrote: Patient is calling in because she has a letter that has an appointment on 04/01/24 and it's not showing on the schedule. Patient wants to confirm if shde has an appointment that day.

## 2024-04-05 ENCOUNTER — Ambulatory Visit (HOSPITAL_COMMUNITY)
Admission: RE | Admit: 2024-04-05 | Discharge: 2024-04-05 | Disposition: A | Discharge status: Home / Self Care | Source: Ambulatory Visit | Attending: Internal Medicine | Admitting: Internal Medicine

## 2024-04-05 DIAGNOSIS — Z780 Asymptomatic menopausal state: Secondary | ICD-10-CM | POA: Insufficient documentation

## 2024-04-21 ENCOUNTER — Other Ambulatory Visit: Payer: Self-pay | Admitting: Family Medicine

## 2024-04-22 ENCOUNTER — Other Ambulatory Visit (HOSPITAL_COMMUNITY): Payer: Self-pay

## 2024-04-22 ENCOUNTER — Other Ambulatory Visit: Payer: Self-pay

## 2024-04-22 MED ORDER — TRIAMTERENE-HCTZ 75-50 MG PO TABS
1.0000 | ORAL_TABLET | Freq: Every day | ORAL | 3 refills | Status: AC
  Filled 2024-04-22: qty 90, 90d supply, fill #0
  Filled 2024-07-25: qty 90, 90d supply, fill #1
  Filled 2024-10-25: qty 90, 90d supply, fill #2

## 2024-04-22 MED ORDER — METOPROLOL SUCCINATE ER 25 MG PO TB24
25.0000 mg | ORAL_TABLET | Freq: Every day | ORAL | 3 refills | Status: AC
  Filled 2024-04-22: qty 90, 90d supply, fill #0
  Filled 2024-07-25: qty 90, 90d supply, fill #1
  Filled 2024-10-25: qty 90, 90d supply, fill #2

## 2024-04-29 ENCOUNTER — Telehealth: Payer: Self-pay | Admitting: Cardiology

## 2024-04-29 ENCOUNTER — Other Ambulatory Visit: Payer: Self-pay

## 2024-04-29 ENCOUNTER — Other Ambulatory Visit (HOSPITAL_COMMUNITY): Payer: Self-pay

## 2024-04-29 MED ORDER — CLONIDINE HCL 0.3 MG PO TABS
0.3000 mg | ORAL_TABLET | Freq: Every day | ORAL | 0 refills | Status: DC
  Filled 2024-04-29: qty 90, 90d supply, fill #0

## 2024-04-29 NOTE — Telephone Encounter (Signed)
 BP:  5/29- 136/94 126 5/30: 128/78 85

## 2024-04-29 NOTE — Telephone Encounter (Signed)
 Pt's medication was sent to pt's pharmacy as requested. Confirmation received.

## 2024-04-29 NOTE — Telephone Encounter (Signed)
 Patient c/o Palpitations: STAT if patient c/o lightheadedness, shortness of breath, or chest pain  How long have you had palpitations/irregular HR/ Afib? Are you having the symptoms now?  Palpitations ongoing for about 1 week. No symptoms currently, but felt it when she woke up.  Are you currently experiencing lightheadedness, SOB or CP?  No   Do you have a history of afib (atrial fibrillation) or irregular heart rhythm?  Hx irregular HR   Have you checked your BP or HR? (document readings if available):    Are you experiencing any other symptoms?  Headaches, chest pain    Pt c/o of Chest Pain: STAT if active (IN THIS MOMENT) CP, including tightness, pressure, jaw pain, shoulder/upper arm/back pain, SOB, nausea, and vomiting.  1. Are you having CP right now (tightness, pressure, or discomfort)?  No   2. Are you experiencing any other symptoms (ex. SOB, nausea, vomiting, sweating)?  No   3. How long have you been experiencing CP?  1 week  4. Is your CP continuous or coming and going?  Coming and going   5. Have you taken Nitroglycerin?  No

## 2024-04-29 NOTE — Telephone Encounter (Signed)
*  STAT* If patient is at the pharmacy, call can be transferred to refill team.   1. Which medications need to be refilled? (please list name of each medication and dose if known)  cloNIDine  (CATAPRES ) 0.3 MG tablet   2. Which pharmacy/location (including street and city if local pharmacy) is medication to be sent to?[place] [facility]  3. Do they need a 30 day or 90 day supply?  90 day supply  Patient is completely out of medication.

## 2024-05-02 NOTE — Telephone Encounter (Signed)
 Agree with appt with Strader, if significnat ongoing symptoms prior to appt can take additional toprol  25mg  prn palpitations  Letta Raw MD

## 2024-05-02 NOTE — Telephone Encounter (Signed)
 Left a message for patient to call office back regarding recommendations from provider.

## 2024-05-02 NOTE — Telephone Encounter (Signed)
 Spoke with pt who reports having palpitations and chest pain for one week. Pt states that the last time she had chest pain was Friday when she was working in the yard. Pt states that she stopped working and drank some water and it went away. Pt does complain of being dizzy at times. No other complaints at this time. Appt made for Thursday with Myla Artist, PA. Please advise.

## 2024-05-02 NOTE — Telephone Encounter (Signed)
 Returned call to pt. No answer. Mailbox is full.

## 2024-05-03 NOTE — Progress Notes (Unsigned)
 Cardiology Office Note    Date:  05/05/2024  ID:  Tina Wilkins, DOB 1956-05-19, MRN [id number] Cardiologist: Teddie Favre, MD    History of Present Illness:    Tina Wilkins is a 68 y.o. female with past medical history of SVT, HTN, HLD and Type II DM who presents to the office today for overdue follow-up and for evaluation of chest pain.   She was last examined by Dr. Londa Rival in 10/2022 and denied any recent chest pain or palpitations at that time. She was continued on her current cardiac medications with Amlodipine  5 mg daily, ASA 81 mg daily, Clonidine  0.3 mg at bedtime, Toprol -XL 25 mg daily, Crestor  40 mg daily and Triamterene -HCTZ 75-50 mg daily. She was informed to follow-up in 1 year but has not been evaluated by Cardiology since.  The patient contacted the office on 05/02/2024 reporting intermittent chest pain and palpitations for the past week. Reported pain had resolved with consuming water and she also reported intermittent dizziness at times. A follow-up visit was arranged and she was encouraged to take an extra Toprol -XL 25 mg if needed for palpitations.  In talking with the patient today, she reports having intermittent episodes of chest discomfort over the past few weeks. Reports the pain initially occurred while using her zero-turn riding mower and she felt a discomfort along her shoulders bilaterally but then had some sternal pain as well. She consumed a soda and this improved. Reports having intermittent episodes of pain since and most episodes have occurred at rest. Can last up to 30 minutes though.  Reports consuming soda or taking ASA has helped with these as well. Says that she has just "felt off" since the initial episode. Reports having intermittent balance issues which have occurred in the past as well. Reports occasional palpitations but no persistent symptoms. No specific orthopnea, PND or pitting edema. Does have intermittent swelling along her joints and has known  arthritis. Says that she has lost over 30 pounds within the past several months with dietary changes.  Studies Reviewed:   EKG: EKG is ordered today and demonstrates:   EKG Interpretation Date/Time:  Thursday May 05 2024 09:53:35 EDT Ventricular Rate:  58 PR Interval:  276 QRS Duration:  114 QT Interval:  446 QTC Calculation: 437 R Axis:   -65  Text Interpretation: Sinus bradycardia with 1st degree A-V block Left anterior fascicular block Moderate voltage criteria for LVH, may be normal variant ( R in aVL , Cornell product ) No acute ST changes. Confirmed by Woodfin Hays ([id number]) on 05/05/2024 9:59:16 AM       Echocardiogram: 03/2016 Study Conclusions   - Left ventricle: The cavity size was normal. Wall thickness was    increased in a pattern of mild LVH. Systolic function was normal.    The estimated ejection fraction was in the range of 55% to 60%.    Wall motion was normal; there were no regional wall motion    abnormalities. Features are consistent with a pseudonormal left    ventricular filling pattern, with concomitant abnormal relaxation    and increased filling pressure (grade 2 diastolic dysfunction).  - Aortic valve: Trileaflet; mildly calcified leaflets.  - Mitral valve: Calcified annulus. There was trivial regurgitation.  - Left atrium: The atrium was at the upper limits of normal in    size.  - Atrial septum: No defect or patent foramen ovale was identified.  - Tricuspid valve: There was trivial regurgitation. Peak RV-RA  gradient (S): 24 mm Hg.  - Pulmonary arteries: Systolic pressure could not be accurately    estimated.  - Pericardium, extracardiac: There was no pericardial effusion.   Impressions:   - Mild LVH with LVEF 55-60%. Rate 2 diastolic dysfunction with    increased LV filling pressure. Upper normal left atrial chamber    size. MAC with trivial mitral regurgitation. Mildly sclerotic    aortic valve. Trivial tricuspid regurgitation with  RV-RA gradient    24 mmHg.Aaron Aas    Holter Monitor: 03/2016 48 hour Holter monitor. Sinus rhythm noted. Heart rate ranged from 48 bpm up to 100 bpm with average heart rate 75 bpm. There were episodes of regular SVT at 129, 142, and 148 bpm noted transiently. Rare PACs and PVCs. No pauses.    Physical Exam:   VS:  BP 130/72 (BP Location: Left Arm)   Pulse (!) 58   Ht 5\' 3"  (1.6 m)   Wt 235 lb 12.8 oz (107 kg)   SpO2 98%   BMI 41.77 kg/m    Wt Readings from Last 3 Encounters:  05/05/24 235 lb 12.8 oz (107 kg)  03/22/24 228 lb (103.4 kg)  01/06/24 234 lb 1.9 oz (106.2 kg)     GEN: Well nourished, well developed female appearing in no acute distress NECK: No JVD; No carotid bruits CARDIAC: RRR, no murmurs, rubs, gallops RESPIRATORY:  Clear to auscultation without rales, wheezing or rhonchi  ABDOMEN: Appears non-distended. No obvious abdominal masses. EXTREMITIES: No clubbing or cyanosis. No pitting edema.  Distal pedal pulses are 2+ bilaterally.   Assessment and Plan:   1. Chest Pain with Mixed Features - Her episodes of chest pain have been sporadic over the past several weeks and episodes can occur at rest or with activity. Some episodes have resolved with soda consumption but at other times she has taken ASA with improvement. Reports bilateral shoulder pain intermittently as well and this sometimes occurs at rest but at other times has been exacerbated with movement. - Given her new symptoms and multiple cardiac risk factors, will arrange for a Coronary CTA for ischemic evaluation. Continue ASA 81 mg daily, Toprol -XL 25 mg daily and Crestor  40 mg daily. Reviewed the option of providing Rx for SL NTG and she declines at this time.   2. Palpitations/History of SVT - Prior monitor in 2017 showed occasional episodes of SVT as discussed above. Denies any persistent symptoms. EKG today shows normal sinus rhythm. Given her heart rate in the 50's, would continue Toprol -XL 25 mg daily and not  further titrate at this time. Was encouraged to continue to limit caffeine  intake.  3. HTN - BP is well-controlled at 130/72 during today's visit. Continue Amlodipine  5 mg daily, Clonidine  0.3 mg daily, Toprol -XL 25 mg daily and Triamterene -HCTZ 75-50 mg daily.  4. HLD - LDL was at 71 when checked in 01/2024. Continue current medical therapy with Crestor  40 mg daily.  Signed, Dorma Gash, PA-C

## 2024-05-05 ENCOUNTER — Ambulatory Visit: Attending: Student | Admitting: Student

## 2024-05-05 ENCOUNTER — Encounter: Payer: Self-pay | Admitting: Student

## 2024-05-05 VITALS — BP 130/72 | HR 58 | Ht 63.0 in | Wt 235.8 lb

## 2024-05-05 DIAGNOSIS — E782 Mixed hyperlipidemia: Secondary | ICD-10-CM | POA: Insufficient documentation

## 2024-05-05 DIAGNOSIS — R079 Chest pain, unspecified: Secondary | ICD-10-CM | POA: Insufficient documentation

## 2024-05-05 DIAGNOSIS — R072 Precordial pain: Secondary | ICD-10-CM | POA: Insufficient documentation

## 2024-05-05 DIAGNOSIS — I10 Essential (primary) hypertension: Secondary | ICD-10-CM | POA: Diagnosis present

## 2024-05-05 DIAGNOSIS — I471 Supraventricular tachycardia, unspecified: Secondary | ICD-10-CM | POA: Insufficient documentation

## 2024-05-05 NOTE — Telephone Encounter (Signed)
 Pt seen in office today with B. Strader.

## 2024-05-05 NOTE — Patient Instructions (Addendum)
 Medication Instructions:  Your physician recommends that you continue on your current medications as directed. Please refer to the Current Medication list given to you today.  Hold Maxide the morning of your Cardiac CT Scan.   *If you need a refill on your cardiac medications before your next appointment, please call your pharmacy*  Lab Work: Your physician recommends that you return for lab work. ( BMET)   If you have labs (blood work) drawn today and your tests are completely normal, you will receive your results only by: MyChart Message (if you have MyChart) OR A paper copy in the mail If you have any lab test that is abnormal or we need to change your treatment, we will call you to review the results.  Testing/Procedures:   Your cardiac CT will be scheduled at one of the below locations:    [facility] [address] [place], [place] [address] [phone]  OR   [facility] [address]  [place], [place] [address] Opening March 28, 2024  If scheduled at [facility], please arrive at the [facility] and [facility]'s Entrance (Entrance C2) of [facility] 30 minutes prior to test start time. You can use the FREE valet parking offered at entrance C (encouraged to control the heart rate for the test)  Proceed to the [facility] Radiology Department (first floor) to check-in and test prep.   All radiology patients and guests should use entrance C2 at [facility], accessed from [facility], even though the hospital's physical address listed is [address].    If scheduled at the Heart and Vascular Tower at [address], please enter the parking lot using the [address] entrance and use the FREE valet service at the patient drop-off area. Enter the buidling and check-in with registration on the main floor.  If scheduled at [facility], please arrive 30 minutes early for  check-in and test prep.  Please follow these instructions carefully (unless otherwise directed):  An IV will be required for this test and Nitroglycerin will be given.  Hold all erectile dysfunction medications at least 3 days (72 hrs) prior to test. (Ie viagra, cialis, sildenafil, tadalafil, etc)   On the Night Before the Test: Be sure to Drink plenty of water. Do not consume any caffeinated/decaffeinated beverages or chocolate 12 hours prior to your test. Do not take any antihistamines 12 hours prior to your test.  On the Day of the Test: Drink plenty of water until 1 hour prior to the test. Do not eat any food 1 hour prior to test. You may take your regular medications prior to the test.  Take metoprolol  (Lopressor ) two hours prior to test. If you take Furosemide /Hydrochlorothiazide /Spironolactone/Chlorthalidone, please HOLD on the morning of the test. Patients who wear a continuous glucose monitor MUST remove the device prior to scanning. FEMALES- please wear underwire-free bra if available, avoid dresses & tight clothing  *For Clinical Staff only. Please instruct patient the following:* Heart Rate Medication Recommendations for Cardiac CT  Resting HR < 50 bpm  No medication  Resting HR 50-60 bpm and BP >110/50 mmHG   Consider Metoprolol  tartrate 25 mg PO 90-120 min prior to scan  Resting HR 60-65 bpm and BP >110/50 mmHG  Metoprolol  tartrate 50 mg PO 90-120 minutes prior to scan   Resting HR > 65 bpm and BP >110/50 mmHG  Metoprolol  tartrate 100 mg PO 90-120 minutes prior to  scan  Consider Ivabradine 10-15 mg PO or a calcium  channel blocker for resting HR >60 bpm and contraindication to metoprolol  tartrate  Consider Ivabradine 10-15 mg PO in combination with metoprolol  tartrate for HR >80 bpm        After the Test: Drink plenty of water. After receiving IV contrast, you may experience a mild flushed feeling. This is normal. On occasion, you may experience a mild rash up to 24  hours after the test. This is not dangerous. If this occurs, you can take Benadryl 25 mg, Zyrtec, Claritin, or Allegra and increase your fluid intake. (Patients taking Tikosyn should avoid Benadryl, and may take Zyrtec, Claritin, or Allegra) If you experience trouble breathing, this can be serious. If it is severe call 911 IMMEDIATELY. If it is mild, please call our office.  We will call to schedule your test 2-4 weeks out understanding that some insurance companies will need an authorization prior to the service being performed.   For more information and frequently asked questions, please visit our website : [email or web address]  For non-scheduling related questions, please contact the cardiac imaging nurse navigator should you have any questions/concerns: Cardiac Imaging Nurse Navigators Direct Office Dial: [phone]   For scheduling needs, including cancellations and rescheduling, please call [place], [phone].   Follow-Up: At [facility], you and your health needs are our priority.  As part of our continuing mission to provide you with exceptional heart care, our providers are all part of one team.  This team includes your primary Cardiologist (physician) and Advanced Practice Providers or APPs (Physician Assistants and Nurse Practitioners) who all work together to provide you with the care you need, when you need it.  Your next appointment:   3 month(s)  Provider:   You may see Teddie Favre, MD or one of the following Advanced Practice Providers on your designated Care Team:   Woodfin Hays, PA-C  [place], [place] Theotis Flake, [place]     We recommend signing up for the patient portal called "MyChart".  Sign up information is provided on this After Visit Summary.  MyChart is used to connect with patients for Virtual Visits (Telemedicine).  Patients are able to view lab/test results, encounter notes, upcoming appointments, etc.  Non-urgent  messages can be sent to your provider as well.   To learn more about what you can do with MyChart, go to [email or web address].   Other Instructions Thank you for choosing [facility]!

## 2024-05-09 ENCOUNTER — Other Ambulatory Visit (HOSPITAL_COMMUNITY)
Admission: RE | Admit: 2024-05-09 | Discharge: 2024-05-09 | Disposition: A | Discharge status: Home / Self Care | Source: Ambulatory Visit | Attending: Student | Admitting: Student

## 2024-05-09 ENCOUNTER — Telehealth (HOSPITAL_COMMUNITY): Payer: Self-pay | Admitting: *Deleted

## 2024-05-09 ENCOUNTER — Ambulatory Visit: Payer: Self-pay | Admitting: Student

## 2024-05-09 ENCOUNTER — Other Ambulatory Visit (HOSPITAL_COMMUNITY)
Admission: RE | Admit: 2024-05-09 | Discharge: 2024-05-09 | Disposition: A | Discharge status: Home / Self Care | Source: Ambulatory Visit | Attending: Family Medicine | Admitting: Family Medicine

## 2024-05-09 DIAGNOSIS — I10 Essential (primary) hypertension: Secondary | ICD-10-CM | POA: Diagnosis not present

## 2024-05-09 DIAGNOSIS — E559 Vitamin D deficiency, unspecified: Secondary | ICD-10-CM | POA: Insufficient documentation

## 2024-05-09 DIAGNOSIS — R079 Chest pain, unspecified: Secondary | ICD-10-CM | POA: Insufficient documentation

## 2024-05-09 DIAGNOSIS — R7989 Other specified abnormal findings of blood chemistry: Secondary | ICD-10-CM | POA: Insufficient documentation

## 2024-05-09 DIAGNOSIS — E876 Hypokalemia: Secondary | ICD-10-CM

## 2024-05-09 DIAGNOSIS — E1159 Type 2 diabetes mellitus with other circulatory complications: Secondary | ICD-10-CM | POA: Insufficient documentation

## 2024-05-09 DIAGNOSIS — I471 Supraventricular tachycardia, unspecified: Secondary | ICD-10-CM | POA: Diagnosis not present

## 2024-05-09 LAB — BASIC METABOLIC PANEL WITH GFR
Anion gap: 12 (ref 5–15)
BUN: 15 mg/dL (ref 8–23)
CO2: 23 mmol/L (ref 22–32)
Calcium: 9.8 mg/dL (ref 8.9–10.3)
Chloride: 104 mmol/L (ref 98–111)
Creatinine, Ser: 0.93 mg/dL (ref 0.44–1.00)
GFR, Estimated: >60 mL/min (ref >60)
Glucose, Bld: 116 mg/dL — ABNORMAL HIGH (ref 70–99)
Potassium: 2.9 mmol/L — ABNORMAL LOW (ref 3.5–5.1)
Sodium: 139 mmol/L (ref 135–145)

## 2024-05-09 LAB — CBC
HCT: 37.4 % (ref 36.0–46.0)
Hemoglobin: 12.2 g/dL (ref 12.0–15.0)
MCH: 28.2 pg (ref 26.0–34.0)
MCHC: 32.6 g/dL (ref 30.0–36.0)
MCV: 86.4 fL (ref 80.0–100.0)
Platelets: 353 10*3/uL (ref 150–400)
RBC: 4.33 MIL/uL (ref 3.87–5.11)
RDW: 15.2 % (ref 11.5–15.5)
WBC: 6.6 10*3/uL (ref 4.0–10.5)
nRBC: 0 % (ref 0.0–0.2)

## 2024-05-09 LAB — HEMOGLOBIN A1C
Hgb A1c MFr Bld: 7 % — ABNORMAL HIGH (ref 4.8–5.6)
Mean Plasma Glucose: 154.2 mg/dL

## 2024-05-09 LAB — VITAMIN D 25 HYDROXY (VIT D DEFICIENCY, FRACTURES): Vit D, 25-Hydroxy: 38.65 ng/mL (ref 30–100)

## 2024-05-09 LAB — TSH: TSH: 2.273 u[IU]/mL (ref 0.350–4.500)

## 2024-05-09 NOTE — Telephone Encounter (Signed)
 Attempted to call patient regarding upcoming cardiac CT appointment. Left message on voicemail with name and callback number  Larey Brick RN Navigator Cardiac Imaging [facility] V[facility] Services [phone] Office [phone] Cell

## 2024-05-10 ENCOUNTER — Ambulatory Visit (HOSPITAL_COMMUNITY)
Admission: RE | Admit: 2024-05-10 | Discharge: 2024-05-10 | Disposition: A | Discharge status: Home / Self Care | Source: Ambulatory Visit | Attending: Student | Admitting: Student

## 2024-05-10 DIAGNOSIS — I2510 Atherosclerotic heart disease of native coronary artery without angina pectoris: Secondary | ICD-10-CM | POA: Insufficient documentation

## 2024-05-10 DIAGNOSIS — R072 Precordial pain: Secondary | ICD-10-CM

## 2024-05-10 LAB — MICROALBUMIN / CREATININE URINE RATIO
Creatinine, Urine: 102.3 mg/dL
Microalb Creat Ratio: 15 mg/g{creat} (ref 0–29)
Microalb, Ur: 15.1 ug/mL — ABNORMAL HIGH

## 2024-05-10 MED ORDER — NITROGLYCERIN 0.4 MG SL SUBL
0.8000 mg | SUBLINGUAL_TABLET | Freq: Once | SUBLINGUAL | Status: AC
  Administered 2024-05-10: 0.8 mg via SUBLINGUAL

## 2024-05-10 MED ORDER — NITROGLYCERIN 0.4 MG SL SUBL
SUBLINGUAL_TABLET | SUBLINGUAL | Status: AC
Start: 2024-05-10 — End: ?
  Filled 2024-05-10: qty 2

## 2024-05-10 MED ORDER — IOHEXOL 350 MG/ML SOLN
100.0000 mL | Freq: Once | INTRAVENOUS | Status: AC | PRN
  Administered 2024-05-10: 100 mL via INTRAVENOUS

## 2024-05-11 MED ORDER — POTASSIUM CHLORIDE CRYS ER 20 MEQ PO TBCR
EXTENDED_RELEASE_TABLET | ORAL | 3 refills | Status: AC
Start: 2024-05-11 — End: 2025-05-12

## 2024-05-11 NOTE — Telephone Encounter (Signed)
-----   Message from [place] sent at 05/09/2024  8:54 PM EDT ----- Please let the patient know her kidney function remains stable but her potassium is significantly low at 2.9. She is not listed as being on potassium supplementation. Would have her take K-dur 60 mEq for today and then 20 mEq daily. Can send in 20 mEq tablets but she needs to take 3 today for a total of 60 mEq. Repeat BMET in 3-4 days for reassessment.

## 2024-05-11 NOTE — Telephone Encounter (Signed)
 The patient has been notified of the result and verbalized understanding.  All questions (if any) were answered. Carole Churches, LPN 6/57/8469 6:29 AM

## 2024-05-17 ENCOUNTER — Other Ambulatory Visit (HOSPITAL_COMMUNITY)
Admission: RE | Admit: 2024-05-17 | Discharge: 2024-05-17 | Disposition: A | Discharge status: Home / Self Care | Source: Ambulatory Visit | Attending: Student | Admitting: Student

## 2024-05-17 ENCOUNTER — Ambulatory Visit: Payer: Self-pay | Admitting: Student

## 2024-05-17 DIAGNOSIS — E876 Hypokalemia: Secondary | ICD-10-CM | POA: Insufficient documentation

## 2024-05-17 LAB — BASIC METABOLIC PANEL WITH GFR
Anion gap: 11 (ref 5–15)
BUN: 19 mg/dL (ref 8–23)
CO2: 26 mmol/L (ref 22–32)
Calcium: 9.6 mg/dL (ref 8.9–10.3)
Chloride: 103 mmol/L (ref 98–111)
Creatinine, Ser: 1.17 mg/dL — ABNORMAL HIGH (ref 0.44–1.00)
GFR, Estimated: 51 mL/min — ABNORMAL LOW (ref >60)
Glucose, Bld: 116 mg/dL — ABNORMAL HIGH (ref 70–99)
Potassium: 3.4 mmol/L — ABNORMAL LOW (ref 3.5–5.1)
Sodium: 140 mmol/L (ref 135–145)

## 2024-05-20 ENCOUNTER — Other Ambulatory Visit: Payer: Self-pay

## 2024-05-20 DIAGNOSIS — I10 Essential (primary) hypertension: Secondary | ICD-10-CM

## 2024-05-20 DIAGNOSIS — E876 Hypokalemia: Secondary | ICD-10-CM

## 2024-05-24 ENCOUNTER — Encounter: Payer: Self-pay | Admitting: Family Medicine

## 2024-05-24 ENCOUNTER — Other Ambulatory Visit (HOSPITAL_BASED_OUTPATIENT_CLINIC_OR_DEPARTMENT_OTHER): Payer: Self-pay

## 2024-05-24 ENCOUNTER — Telehealth: Payer: Self-pay

## 2024-05-24 ENCOUNTER — Ambulatory Visit (INDEPENDENT_AMBULATORY_CARE_PROVIDER_SITE_OTHER): Admitting: Family Medicine

## 2024-05-24 VITALS — BP 117/78 | HR 67 | Resp 16 | Ht 63.0 in | Wt 231.1 lb

## 2024-05-24 DIAGNOSIS — I10 Essential (primary) hypertension: Secondary | ICD-10-CM

## 2024-05-24 DIAGNOSIS — E782 Mixed hyperlipidemia: Secondary | ICD-10-CM

## 2024-05-24 DIAGNOSIS — Z6841 Body Mass Index (BMI) 40.0 and over, adult: Secondary | ICD-10-CM

## 2024-05-24 DIAGNOSIS — E1169 Type 2 diabetes mellitus with other specified complication: Secondary | ICD-10-CM | POA: Diagnosis not present

## 2024-05-24 MED ORDER — ROSUVASTATIN CALCIUM 40 MG PO TABS
40.0000 mg | ORAL_TABLET | Freq: Every day | ORAL | 3 refills | Status: AC
  Filled 2024-05-24 – 2024-07-25 (×2): qty 90, 90d supply, fill #0
  Filled 2024-10-25: qty 90, 90d supply, fill #1
  Filled 2024-10-26: qty 90, 90d supply, fill #0

## 2024-05-24 MED ORDER — SEMAGLUTIDE (1 MG/DOSE) 4 MG/3ML ~~LOC~~ SOPN
1.0000 mg | PEN_INJECTOR | SUBCUTANEOUS | 5 refills | Status: AC
Start: 2024-05-24 — End: ?
  Filled 2024-05-24: qty 3, 28d supply, fill #0
  Filled 2024-10-25: qty 3, 28d supply, fill #1

## 2024-05-24 NOTE — Progress Notes (Signed)
 Tina Wilkins     MRN: [id number]      DOB: [birth year]  Chief Complaint  Patient presents with   Medical Management of Chronic Issues    Follow up     HPI Tina Wilkins is here for follow up and re-evaluation of chronic medical conditions, medication management and review of any available recent lab and radiology data.  Preventive health is updated, specifically  Cancer screening and Immunization.    The PT reports being able to get ozempic  for one month only which is clearly very frustratig increased workload with [employer] responsibilities , plans to stop  some Denies polyuria, polydipsia, blurred vision , or hypoglycemic episodes.   ROS Denies recent fever or chills. Denies sinus pressure, nasal congestion, ear pain or sore throat. Denies chest congestion, productive cough or wheezing. Denies chest pains, palpitations and leg swelling Denies abdominal pain, nausea, vomiting,diarrhea or constipation.   Denies dysuria, frequency, hesitancy or incontinence. Denies joint pain, swelling and limitation in mobility. Denies headaches, seizures, numbness, or tingling. C/o anxiety, stress and poor sleep overwhelmed with church responsibilities Denies skin break down or rash.   PE  BP 117/78   Pulse 67   Resp 16   Ht 5' 3 (1.6 m)   Wt 231 lb 1.9 oz (104.8 kg)   SpO2 97%   BMI 40.94 kg/m   Patient alert and oriented and in no cardiopulmonary distress.  HEENT: No facial asymmetry, EOMI,     Neck supple .  Chest: Clear to auscultation bilaterally.  CVS: S1, S2 no murmurs, no S3.Regular rate.  ABD: Soft non tender.   Ext: No edema  MS: Adequate ROM spine, shoulders, hips and knees.  Skin: Intact, no ulcerations or rash noted.  Psych: Good eye contact, normal affect. Memory intact not anxious or depressed appearing.  CNS: CN 2-12 intact, power,  normal throughout.no focal deficits noted.   Assessment & Plan  Hypertension goal BP (blood pressure) < 130/80 DASH diet  and commitment to daily physical activity for a minimum of 30 minutes discussed and encouraged, as a part of hypertension management. The importance of attaining a healthy weight is also discussed.     05/24/2024    8:34 AM 05/10/2024    8:50 AM 05/10/2024    8:40 AM 05/05/2024    9:48 AM 03/22/2024   10:07 AM 02/25/2024    9:24 AM 01/06/2024   10:26 AM  BP/Weight  Systolic BP 117 130 144 130 138 138 155  Diastolic BP 78 74 94 72 87 84 98  Wt. (Lbs) 231.12   235.8 228    BMI 40.94 kg/m2   41.77 kg/m2 40.07 kg/m2       Controlled, no change in medication   Hyperlipidemia Hyperlipidemia:Low fat diet discussed and encouraged.   Lipid Panel  Lab Results  Component Value Date   CHOL 165 01/06/2024   HDL 78 01/06/2024   LDLCALC 71 01/06/2024   TRIG 89 01/06/2024   CHOLHDL 2.1 01/06/2024     Controlled, no change in medication   Morbid obesity  Patient re-educated about  the importance of commitment to a  minimum of 150 minutes of exercise per week as able.  The importance of healthy food choices with portion control discussed, as well as eating regularly and within a 12 hour window most days. The need to choose clean , green food 50 to 75% of the time is discussed, as well as to make water the primary drink and set  a goal of 64 ounces water daily.       05/24/2024    8:34 AM 05/05/2024    9:48 AM 03/22/2024   10:07 AM  Weight /BMI  Weight 231 lb 1.9 oz 235 lb 12.8 oz 228 lb  Height 5' 3 (1.6 m) 5' 3 (1.6 m) 5' 3.25 (1.607 m)  BMI 40.94 kg/m2 41.77 kg/m2 40.07 kg/m2    Improved , she is applauded on this and encouraged to continue same  Type 2 diabetes mellitus with other specified complication (HCC) Diabetes associated with hypertension, hyperlipidemia, and obesity  Tina Wilkins is reminded of the importance of commitment to daily physical activity for 30 minutes or more, as able and the need to limit carbohydrate intake to 30 to 60 grams per meal to help with blood  sugar control.   The need to take medication as prescribed, test blood sugar as directed, and to call between visits if there is a concern that blood sugar is uncontrolled is also discussed.   Tina Wilkins is reminded of the importance of daily foot exam, annual eye examination, and good blood sugar, blood pressure and cholesterol control.     Latest Ref Rng & Units 05/17/2024    8:18 AM 05/09/2024    9:31 AM 05/09/2024    9:28 AM 05/09/2024    9:25 AM 01/06/2024   10:46 AM  Diabetic Labs  HbA1c 4.8 - 5.6 %  7.0    7.0   Microalbumin Not Estab. ug/mL   15.1     Micro/Creat Ratio 0 - 29 mg/g creat   15     Chol 100 - 199 mg/dL     834   HDL >60 mg/dL     78   Calc LDL 0 - 99 mg/dL     71   Triglycerides 0 - 149 mg/dL     89   Creatinine 9.55 - 1.00 mg/dL 8.82    9.06  9.05       05/24/2024    8:34 AM 05/10/2024    8:50 AM 05/10/2024    8:40 AM 05/05/2024    9:48 AM 03/22/2024   10:07 AM 02/25/2024    9:24 AM 01/06/2024   10:26 AM  BP/Weight  Systolic BP 117 130 144 130 138 138 155  Diastolic BP 78 74 94 72 87 84 98  Wt. (Lbs) 231.12   235.8 228    BMI 40.94 kg/m2   41.77 kg/m2 40.07 kg/m2        Latest Ref Rng & Units 05/19/2023   12:00 AM 01/14/2023    8:00 AM  Foot/eye exam completion dates  Eye Exam No Retinopathy No Retinopathy       Foot Form Completion   Done     This result is from an external source.      Controlled, needs to get ozempic  as prescribed, wil work on this

## 2024-05-24 NOTE — Patient Instructions (Addendum)
 F/U in 13 weeks, call if you need me sooner  We will contact pharmacy to understand what is happening withyour diabetes med and will get bak to you  this week, and leave me a message by Friday as to your med status  Continue to chart your course in life  Fasting lipid, cmp and EGFR and HBA1C 3 to 5 days before next appt  It is important that you exercise regularly at least 30 minutes 5 times a week. If you develop chest pain, have severe difficulty breathing, or feel very tired, stop exercising immediately and seek medical attention   Think about what you will eat, plan ahead. Choose  clean, green, fresh or frozen over canned, processed or packaged foods which are more sugary, salty and fatty. 70 to 75% of food eaten should be vegetables and fruit. Three meals at set times with snacks allowed between meals, but they must be fruit or vegetables. Aim to eat over a 12 hour period , example 7 am to 7 pm, and STOP after  your last meal of the day. Drink water,generally about 64 ounces per day, no other drink is as healthy. Fruit juice is best enjoyed in a healthy way, by EATING the fruit.  Thanks for choosing [facility], we consider it a privelige to serve you.

## 2024-05-24 NOTE — Assessment & Plan Note (Signed)
 Diabetes associated with hypertension, hyperlipidemia, and obesity  Tina Wilkins is reminded of the importance of commitment to daily physical activity for 30 minutes or more, as able and the need to limit carbohydrate intake to 30 to 60 grams per meal to help with blood sugar control.   The need to take medication as prescribed, test blood sugar as directed, and to call between visits if there is a concern that blood sugar is uncontrolled is also discussed.   Tina Wilkins is reminded of the importance of daily foot exam, annual eye examination, and good blood sugar, blood pressure and cholesterol control.     Latest Ref Rng & Units 05/17/2024    8:18 AM 05/09/2024    9:31 AM 05/09/2024    9:28 AM 05/09/2024    9:25 AM 01/06/2024   10:46 AM  Diabetic Labs  HbA1c 4.8 - 5.6 %  7.0    7.0   Microalbumin Not Estab. ug/mL   15.1     Micro/Creat Ratio 0 - 29 mg/g creat   15     Chol 100 - 199 mg/dL     834   HDL >60 mg/dL     78   Calc LDL 0 - 99 mg/dL     71   Triglycerides 0 - 149 mg/dL     89   Creatinine 9.55 - 1.00 mg/dL 8.82    9.06  9.05       05/24/2024    8:34 AM 05/10/2024    8:50 AM 05/10/2024    8:40 AM 05/05/2024    9:48 AM 03/22/2024   10:07 AM 02/25/2024    9:24 AM 01/06/2024   10:26 AM  BP/Weight  Systolic BP 117 130 144 130 138 138 155  Diastolic BP 78 74 94 72 87 84 98  Wt. (Lbs) 231.12   235.8 228    BMI 40.94 kg/m2   41.77 kg/m2 40.07 kg/m2        Latest Ref Rng & Units 05/19/2023   12:00 AM 01/14/2023    8:00 AM  Foot/eye exam completion dates  Eye Exam No Retinopathy No Retinopathy       Foot Form Completion   Done     This result is from an external source.      Controlled, needs to get ozempic  as prescribed, wil work on this

## 2024-05-24 NOTE — Telephone Encounter (Signed)
 Tried calling pt to verify what pharmacy she wants her medications to go to

## 2024-05-24 NOTE — Assessment & Plan Note (Signed)
  Patient re-educated about  the importance of commitment to a  minimum of 150 minutes of exercise per week as able.  The importance of healthy food choices with portion control discussed, as well as eating regularly and within a 12 hour window most days. The need to choose clean , green food 50 to 75% of the time is discussed, as well as to make water the primary drink and set a goal of 64 ounces water daily.       05/24/2024    8:34 AM 05/05/2024    9:48 AM 03/22/2024   10:07 AM  Weight /BMI  Weight 231 lb 1.9 oz 235 lb 12.8 oz 228 lb  Height 5' 3 (1.6 m) 5' 3 (1.6 m) 5' 3.25 (1.607 m)  BMI 40.94 kg/m2 41.77 kg/m2 40.07 kg/m2    Improved , she is applauded on this and encouraged to continue same

## 2024-05-24 NOTE — Assessment & Plan Note (Signed)
 Hyperlipidemia:Low fat diet discussed and encouraged.   Lipid Panel  Lab Results  Component Value Date   CHOL 165 01/06/2024   HDL 78 01/06/2024   LDLCALC 71 01/06/2024   TRIG 89 01/06/2024   CHOLHDL 2.1 01/06/2024     Controlled, no change in medication

## 2024-05-24 NOTE — Assessment & Plan Note (Signed)
 DASH diet and commitment to daily physical activity for a minimum of 30 minutes discussed and encouraged, as a part of hypertension management. The importance of attaining a healthy weight is also discussed.     05/24/2024    8:34 AM 05/10/2024    8:50 AM 05/10/2024    8:40 AM 05/05/2024    9:48 AM 03/22/2024   10:07 AM 02/25/2024    9:24 AM 01/06/2024   10:26 AM  BP/Weight  Systolic BP 117 130 144 130 138 138 155  Diastolic BP 78 74 94 72 87 84 98  Wt. (Lbs) 231.12   235.8 228    BMI 40.94 kg/m2   41.77 kg/m2 40.07 kg/m2       Controlled, no change in medication

## 2024-05-25 ENCOUNTER — Other Ambulatory Visit: Payer: Self-pay

## 2024-05-25 ENCOUNTER — Other Ambulatory Visit (HOSPITAL_COMMUNITY): Payer: Self-pay

## 2024-06-28 ENCOUNTER — Encounter: Payer: Self-pay | Admitting: Cardiology

## 2024-06-28 ENCOUNTER — Ambulatory Visit: Attending: Cardiology | Admitting: Cardiology

## 2024-06-28 VITALS — BP 124/80 | HR 64 | Ht 63.25 in | Wt 228.0 lb

## 2024-06-28 DIAGNOSIS — E782 Mixed hyperlipidemia: Secondary | ICD-10-CM | POA: Insufficient documentation

## 2024-06-28 DIAGNOSIS — I471 Supraventricular tachycardia, unspecified: Secondary | ICD-10-CM | POA: Insufficient documentation

## 2024-06-28 DIAGNOSIS — I10 Essential (primary) hypertension: Secondary | ICD-10-CM | POA: Insufficient documentation

## 2024-06-28 DIAGNOSIS — R931 Abnormal findings on diagnostic imaging of heart and coronary circulation: Secondary | ICD-10-CM | POA: Insufficient documentation

## 2024-06-28 NOTE — Patient Instructions (Signed)
 Medication Instructions:  Your physician recommends that you continue on your current medications as directed. Please refer to the Current Medication list given to you today.   Labwork: None today  Testing/Procedures: None today  Follow-Up: 1 year  Any Other Special Instructions Will Be Listed Below (If Applicable).  If you need a refill on your cardiac medications before your next appointment, please call your pharmacy.

## 2024-06-28 NOTE — Progress Notes (Signed)
 Cardiology Office Note  Date: 06/28/2024   ID: Tina Wilkins, DOB [birth year], MRN [id number]  History of Present Illness: Tina Wilkins is a 68 y.o. female last seen by Ms. Strader PA-C in June, I reviewed her note (our last visit was in 2023.  She is here for a follow-up visit.  I reviewed her coronary CTA, results outlined below and overall reassuring.  She does not report any recurring chest pain overly suggestive of angina per today's discussion.  Symptoms sound a bit more musculoskeletal and have essentially resolved at this point.  She does not report any palpitations in association with the symptoms.  We went over her medications.  No changes from a antihypertensive perspective.  She remains on Toprol -XL 25 mg daily, resting heart rate in the 60s.  I reviewed her recent ECG and lab work.  LDL was 71 in February on Crestor  40 mg daily.  Physical Exam: VS:  BP 124/80 (BP Location: Right Arm, Cuff Size: Normal)   Pulse 64   Ht 5' 3.25 (1.607 m)   Wt 228 lb (103.4 kg)   SpO2 100%   BMI 40.07 kg/m , BMI Body mass index is 40.07 kg/m.  Wt Readings from Last 3 Encounters:  06/28/24 228 lb (103.4 kg)  05/24/24 231 lb 1.9 oz (104.8 kg)  05/05/24 235 lb 12.8 oz (107 kg)    General: Patient appears comfortable at rest. HEENT: Conjunctiva and lids normal. Neck: Supple, no elevated JVP or carotid bruits. Lungs: Clear to auscultation, nonlabored breathing at rest. Cardiac: Regular rate and rhythm, no S3 or significant systolic murmur.  ECG:  An ECG dated 05/05/2024 was personally reviewed today and demonstrated:  Sinus bradycardia with prolonged PR interval, left anterior fascicular block, increased voltage.  Labwork: 01/06/2024: ALT 29; AST 23 05/09/2024: Hemoglobin 12.2; Platelets 353; TSH 2.273 05/17/2024: BUN 19; Creatinine, Ser 1.17; Potassium 3.4; Sodium 140     Component Value Date/Time   CHOL 165 01/06/2024 1046   TRIG 89 01/06/2024 1046   HDL 78 01/06/2024 1046    CHOLHDL 2.1 01/06/2024 1046   CHOLHDL 2.6 11/14/2020 0811   VLDL 17 06/17/2017 1005   LDLCALC 71 01/06/2024 1046   LDLCALC 76 11/14/2020 0811   Other Studies Reviewed Today:  Coronary CTA 05/10/2024: FINDINGS: Aorta: Normal size.  No calcifications.  No dissection.   Aortic Valve: Trileaflet.  Mild calcification.   Coronary Arteries:  Normal coronary origin.  Right dominance.   RCA is a large dominant artery that gives rise to PDA and PLVB. There is mild (25-49%) soft plaque distal.   Left main is a large artery that gives rise to LAD and LCX arteries.   LAD is a large vessel that has minimal (<25%) soft plaque in the mid LAD and ostial D1.   LCX is a non-dominant artery that gives rise to one large OM1 branch. There is minimal (<25%) soft plaque in the mid LCX.   Coronary Calcium  Score:   Left main: 0   Left anterior descending artery: 6.1   Left circumflex artery: 0   Right coronary artery: 0   Total: 6.1   Percentile: 57th   Other findings:   Normal pulmonary vein drainage into the left atrium.   Normal let atrial appendage without a thrombus.   Normal size of the pulmonary artery.   Non-cardiac: See separate report from [facility].   IMPRESSION: 1. Coronary calcium  score of 6.1. This was 57th percentile for age-, sex, and race-matched  controls.   2. Total plaque volume 50 mm3 which is 25th percentile for age- and sex-matched controls (calcified plaque 83mm3; non-calcified plaque 0 mm3). TPV is mild.   3. Normal coronary origin with right dominance.   4. There is mild (25-49%) soft plaque in the distal RCA and minimal (<25%) plaque in the LAD and LCX. CAD-RADS 2.  Assessment and Plan:  1.  PSVT.  No increasing palpitations on current regimen including Toprol -XL 25 mg daily.  Would continue with observation.  2.  Coronary calcium  score of 6.1 with overall mild nonobstructive CAD by coronary CTA in June as outlined above.  Results  discussed today.  Plan to continue medical therapy and risk factor reduction.  She is on aspirin  81 mg daily and Crestor  40 mg daily.   3.  Primary hypertension.  No changes to current regimen which includes Norvasc  10 mg daily, clonidine  0.3 mg daily, and Maxide 75/50 mg daily.  Keep follow-up with Dr. Antonetta.   4.  Mixed hyperlipidemia.  LDL 71 in February.  Continue Crestor  40 mg daily for now.  Disposition:  Follow up 1 year.  Signed, Jayson JUDITHANN Sierras, M.D., F.A.C.C. [facility]

## 2024-07-05 ENCOUNTER — Other Ambulatory Visit: Payer: Self-pay

## 2024-07-05 ENCOUNTER — Other Ambulatory Visit (HOSPITAL_COMMUNITY): Payer: Self-pay

## 2024-07-05 MED ORDER — LUMIGAN 0.01 % OP SOLN
1.0000 [drp] | Freq: Every evening | OPHTHALMIC | 3 refills | Status: AC
  Filled 2024-07-05: qty 2.5, 25d supply, fill #0

## 2024-07-11 ENCOUNTER — Other Ambulatory Visit (HOSPITAL_COMMUNITY): Payer: Self-pay

## 2024-07-11 MED ORDER — LUMIGAN 0.01 % OP SOLN
1.0000 [drp] | Freq: Every evening | OPHTHALMIC | 3 refills | Status: AC
  Filled 2024-07-11: qty 2.5, 50d supply, fill #0
  Filled 2024-07-25: qty 2.5, 25d supply, fill #0

## 2024-07-14 ENCOUNTER — Other Ambulatory Visit (HOSPITAL_COMMUNITY): Payer: Self-pay

## 2024-07-25 ENCOUNTER — Other Ambulatory Visit: Payer: Self-pay | Admitting: Cardiology

## 2024-07-25 ENCOUNTER — Other Ambulatory Visit: Payer: Self-pay

## 2024-07-25 ENCOUNTER — Other Ambulatory Visit (HOSPITAL_COMMUNITY): Payer: Self-pay

## 2024-07-27 ENCOUNTER — Other Ambulatory Visit (HOSPITAL_COMMUNITY): Payer: Self-pay

## 2024-07-27 ENCOUNTER — Telehealth: Payer: Self-pay | Admitting: Cardiology

## 2024-07-27 ENCOUNTER — Encounter (HOSPITAL_COMMUNITY): Payer: Self-pay

## 2024-07-27 MED ORDER — CLONIDINE HCL 0.3 MG PO TABS
0.3000 mg | ORAL_TABLET | Freq: Every day | ORAL | 1 refills | Status: AC
  Filled 2024-07-27: qty 90, 90d supply, fill #0
  Filled 2024-10-25: qty 90, 90d supply, fill #1

## 2024-07-27 NOTE — Telephone Encounter (Signed)
*  STAT* If patient is at the pharmacy, call can be transferred to refill team.   1. Which medications need to be refilled? (please list name of each medication and dose if known) cloNIDine  (CATAPRES ) 0.3 MG tablet    2. Would you like to learn more about the convenience, safety, & potential cost savings by using the [facility]?   3. Are you open to using the [facility] (Type [facility]. ).   4. Which pharmacy/location (including street and city if local pharmacy) is medication to be sent to? [place] [facility]    5. Do they need a 30 day or 90 day supply? 90 day

## 2024-07-29 ENCOUNTER — Other Ambulatory Visit (HOSPITAL_COMMUNITY): Payer: Self-pay

## 2024-08-08 ENCOUNTER — Ambulatory Visit: Admitting: Cardiology

## 2024-08-10 ENCOUNTER — Telehealth: Payer: Self-pay

## 2024-08-10 NOTE — Telephone Encounter (Signed)
 Lvm

## 2024-08-10 NOTE — Telephone Encounter (Signed)
 Copied from CRM [id number]. Topic: General - Other >> Aug 10, 2024 10:26 AM Tonda B wrote: Reason for CRM: patient is calling in asking if she needs to get her blood work done this week or wait a week before her appointment 12/2024 please call patient back  [phone] (M)

## 2024-08-26 ENCOUNTER — Ambulatory Visit: Admitting: Family Medicine

## 2024-10-26 ENCOUNTER — Other Ambulatory Visit (HOSPITAL_BASED_OUTPATIENT_CLINIC_OR_DEPARTMENT_OTHER): Payer: Self-pay

## 2024-10-26 ENCOUNTER — Other Ambulatory Visit: Payer: Self-pay

## 2024-10-26 ENCOUNTER — Other Ambulatory Visit (HOSPITAL_COMMUNITY): Payer: Self-pay

## 2024-11-03 LAB — OPHTHALMOLOGY REPORT-SCANNED

## 2024-11-25 ENCOUNTER — Other Ambulatory Visit (HOSPITAL_COMMUNITY): Payer: Self-pay

## 2024-11-25 ENCOUNTER — Telehealth: Payer: Self-pay | Admitting: Pharmacy Technician

## 2024-11-25 NOTE — Telephone Encounter (Signed)
 Pharmacy Patient Advocate Encounter  Received notification from [facility] that Prior Authorization for Ozempic  (1 MG/DOSE) 4MG /3ML pen-injectors has been APPROVED from 11/25/2024 to 11/30/2025.   PA #/Case ID/Reference #: [id number]

## 2024-11-25 NOTE — Telephone Encounter (Signed)
 Pharmacy Patient Advocate Encounter   Received notification from Onbase that prior authorization for Ozempic  (1 MG/DOSE) 4MG /3ML pen-injectors is due for renewal.   Insurance verification completed.   The patient is insured through [employer].  Action: PA required; PA submitted to above mentioned insurance via Latent Key/confirmation #/[id number] Status is pending

## 2024-12-16 ENCOUNTER — Other Ambulatory Visit (HOSPITAL_COMMUNITY): Payer: Self-pay | Admitting: Family Medicine

## 2024-12-16 DIAGNOSIS — Z1231 Encounter for screening mammogram for malignant neoplasm of breast: Secondary | ICD-10-CM

## 2024-12-23 ENCOUNTER — Ambulatory Visit: Payer: Self-pay | Admitting: Family Medicine

## 2024-12-23 LAB — CMP14+EGFR
ALT: 33 IU/L — ABNORMAL HIGH (ref 0–32)
AST: 27 IU/L (ref 0–40)
Albumin: 4.3 g/dL (ref 3.9–4.9)
Alkaline Phosphatase: 98 IU/L (ref 49–135)
BUN/Creatinine Ratio: 16 (ref 12–28)
BUN: 17 mg/dL (ref 8–27)
Bilirubin Total: 0.6 mg/dL (ref 0.0–1.2)
CO2: 24 mmol/L (ref 20–29)
Calcium: 10.2 mg/dL (ref 8.7–10.3)
Chloride: 101 mmol/L (ref 96–106)
Creatinine, Ser: 1.08 mg/dL — ABNORMAL HIGH (ref 0.57–1.00)
Globulin, Total: 2.9 g/dL (ref 1.5–4.5)
Glucose: 125 mg/dL — ABNORMAL HIGH (ref 70–99)
Potassium: 3.5 mmol/L (ref 3.5–5.2)
Sodium: 142 mmol/L (ref 134–144)
Total Protein: 7.2 g/dL (ref 6.0–8.5)
eGFR: 55 mL/min/1.73 — ABNORMAL LOW

## 2024-12-23 LAB — LIPID PANEL
Chol/HDL Ratio: 2.3 ratio (ref 0.0–4.4)
Cholesterol, Total: 175 mg/dL (ref 100–199)
HDL: 75 mg/dL
LDL Chol Calc (NIH): 81 mg/dL (ref 0–99)
Triglycerides: 106 mg/dL (ref 0–149)
VLDL Cholesterol Cal: 19 mg/dL (ref 5–40)

## 2024-12-23 LAB — HEMOGLOBIN A1C
Est. average glucose Bld gHb Est-mCnc: 154 mg/dL
Hgb A1c MFr Bld: 7 % — ABNORMAL HIGH (ref 4.8–5.6)

## 2024-12-27 ENCOUNTER — Ambulatory Visit: Admitting: Family Medicine

## 2024-12-28 ENCOUNTER — Ambulatory Visit (HOSPITAL_COMMUNITY)
Admission: RE | Admit: 2024-12-28 | Discharge: 2024-12-28 | Disposition: A | Discharge status: Home / Self Care | Source: Ambulatory Visit | Attending: Family Medicine | Admitting: Family Medicine

## 2024-12-28 ENCOUNTER — Ambulatory Visit (HOSPITAL_COMMUNITY)

## 2024-12-28 ENCOUNTER — Encounter (HOSPITAL_COMMUNITY): Payer: Self-pay

## 2024-12-28 DIAGNOSIS — Z1231 Encounter for screening mammogram for malignant neoplasm of breast: Secondary | ICD-10-CM | POA: Diagnosis present

## 2025-01-05 ENCOUNTER — Ambulatory Visit (HOSPITAL_COMMUNITY)

## 2025-02-15 ENCOUNTER — Ambulatory Visit: Admitting: Family Medicine

## 2025-03-27 ENCOUNTER — Ambulatory Visit
